# Patient Record
Sex: Male | Born: 1935 | Race: White | Hispanic: No | Marital: Married | Smoking: Former smoker
Health system: Southern US, Community
[De-identification: ages and names within clinical notes are randomized; demographics above are authoritative.]

## PROBLEM LIST (undated history)

## (undated) DIAGNOSIS — R634 Abnormal weight loss: Secondary | ICD-10-CM

## (undated) DIAGNOSIS — K219 Gastro-esophageal reflux disease without esophagitis: Secondary | ICD-10-CM

## (undated) DIAGNOSIS — N400 Enlarged prostate without lower urinary tract symptoms: Secondary | ICD-10-CM

## (undated) DIAGNOSIS — K259 Gastric ulcer, unspecified as acute or chronic, without hemorrhage or perforation: Secondary | ICD-10-CM

## (undated) DIAGNOSIS — R251 Tremor, unspecified: Secondary | ICD-10-CM

## (undated) DIAGNOSIS — E785 Hyperlipidemia, unspecified: Secondary | ICD-10-CM

## (undated) DIAGNOSIS — I10 Essential (primary) hypertension: Secondary | ICD-10-CM

## (undated) DIAGNOSIS — G43909 Migraine, unspecified, not intractable, without status migrainosus: Secondary | ICD-10-CM

## (undated) DIAGNOSIS — M1990 Unspecified osteoarthritis, unspecified site: Secondary | ICD-10-CM

## (undated) DIAGNOSIS — J189 Pneumonia, unspecified organism: Secondary | ICD-10-CM

## (undated) DIAGNOSIS — N210 Calculus in bladder: Secondary | ICD-10-CM

## (undated) DIAGNOSIS — R319 Hematuria, unspecified: Secondary | ICD-10-CM

## (undated) DIAGNOSIS — T463X1A Poisoning by coronary vasodilators, accidental (unintentional), initial encounter: Secondary | ICD-10-CM

## (undated) DIAGNOSIS — I350 Nonrheumatic aortic (valve) stenosis: Secondary | ICD-10-CM

## (undated) DIAGNOSIS — N3090 Cystitis, unspecified without hematuria: Secondary | ICD-10-CM

## (undated) HISTORY — DX: Nonrheumatic aortic (valve) stenosis: I35.0

## (undated) HISTORY — DX: Benign prostatic hyperplasia without lower urinary tract symptoms: N40.0

## (undated) HISTORY — DX: Calculus in bladder: N21.0

## (undated) HISTORY — DX: Tremor, unspecified: R25.1

## (undated) HISTORY — DX: Abnormal weight loss: R63.4

## (undated) HISTORY — PX: CIRCUMCISION: SUR203

## (undated) HISTORY — DX: Hematuria, unspecified: R31.9

---
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
## 2011-10-07 ENCOUNTER — Ambulatory Visit (INDEPENDENT_AMBULATORY_CARE_PROVIDER_SITE_OTHER): Payer: MEDICARE | Admitting: Family Medicine

## 2011-10-07 ENCOUNTER — Encounter: Payer: Self-pay | Admitting: Family Medicine

## 2011-10-07 VITALS — BP 121/72 | HR 76 | Temp 98.0°F | Ht 66.0 in | Wt 156.0 lb

## 2011-10-07 DIAGNOSIS — N210 Calculus in bladder: Secondary | ICD-10-CM

## 2011-10-07 DIAGNOSIS — N400 Enlarged prostate without lower urinary tract symptoms: Secondary | ICD-10-CM

## 2011-10-07 DIAGNOSIS — Z0000 Encounter for general adult medical examination without abnormal findings: Secondary | ICD-10-CM

## 2011-10-07 DIAGNOSIS — R634 Abnormal weight loss: Secondary | ICD-10-CM | POA: Insufficient documentation

## 2011-10-07 DIAGNOSIS — Z298 Encounter for other specified prophylactic measures: Secondary | ICD-10-CM

## 2011-10-07 DIAGNOSIS — Z23 Encounter for immunization: Secondary | ICD-10-CM

## 2011-10-07 DIAGNOSIS — R011 Cardiac murmur, unspecified: Secondary | ICD-10-CM

## 2011-10-07 DIAGNOSIS — R319 Hematuria, unspecified: Secondary | ICD-10-CM

## 2011-10-07 DIAGNOSIS — K4020 Bilateral inguinal hernia, without obstruction or gangrene, not specified as recurrent: Secondary | ICD-10-CM | POA: Insufficient documentation

## 2011-10-07 HISTORY — DX: Benign prostatic hyperplasia without lower urinary tract symptoms: N40.0

## 2011-10-07 HISTORY — DX: Abnormal weight loss: R63.4

## 2011-10-07 HISTORY — DX: Hematuria, unspecified: R31.9

## 2011-10-07 HISTORY — DX: Calculus in bladder: N21.0

## 2011-10-07 NOTE — Addendum Note (Signed)
Addended by: Laren Boom on: 10/07/2011 11:55 AM   Modules accepted: Orders

## 2011-10-07 NOTE — Patient Instructions (Addendum)
Bring the stool cards in after picking them up from the lab, this looks for microscopic blood in the stool.  I'll let you know about your urine studies, this is looking for microscopic blood as well.  Get fasting blood work in the near future.  Lab is open from 8am-5pm.  Return in 4 weeks, I'll likely have your outside records by then.   Inguinal Hernia, Adult Muscles help keep everything in the body in its proper place. But if a weak spot in the muscles develops, something can poke through. That is called a hernia. When this happens in the lower part of the belly (abdomen), it is called an inguinal hernia. (It takes its name from a part of the body in this region called the inguinal canal.) A weak spot in the wall of muscles lets some fat or part of the small intestine bulge through. An inguinal hernia can develop at any age. Men get them more often than women. CAUSES  In adults, an inguinal hernia develops over time.  It can be triggered by:  Suddenly straining the muscles of the lower abdomen.  Lifting heavy objects.  Straining to have a bowel movement. Difficult bowel movements (constipation) can lead to this.  Constant coughing. This may be caused by smoking or lung disease.  Being overweight.  Being pregnant.  Working at a job that requires long periods of standing or heavy lifting.  Having had an inguinal hernia before. One type can be an emergency situation. It is called a strangulated inguinal hernia. It develops if part of the small intestine slips through the weak spot and cannot get back into the abdomen. The blood supply can be cut off. If that happens, part of the intestine may die. This situation requires emergency surgery. SYMPTOMS  Often, a small inguinal hernia has no symptoms. It is found when a healthcare provider does a physical exam. Larger hernias usually have symptoms.   In adults, symptoms may include:  A lump in the groin. This is easier to see when the  person is standing. It might disappear when lying down.  In men, a lump in the scrotum.  Pain or burning in the groin. This occurs especially when lifting, straining or coughing.  A dull ache or feeling of pressure in the groin.  Signs of a strangulated hernia can include:  A bulge in the groin that becomes very painful and tender to the touch.  A bulge that turns red or purple.  Fever, nausea and vomiting.  Inability to have a bowel movement or to pass gas. DIAGNOSIS  To decide if you have an inguinal hernia, a healthcare provider will probably do a physical examination.  This will include asking questions about any symptoms you have noticed.  The healthcare provider might feel the groin area and ask you to cough. If an inguinal hernia is felt, the healthcare provider may try to slide it back into the abdomen.  Usually no other tests are needed. TREATMENT  Treatments can vary. The size of the hernia makes a difference. Options include:  Watchful waiting. This is often suggested if the hernia is small and you have had no symptoms.  No medical procedure will be done unless symptoms develop.  You will need to watch closely for symptoms. If any occur, contact your healthcare provider right away.  Surgery. This is used if the hernia is larger or you have symptoms.  Open surgery. This is usually an outpatient procedure (you will not stay  overnight in a hospital). An cut (incision) is made through the skin in the groin. The hernia is put back inside the abdomen. The weak area in the muscles is then repaired by herniorrhaphy or hernioplasty. Herniorrhaphy: in this type of surgery, the weak muscles are sewn back together. Hernioplasty: a patch or mesh is used to close the weak area in the abdominal wall.  Laparoscopy. In this procedure, a surgeon makes small incisions. A thin tube with a tiny video camera (called a laparoscope) is put into the abdomen. The surgeon repairs the hernia  with mesh by looking with the video camera and using two long instruments. HOME CARE INSTRUCTIONS   After surgery to repair an inguinal hernia:  You will need to take pain medicine prescribed by your healthcare provider. Follow all directions carefully.  You will need to take care of the wound from the incision.  Your activity will be restricted for awhile. This will probably include no heavy lifting for several weeks. You also should not do anything too active for a few weeks. When you can return to work will depend on the type of job that you have.  During "watchful waiting" periods, you should:  Maintain a healthy weight.  Eat a diet high in fiber (fruits, vegetables and whole grains).  Drink plenty of fluids to avoid constipation. This means drinking enough water and other liquids to keep your urine clear or pale yellow.  Do not lift heavy objects.  Do not stand for long periods of time.  Quit smoking. This should keep you from developing a frequent cough. SEEK MEDICAL CARE IF:   A bulge develops in your groin area.  You feel pain, a burning sensation or pressure in the groin. This might be worse if you are lifting or straining.  You develop a fever of more than 100.5 F (38.1 C). SEEK IMMEDIATE MEDICAL CARE IF:   Pain in the groin increases suddenly.  A bulge in the groin gets bigger suddenly and does not go down.  For men, there is sudden pain in the scrotum. Or, the size of the scrotum increases.  A bulge in the groin area becomes red or purple and is painful to touch.  You have nausea or vomiting that does not go away.  You feel your heart beating much faster than normal.  You cannot have a bowel movement or pass gas.  You develop a fever of more than 102.0 F (38.9 C). Document Released: 05/08/2008 Document Revised: 03/14/2011 Document Reviewed: 05/08/2008 [facility] Patient Information 2013 [place], [place].

## 2011-10-07 NOTE — Progress Notes (Signed)
CC: Roberto VOLLBRECHT Sr. is a 78 y.o. male is here for Establish Care and Weight Loss   Subjective: HPI:  Pleasant 78 year old here to establish care accompanied by his son, recently relocated to [place], living with his son, wife, and daughter-in-law.  They're unsure of his entire past medical history that includes BPH, osteoarthritis, and inguinal hernias.  He mentions that he's lost 30-40 pounds since August which is the most part been unintentional. He states his diet has been greatly changed as he's cut out sweets and treats it has been irregularly eating during the transition from [place] and since she's been living with his son. He denies any change in exercise habits. Overall he states his energy level is great and denies any exacerbation of his chronic osteoarthritis. He denies any new pain over the past months. He denies any discomfort when eating. There's been no diarrhea or constipation or abdominal bloating.  Schedule he mentions that about 6 weeks ago he had 16 hours of gross hematuria that resolved after stopping Celebrex. He tells me he carries a diagnosis of BPH but has never had a biopsy, he believes she's had many studies to rule out cancer of the prostate is unsure of what these studies were. He tells me at night he gets up multiple times to the often having trouble initiating a stream and occasionally has a burning sensation when urinating. During the day he denies any straining, sensation of incomplete voiding, nor dysuria. Tells me has a history of stones in his bladder but has never had nephrolithiasis to his knowledge.  To 3 months ago he noticed a bulge in his left groin that would increase when lifting weights or straining such as when having a bowel movement. Soon after noticing that a bulge involved in his left groin which she states comes and goes based on how active he is during the day. He is able to reduce this without difficulty but since he has no pain from either  side he rarely manipulates these bulges. He denies testicular pain nor enlargement or scrotal swelling. He was told that his former physician's office that he might have a hernia but was unable to find a surgeon that would operate on him before the move.  Patient denies fevers, chills, cough, shortness of breath, orthopnea, irregular heartbeat, motor sensory disturbances, headaches, depression, anxiety, stomach pain, blood in stool, garlic stools, rashes, nor confusion.   Review Of Systems Outlined In HPI  Past Medical History  Diagnosis Date  . Unintentional weight loss 10/07/2011  . BPH (benign prostatic hyperplasia) 10/07/2011  . Hematuria 10/07/2011  . Bladder stone 10/07/2011     Family History  Problem Relation Age of Onset  . Heart attack Mother   . Stroke Mother      History  Substance Use Topics  . Smoking status: Not on file  . Smokeless tobacco: Not on file  . Alcohol Use: No     Objective: Filed Vitals:   10/07/11 1046  BP: 121/72  Pulse: 76  Temp: 98 F (36.7 C)    General: Alert and Oriented, No Acute Distress HEENT: Pupils equal, round, reactive to light. Conjunctivae clear.  External ears unremarkable, canals clear with intact TMs with appropriate landmarks.  Middle ear appears open without effusion. Pink inferior turbinates.  Moist mucous membranes, pharynx without inflammation nor lesions.  Neck supple without palpable lymphadenopathy nor abnormal masses. Lungs: Clear to auscultation bilaterally, no wheezing/ronchi/rales.  Comfortable work of breathing. Good air movement. Cardiac: Regular  rate and rhythm. Normal S1/S2.  Crescendo decrescendo systolic murmur grade 2 heard best over the apex of the heart. No carotid bruits Abdomen: Normal bowel sounds, soft and non tender without palpable masses. Genitourinary: Symmetric nontender testicles without nodularity bilaterally. Soft reducible hernia on the left greater than right but does not appear to be tracking in  the inguinal canal Extremities: No peripheral edema.  Strong peripheral pulses.  Mental Status: No depression, anxiety, nor agitation. Skin: Warm and dry.  Assessment & Plan: Roberto Day was seen today for establish care and weight loss.  Diagnoses and associated orders for this visit:  Need for prophylactic immunotherapy - Flu vaccine greater than or equal to 3yo preservative free IM  Hematuria - Urinalysis, Routine w reflex microscopic - Urine culture  Inguinal hernia, bilateral - Ambulatory referral to General Surgery  Routine health maintenance - Lipid panel - COMPLETE METABOLIC PANEL WITH GFR  Unintentional weight loss - PSA - Guiac Stool Card-TAKE HOME  Heart murmur  Expressed my concern with his unintentional weight loss in the setting of gross hematuria. He tells me a colonoscopy 3 years ago which was reportedly normal as was multiple screening colonoscopies in the past. Urinalysis and culture obtained to determine if hematuria is present and possibility of urinary tract infection. Stool cards given to screen for colorectal cancer. PSA to screen for prostate cancer. Complete blood panel ordered. Lipid panel or patient request he believes it's been years since his cholesterol checked. Urgent surgical referral for bilateral inguinal hernias discussed signs and symptoms that would require emergent evaluation. Pointed out his heart murmur which he states he was never told that he has, given that he is a symptomatic focus our efforts on the above issues before deciding whether or not to get an echocardiogram.  Return in 4 weeks once outside records are available  45 minutes spent in face-to-face visit today of which at least 90 % was counseling or coordinating care.     Return in about 4 weeks (around 11/04/2011).

## 2011-10-08 LAB — LIPID PANEL
Cholesterol: 124 mg/dL (ref 0–200)
LDL Cholesterol: 61 mg/dL (ref 0–99)
Total CHOL/HDL Ratio: 3 Ratio
VLDL: 22 mg/dL (ref 0–40)

## 2011-10-08 LAB — URINALYSIS, ROUTINE W REFLEX MICROSCOPIC
Ketones, ur: NEGATIVE mg/dL
Nitrite: NEGATIVE
Specific Gravity, Urine: 1.015 (ref 1.005–1.030)
Urobilinogen, UA: 0.2 mg/dL (ref 0.0–1.0)

## 2011-10-08 LAB — COMPLETE METABOLIC PANEL WITH GFR
ALT: 10 U/L (ref 0–53)
AST: 16 U/L (ref 0–37)
Albumin: 3.5 g/dL (ref 3.5–5.2)
Alkaline Phosphatase: 111 U/L (ref 39–117)
BUN: 21 mg/dL (ref 6–23)
Calcium: 8.8 mg/dL (ref 8.4–10.5)
Chloride: 103 mEq/L (ref 96–112)
Creat: 1.21 mg/dL (ref 0.50–1.35)
Potassium: 4.2 mEq/L (ref 3.5–5.3)

## 2011-10-08 LAB — URINALYSIS, MICROSCOPIC ONLY
Casts: NONE SEEN
Squamous Epithelial / LPF: NONE SEEN
WBC, UA: >50 WBC/hpf — AB (ref <3)

## 2011-10-08 LAB — PSA: PSA: 7.65 ng/mL — ABNORMAL HIGH (ref <=4.00)

## 2011-10-10 ENCOUNTER — Telehealth: Payer: Self-pay | Admitting: Family Medicine

## 2011-10-10 DIAGNOSIS — N400 Enlarged prostate without lower urinary tract symptoms: Secondary | ICD-10-CM

## 2011-10-10 DIAGNOSIS — M25562 Pain in left knee: Secondary | ICD-10-CM

## 2011-10-10 DIAGNOSIS — R972 Elevated prostate specific antigen [PSA]: Secondary | ICD-10-CM

## 2011-10-10 DIAGNOSIS — N390 Urinary tract infection, site not specified: Secondary | ICD-10-CM

## 2011-10-10 LAB — URINE CULTURE: Colony Count: >=100000

## 2011-10-10 NOTE — Progress Notes (Signed)
  Subjective:    Patient ID: Roberto Dupes., male    DOB: 05-Apr-1921, 78 y.o.   MRN: [id number]  HPI    Review of Systems     Objective:   Physical Exam        Assessment & Plan:  Pt's take home stool cards were negative for blood.

## 2011-10-10 NOTE — Telephone Encounter (Signed)
Contacted patient's family, he wasn't available as he's turning in his stool cards now. I'll try him again tomorrow when all his results should be back.

## 2011-10-11 ENCOUNTER — Encounter: Payer: Self-pay | Admitting: Family Medicine

## 2011-10-11 DIAGNOSIS — R972 Elevated prostate specific antigen [PSA]: Secondary | ICD-10-CM | POA: Insufficient documentation

## 2011-10-11 MED ORDER — SULFAMETHOXAZOLE-TRIMETHOPRIM 800-160 MG PO TABS: 1.0000 | ORAL_TABLET | Freq: Two times a day (BID) | ORAL | Status: AC

## 2011-10-11 NOTE — Telephone Encounter (Signed)
Contacted patient: Informed of urine studies suspicious for urinary tract infection Bactrim Rx will be placed a front desk. Patient states he was seen by a Dr. Kateri Day who is a urologist in August and given diagnosis of BPH without concern for prostate cancer. Roberto Day is open to urology appointment here in [facility] given his BPH history and a recent elevated PSA. We'll place a referral I like to get a repeat PSA after clearing his presumed urinary tract infection. He has an appointment in early November. Per his request he would like an orthopedic referral for evaluation of left knee pain.

## 2011-10-12 ENCOUNTER — Encounter: Payer: Self-pay | Admitting: *Deleted

## 2011-10-12 DIAGNOSIS — E785 Hyperlipidemia, unspecified: Secondary | ICD-10-CM | POA: Insufficient documentation

## 2011-10-14 ENCOUNTER — Telehealth: Payer: Self-pay | Admitting: *Deleted

## 2011-10-14 NOTE — Telephone Encounter (Signed)
Sounds like a foley needs to be placed, I'd bet he has a bladder outlet obstruction.  We do not have the equipment to handle that here unfortunately, therefore I'd recommend him go to a local ED to have his bladder decompressed.

## 2011-10-14 NOTE — Telephone Encounter (Signed)
Given Septra on Monday for bladder infection. Pt can not urinate at all. Last time went was 6:30pm last night. Urology appt is next Wednesday and pt states he can not wait. Denies fever and chills just

## 2011-10-14 NOTE — Telephone Encounter (Signed)
Pt notified to go to ED

## 2011-10-17 ENCOUNTER — Ambulatory Visit (INDEPENDENT_AMBULATORY_CARE_PROVIDER_SITE_OTHER): Payer: MEDICARE | Admitting: Family Medicine

## 2011-10-17 ENCOUNTER — Encounter: Payer: Self-pay | Admitting: Family Medicine

## 2011-10-17 ENCOUNTER — Other Ambulatory Visit (HOSPITAL_COMMUNITY)
Admission: RE | Admit: 2011-10-17 | Discharge: 2011-10-17 | Disposition: A | Discharge status: Home / Self Care | Payer: MEDICARE | Source: Ambulatory Visit | Attending: Family Medicine | Admitting: Family Medicine

## 2011-10-17 VITALS — BP 114/68 | HR 87 | Temp 97.6°F | Resp 21 | Wt 154.0 lb

## 2011-10-17 DIAGNOSIS — E785 Hyperlipidemia, unspecified: Secondary | ICD-10-CM

## 2011-10-17 DIAGNOSIS — R310 Gross hematuria: Secondary | ICD-10-CM | POA: Insufficient documentation

## 2011-10-17 DIAGNOSIS — N390 Urinary tract infection, site not specified: Secondary | ICD-10-CM

## 2011-10-17 DIAGNOSIS — M549 Dorsalgia, unspecified: Secondary | ICD-10-CM | POA: Insufficient documentation

## 2011-10-17 DIAGNOSIS — R911 Solitary pulmonary nodule: Secondary | ICD-10-CM

## 2011-10-17 DIAGNOSIS — H409 Unspecified glaucoma: Secondary | ICD-10-CM | POA: Insufficient documentation

## 2011-10-17 DIAGNOSIS — G8929 Other chronic pain: Secondary | ICD-10-CM

## 2011-10-17 DIAGNOSIS — M069 Rheumatoid arthritis, unspecified: Secondary | ICD-10-CM | POA: Insufficient documentation

## 2011-10-17 DIAGNOSIS — M8580 Other specified disorders of bone density and structure, unspecified site: Secondary | ICD-10-CM | POA: Insufficient documentation

## 2011-10-17 NOTE — Patient Instructions (Signed)
Take the cipro, you do not need to take the bactrim (TMP/SMX).  Take Flomax on a daily basis to help with relaxing the prostate.  Stop taking Cardura, this is probably causing lightheadedness.

## 2011-10-17 NOTE — Progress Notes (Addendum)
CC: Roberto Day. is a 78 y.o. male is here for Follow up hospital   Subjective: HPI:  Patient presents for ED followup. He was seen at [facility] this past Friday for an inability urinate and required a Foley catheter placement which she still has been placed today. I spoke with the patient earlier in the week about a staph urinary tract infection and he was approximately day 3 of a Bactrim regimen. He brings labs from the hospital showed a hemoglobin of 13.2 platelets of 214 and a white count of 10.3. Urine microscopy shows 3-6 red blood cells on high-power field compared to the 0-2 report that I had obtained earlier in the week. CT renal stone study was performed showing an enlarged prostate and a noncalcified pleural-based right pulmonary nodule a 3 mm size. He was started on Cipro twice a day and Flomax. He tells me that soon after taking the Flomax he often feels lightheaded, he also points out that he's been taking Cardura that's not initially on his med list. He's noticed that his urine became red and brown sometime around Sunday. He states this is not uncommon for him to have bloody urine and that it'll likely go away as it has in the past without any intervention. He tells me he seen urologist in the past for this and everything "checked out ". He has an appointment with urology later this month and it set up do to an elevated PSA with a history of unintentional weight loss in the setting of episodic gross hematuria.  He denies shortness of breath, chest pain, irregular heartbeat, bleeding from the penile meatus, testicular pain, groin pain, fevers, chills, nausea, bowel irregularities. He does note some penile pain but states this is only present when the Foley is manipulated.   Review Of Systems Outlined In HPI  Past Medical History  Diagnosis Date  . Unintentional weight loss 10/07/2011  . BPH (benign prostatic hyperplasia) 10/07/2011  . Hematuria 10/07/2011  . Bladder  stone 10/07/2011     Family History  Problem Relation Age of Onset  . Heart attack Mother   . Stroke Mother      History  Substance Use Topics  . Smoking status: Not on file  . Smokeless tobacco: Not on file  . Alcohol Use: No     Objective: Filed Vitals:   10/17/11 0955  BP: 114/68  Pulse: 87  Temp: 97.6 F (36.4 C)  Resp: 21    General: Alert and Oriented, No Acute Distress HEENT: Pupils equal, round, reactive to light. Conjunctivae clear.   Moist mucous membranes, Lungs: Clear to auscultation bilaterally, Comfortable work of breathing. Good air movement. Cardiac: Regular rate and rhythm. Normal S1/S2.  No murmurs, rubs, nor gallops.   Extremities: No peripheral edema.  Strong peripheral pulses.  Foley bag attached to the right leg containing brown/red urine without gross evidence of clots. Mental Status: No depression, anxiety, nor agitation. Skin: Warm and dry.  Assessment & Plan: Gevork was seen today for follow up hospital.  Diagnoses and associated orders for this visit:  Pulmonary nodule, left  Uti (lower urinary tract infection)  Gross hematuria - Cytology - non gyn  Pulmonary nodule seen on imaging study (repeat ct oct 2014)  Other Orders - ciprofloxacin (CIPRO) 500 MG tablet; Take 500 mg by mouth 2 (two) times daily. - Tamsulosin HCl (FLOMAX) 0.4 MG CAPS; Take 0.4 mg by mouth daily.    Patient initially requested Foley removed however since  we don't have a replacement Foley if he fails a trial without Foley and he would have to go to the emergency room he prefer to wait until urology appointment which we arranged for this coming Friday. Stressed importance of him continuing Cipro, he can stop Bactrim. He should continue Flomax but should stop Cardura do to what I suspect is some mild hypertension. I have sent his urine for cytology given his elevated PSA, hematuria, unintentional weight loss.Signs and symptoms requring emergent/urgent reevaluation were  discussed with the patient.  25 minutes spent in face-to-face visit today of which at least 75% was counseling or coordinating care.   Return in about 4 weeks (around 11/14/2011).

## 2011-10-19 ENCOUNTER — Ambulatory Visit (INDEPENDENT_AMBULATORY_CARE_PROVIDER_SITE_OTHER): Payer: MEDICARE

## 2011-10-19 ENCOUNTER — Other Ambulatory Visit: Payer: Self-pay | Admitting: Sports Medicine

## 2011-10-19 DIAGNOSIS — M25562 Pain in left knee: Secondary | ICD-10-CM

## 2011-10-19 DIAGNOSIS — M25569 Pain in unspecified knee: Secondary | ICD-10-CM

## 2011-10-21 ENCOUNTER — Encounter: Payer: Self-pay | Admitting: Family Medicine

## 2011-10-24 ENCOUNTER — Ambulatory Visit (INDEPENDENT_AMBULATORY_CARE_PROVIDER_SITE_OTHER): Payer: MEDICARE | Admitting: Surgery

## 2011-11-02 ENCOUNTER — Ambulatory Visit (INDEPENDENT_AMBULATORY_CARE_PROVIDER_SITE_OTHER): Payer: MEDICARE | Admitting: Surgery

## 2011-11-02 ENCOUNTER — Encounter (INDEPENDENT_AMBULATORY_CARE_PROVIDER_SITE_OTHER): Payer: Self-pay | Admitting: Surgery

## 2011-11-02 VITALS — BP 123/70 | HR 88 | Temp 97.6°F | Resp 18 | Ht 67.0 in | Wt 167.4 lb

## 2011-11-02 DIAGNOSIS — K4020 Bilateral inguinal hernia, without obstruction or gangrene, not specified as recurrent: Secondary | ICD-10-CM

## 2011-11-02 NOTE — Progress Notes (Signed)
Patient ID: Roberto Day., male   DOB: 12/05/1921, 78 y.o.   MRN: [id number]  Chief Complaint  Patient presents with  . Hernia    HPI Roberto CLOVER Sr. is a 78 y.o. male.  Referred by Dr. Ivan Anchors for evaluation of bilateral inguinal hernias HPI This is an 78 year old male who recently moved to [place] from [place]. Prior to his move, he was diagnosed with bilateral inguinal hernias. In retrospect he has had a left inguinal hernia for at least 3 years. The left side has become larger and mildly uncomfortable. He had an emergency department visit in August with a right inguinal hernia that sounds like it was incarcerated. It was successfully reduced. He has established new care here in [place] with Dr. Ivan Anchors who refers him for evaluation for hernia repair. Previously the patient was quite active and enjoys playing golf. He has had a lot of problems with his left knee as well as his hernias which is keeping him from playing golf. He is fairly independent. He denies any obstructive symptoms. He has had some problems with urinary retention but this seems to be improved.  Past Medical History  Diagnosis Date  . Unintentional weight loss 10/07/2011  . BPH (benign prostatic hyperplasia) 10/07/2011  . Hematuria 10/07/2011  . Bladder stone 10/07/2011    PSH:  Circumcision Ear surgery   Family History  Problem Relation Age of Onset  . Heart attack Mother   . Stroke Mother     Social History History  Substance Use Topics  . Smoking status: Former Games developer  . Smokeless tobacco: Not on file  . Alcohol Use: Yes     rarly beer    Allergies  Allergen Reactions  . Codeine Nausea Only    Current Outpatient Prescriptions  Medication Sig Dispense Refill  . betaxolol (BETOPTIC-S) 0.25 % ophthalmic suspension Place 1 drop into both eyes 2 (two) times daily.      . celecoxib (CELEBREX) 200 MG capsule Take 200 mg by mouth daily as needed.      . ciprofloxacin (CIPRO) 500 MG  tablet Take 500 mg by mouth 2 (two) times daily.      Marland Kitchen gabapentin (NEURONTIN) 300 MG capsule Take 300 mg by mouth 3 (three) times daily.      Marland Kitchen HYDROcodone-acetaminophen (VICODIN) 5-500 MG per tablet Take 1 tablet by mouth every 6 (six) hours as needed.      . Multiple Vitamins-Minerals (CENTRUM SILVER PO) Take by mouth.      . Naproxen Sodium (ALEVE PO) Take by mouth.      . Potassium Gluconate 550 MG TABS Take 1 tablet by mouth daily.      Marland Kitchen RANITIDINE HCL PO Take by mouth.      . Saw Palmetto, Serenoa repens, (SAW PALMETTO PO) Take by mouth.      . Tamsulosin HCl (FLOMAX) 0.4 MG CAPS Take 0.4 mg by mouth daily.      . traMADol (ULTRAM) 50 MG tablet Take 50 mg by mouth every 6 (six) hours as needed.        Review of Systems Review of Systems  Constitutional: Positive for unexpected weight change. Negative for fever and chills.  HENT: Positive for hearing loss. Negative for congestion, sore throat, trouble swallowing and voice change.   Eyes: Negative for visual disturbance.  Respiratory: Negative for cough and wheezing.   Cardiovascular: Positive for leg swelling. Negative for chest pain and palpitations.  Gastrointestinal: Negative for nausea, vomiting,  abdominal pain, diarrhea, constipation, blood in stool, abdominal distention, anal bleeding and rectal pain.  Genitourinary: Negative for hematuria and difficulty urinating.  Musculoskeletal: Positive for joint swelling and arthralgias.  Skin: Negative for rash and wound.  Neurological: Negative for seizures, syncope, weakness and headaches.  Hematological: Negative for adenopathy. Does not bruise/bleed easily.  Psychiatric/Behavioral: Negative for confusion.    Blood pressure 123/70, pulse 88, temperature 97.6 F (36.4 C), temperature source Temporal, resp. rate 18, height 5\' 7"  (1.702 m), weight 167 lb 6.4 oz (75.932 kg).  Physical Exam Physical Exam Elderly male in NAD HEENT:  EOMI, sclera anicteric Neck:  No masses, no  thyromegaly Lungs:  CTA bilaterally; normal respiratory effort CV:  Regular rate and rhythm; heart murmur Abd:  +bowel sounds, soft, non-tender, no masses GU: bilateral descended testes; no testicular masses; large left inguinal hernia - reducible; moderate-sized right inguinal hernia - reducible Ext:  Well-perfused; no edema Skin:  Warm, dry; no sign of jaundice  Data Reviewed none  Assessment    Bilateral inguinal hernias    Plan    Bilateral inguinal hernia repairs with mesh.  The surgical procedure has been discussed with the patient.  Potential risks, benefits, alternative treatments, and expected outcomes have been explained.  All of the patient's questions at this time have been answered.  The likelihood of reaching the patient's treatment goal is good.  The patient understand the proposed surgical procedure and wishes to proceed. We will plan on an overnight stay due to his age.       [name] 11/02/2011, 5:55 PM

## 2011-11-04 ENCOUNTER — Encounter: Payer: Self-pay | Admitting: Family Medicine

## 2011-11-04 ENCOUNTER — Ambulatory Visit (INDEPENDENT_AMBULATORY_CARE_PROVIDER_SITE_OTHER): Payer: MEDICARE | Admitting: Family Medicine

## 2011-11-04 VITALS — BP 129/72 | HR 72 | Ht 66.0 in | Wt 168.0 lb

## 2011-11-04 DIAGNOSIS — M899 Disorder of bone, unspecified: Secondary | ICD-10-CM

## 2011-11-04 DIAGNOSIS — Z0000 Encounter for general adult medical examination without abnormal findings: Secondary | ICD-10-CM

## 2011-11-04 DIAGNOSIS — H9190 Unspecified hearing loss, unspecified ear: Secondary | ICD-10-CM

## 2011-11-04 DIAGNOSIS — M8580 Other specified disorders of bone density and structure, unspecified site: Secondary | ICD-10-CM

## 2011-11-04 MED ORDER — ATORVASTATIN CALCIUM 10 MG PO TABS: 10.0000 mg | ORAL_TABLET | Freq: Every day | ORAL | Status: DC

## 2011-11-04 MED ORDER — BETAXOLOL HCL 0.25 % OP SUSP: 1.0000 [drp] | Freq: Two times a day (BID) | OPHTHALMIC | Status: AC

## 2011-11-04 MED ORDER — POTASSIUM GLUCONATE 550 MG PO TABS: 1.0000 | ORAL_TABLET | Freq: Every day | ORAL | Status: AC

## 2011-11-04 MED ORDER — TAMSULOSIN HCL 0.4 MG PO CAPS: 0.4000 mg | ORAL_CAPSULE | Freq: Every day | ORAL | Status: DC

## 2011-11-04 MED ORDER — HYDROCODONE-ACETAMINOPHEN 5-500 MG PO TABS: 1.0000 | ORAL_TABLET | Freq: Four times a day (QID) | ORAL | Status: DC | PRN

## 2011-11-04 MED ORDER — GABAPENTIN 300 MG PO CAPS: 300.0000 mg | ORAL_CAPSULE | Freq: Three times a day (TID) | ORAL | Status: DC

## 2011-11-04 NOTE — Progress Notes (Signed)
Subjective:    Roberto BINFORD Sr. is a 78 y.o. male who presents for Medicare Annual/Subsequent preventive examination.   Preventive Screening-Counseling & Management  Tobacco History  Smoking status  . Former Smoker  Smokeless tobacco  . Not on file    Problems Prior to Visit 1.  See Below  Current Problems (verified) Patient Active Problem List  Diagnosis  . Hematuria  . Inguinal hernia, bilateral  . Unintentional weight loss  . Osteoarthritis  . BPH (benign prostatic hyperplasia)  . Bladder stone  . Heart murmur  . Elevated PSA (6.1 05/2011), (6.5 07/2011)  . Left knee pain  . Other and unspecified hyperlipidemia  . Pulmonary nodule, left  . UTI (lower urinary tract infection)  . Pulmonary nodule seen on imaging study (Repeat CT Oct 2014)  . Hyperlipidemia  . Osteopenia (Femur T= -1.7 09/2009)  . Chronic back pain  . Rheumatoid arthritis (per outside records)  . Glaucoma  . CKD (chronic kidney disease) stage 3, GFR 30-59 ml/min    Medications Prior to Visit Current Outpatient Prescriptions on File Prior to Visit  Medication Sig Dispense Refill  . atorvastatin (LIPITOR) 10 MG tablet Take 1 tablet (10 mg total) by mouth daily.  90 tablet  1  . ciprofloxacin (CIPRO) 500 MG tablet Take 500 mg by mouth 2 (two) times daily.      Marland Kitchen doxazosin (CARDURA) 8 MG tablet Take 8 mg by mouth at bedtime.      . Multiple Vitamins-Minerals (CENTRUM SILVER PO) Take by mouth.      . Naproxen Sodium (ALEVE PO) Take by mouth.      Marland Kitchen RANITIDINE HCL PO Take by mouth.      . Saw Palmetto, Serenoa repens, (SAW PALMETTO PO) Take by mouth.      . traMADol (ULTRAM) 50 MG tablet Take 50 mg by mouth every 6 (six) hours as needed.      Marland Kitchen DISCONTD: gabapentin (NEURONTIN) 300 MG capsule Take 300 mg by mouth 3 (three) times daily.      Marland Kitchen DISCONTD: Potassium Gluconate 550 MG TABS Take 1 tablet by mouth daily.      . celecoxib (CELEBREX) 200 MG capsule Take 200 mg by mouth daily as needed.         Current Medications (verified) Current Outpatient Prescriptions  Medication Sig Dispense Refill  . atorvastatin (LIPITOR) 10 MG tablet Take 1 tablet (10 mg total) by mouth daily.  90 tablet  1  . betaxolol (BETOPTIC-S) 0.25 % ophthalmic suspension Place 1 drop into both eyes 2 (two) times daily.  5 mL  3  . ciprofloxacin (CIPRO) 500 MG tablet Take 500 mg by mouth 2 (two) times daily.      Marland Kitchen doxazosin (CARDURA) 8 MG tablet Take 8 mg by mouth at bedtime.      . gabapentin (NEURONTIN) 300 MG capsule Take 1 capsule (300 mg total) by mouth 3 (three) times daily.  90 capsule  6  . HYDROcodone-acetaminophen (VICODIN) 5-500 MG per tablet Take 1 tablet by mouth every 6 (six) hours as needed.  60 tablet  0  . Multiple Vitamins-Minerals (CENTRUM SILVER PO) Take by mouth.      . Naproxen Sodium (ALEVE PO) Take by mouth.      . Potassium Gluconate 550 MG TABS Take 1 tablet (550 mg total) by mouth daily.  90 each  1  . RANITIDINE HCL PO Take by mouth.      . Saw Palmetto, Serenoa repens, (SAW  PALMETTO PO) Take by mouth.      . Tamsulosin HCl (FLOMAX) 0.4 MG CAPS Take 1 capsule (0.4 mg total) by mouth daily.  90 capsule  1  . traMADol (ULTRAM) 50 MG tablet Take 50 mg by mouth every 6 (six) hours as needed.      Marland Kitchen DISCONTD: atorvastatin (LIPITOR) 10 MG tablet Take 10 mg by mouth daily.      Marland Kitchen DISCONTD: gabapentin (NEURONTIN) 300 MG capsule Take 300 mg by mouth 3 (three) times daily.      Marland Kitchen DISCONTD: Potassium Gluconate 550 MG TABS Take 1 tablet by mouth daily.      . celecoxib (CELEBREX) 200 MG capsule Take 200 mg by mouth daily as needed.         Allergies (verified) Codeine   PAST HISTORY  Family History Family History  Problem Relation Age of Onset  . Heart attack Mother   . Stroke Mother     Social History History  Substance Use Topics  . Smoking status: Former Games developer  . Smokeless tobacco: Not on file  . Alcohol Use: Yes     rarly beer    Are there smokers in your home (other  than you)?  No  Risk Factors Current exercise habits: The patient does not participate in regular exercise at present.  Dietary issues discussed: Low Salt and Fat Diet  Cardiac risk factors: advanced age (older than 67 for men, 55 for women), male gender and sedentary lifestyle.  Depression Screen (Note: if answer to either of the following is "Yes", a more complete depression screening is indicated)   Q1: Over the past two weeks, have you felt down, depressed or hopeless? No  Q2: Over the past two weeks, have you felt little interest or pleasure in doing things? No  Have you lost interest or pleasure in daily life? No  Do you often feel hopeless? No  Do you cry easily over simple problems? No  Activities of Daily Living In your present state of health, do you have any difficulty performing the following activities?:  Driving? No Managing money?  No Feeding yourself? No Getting from bed to chair? No Climbing a flight of stairs? No Preparing food and eating?: No Bathing or showering? No Getting dressed: No Getting to the toilet? No Using the toilet:No Moving around from place to place: No In the past year have you fallen or had a near fall?:No   Are you sexually active?  No  Do you have more than one partner?  No  Hearing Difficulties: Yes Do you often ask people to speak up or repeat themselves? Yes Do you experience ringing or noises in your ears? No Do you have difficulty understanding soft or whispered voices? Yes   Do you feel that you have a problem with memory? Yes  Do you often misplace items? No  Do you feel safe at home?  Yes  Cognitive Testing  Clock Draw: Perfect  Three Word Recall: Perfect  Advanced Directives have been discussed with the patient? Yes   List the Names of Other Physician/Practitioners you currently use: 1.  Dr. Penni Bombard - Ortho 2. Dr. Lucia Gaskins - Urology Please see snapshot for additional physicians  Indicate any recent Medical Services  you may have received from other than [facility] providers in the past year (date may be approximate).  Immunization History  Administered Date(s) Administered  . Influenza Split 10/07/2011  . Pneumococcal Conjugate 01/04/2003  . Td 01/04/2007    Screening Tests  Health Maintenance  Topic Date Due  . Tetanus/tdap  09/04/1942  . Colonoscopy  09/03/1973  . Zostavax  09/04/1983  . Pneumococcal Polysaccharide Vaccine Age 51 And Over  09/03/1988  . Influenza Vaccine  09/03/2012    All answers were reviewed with the patient and necessary referrals were made:  Laren Boom, DO   11/04/2011   History reviewed: allergies, current medications, past family history, past medical history, past social history, past surgical history and problem list  Review of Systems Review of Systems - General ROS: negative for - chills, fever, night sweats, weight gain or weight loss Ophthalmic ROS: negative for - decreased vision Psychological ROS: negative for - anxiety or depression ENT ROS: negative for -nasal congestion, tinnitus or allergies Hematological and Lymphatic ROS: negative for - bleeding problems, bruising or swollen lymph nodes Breast ROS: negative Respiratory ROS: no cough, shortness of breath, or wheezing Cardiovascular ROS: no chest pain or dyspnea on exertion Gastrointestinal ROS: no abdominal pain, change in bowel habits, or black or bloody stools Genito-Urinary ROS: negative for - genital discharge, genital ulcers, incontinence or abnormal bleeding from genitals Musculoskeletal ROS: negative for - joint pain or muscle pain Neurological ROS: negative for - headaches or memory loss Dermatological ROS: negative for lumps, mole changes, rash and skin lesion changes   Objective:     Vision by Snellen chart: right eye:20/20, left eye:20/20 Blood pressure 129/72, pulse 72, height 5\' 6"  (1.676 m), weight 168 lb (76.204 kg), SpO2 99.00%. Body mass index is 27.12 kg/(m^2).  General: No Acute  Distress HEENT: Atraumatic, normocephalic, conjunctivae normal without scleral icterus.  No nasal discharge, hearing grossly intact, TMs with good landmarks bilaterally with no middle ear abnormalities, posterior pharynx clear without oral lesions. Neck: Supple, trachea midline, no cervical nor supraclavicular adenopathy. Pulmonary: Clear to auscultation bilaterally without wheezing, rhonchi, nor rales. Cardiac: Regular rate and rhythm.  No murmurs, rubs, nor gallops. No peripheral edema.  2+ peripheral pulses bilaterally. Abdomen: Bowel sounds normal.  No masses.  Non-tender without rebound.  Negative Murphy's sign. MSK: Grossly intact, no signs of weakness.  Full strength throughout upper and lower extremities.  Full ROM in upper and lower extremities.  No midline spinal tenderness. Neuro: Gait unremarkable, CN II-XII grossly intact.  C5-C6 Reflex 2/4 Bilaterally, L4 Reflex 2/4 Bilaterally.  Cerebellar function intact. Skin: No rashes. Psych: Alert and oriented to person/place/time.  Thought process normal. No anxiety/depression.     Assessment:    Hearing Loss, Hx of osteopenia     Plan:     During the course of the visit the patient was educated and counseled about appropriate screening and preventive services including:    Is currently seeing urology for prostate management, no indication for colonoscopy noting his age, we did order a DEXA scan given his history of osteopenia in 2011. He started his flu shot, he already had pneumonia vaccine after 65, he is positive his tetanus booster was within the last 10 years. Audiology referral for maintenance of his hearing aids  Diet review for nutrition referral?  Not Indicated   Depression screen negative, no need for further testing.  Patient Instructions (the written plan) was given to the patient.  Medicare Attestation I have personally reviewed: The patient's medical and social history Their use of alcohol, tobacco or illicit  drugs Their current medications and supplements The patient's functional ability including ADLs,fall risks, home safety risks, cognitive, and hearing and visual impairment Diet and physical activities Evidence for depression or mood disorders  The patient's weight, height, BMI, and visual acuity have been recorded in the chart.  I have made referrals, counseling, and provided education to the patient based on review of the above and I have provided the patient with a written personalized care plan for preventive services.     Laren Boom, DO   11/04/2011

## 2011-11-04 NOTE — Patient Instructions (Signed)
You will be receiving a phone call regarding the scheduling of the following  Audiology For Hearing Aid Adjustment  Bone Density Testing       Dr. Genelle Bal General Advice Following Your Complete Physical Exam  The Benefits of Regular Exercise: Unless you suffer from an uncontrolled cardiovascular condition, studies strongly suggest that regular exercise and physical activity will add to both the quality and length of your life.  The World Health Organization recommends 150 minutes of moderate intensity aerobic activity every week.  This is best split over 3-4 days a week, and can be as simple as a brisk walk for just over 35 minutes "most days of the week".  This type of exercise has been shown to lower LDL-Cholesterol, lower average blood sugars, lower blood pressure, lower cardiovascular disease risk, improve memory, and increase one's overall sense of wellbeing.  The addition of anaerobic (or "strength training") exercises offers additional benefits including but not limited to increased metabolism, prevention of osteoporosis, and improved overall cholesterol levels.  How Can I Strive For A Low-Fat Diet?: Current guidelines recommend that 25-35 percent of your daily energy (food) intake should come from fats.  One might ask how can this be achieved without having to dissect each meal on a daily basis?  Switch to skim or 1% milk instead of whole milk.  Focus on lean meats such as ground [place], fresh fish, baked chicken, and lean cuts of beef as your source of dietary protein.  Consume less than 300mg /day of dietary cholesterol.  Limit trans fatty acid consumption primarily by limiting synthetic trans fats such as partially hydrogenated oils (Ex: fried fast foods).  Focus efforts on reducing your intake of "solid" fats (Ex: Butter).  Substitute olive or vegetable oil for solid fats where possible.  Moderation of Salt Intake: Provided you don't carry a diagnosis of congestive heart  failure nor renal failure, I recommend a daily allowance of no more than 2300 mg of salt (sodium).  Keeping under this daily goal is associated with a decreased risk of cardiovascular events, creeping above it can lead to elevated blood pressures and increases your risk of cardiovascular events.  Milligrams (mg) of salt is listed on all nutrition labels, and your daily intake can add up faster than you think.  Most canned and frozen dinners can pack in over half your daily salt allowance in one meal.    Lifestyle Health Risks: Certain lifestyle choices carry specific health risks.  As you may already know, tobacco use has been associated with increasing one's risk of cardiovascular disease, pulmonary disease, numerous cancers, among many other issues.  What you may not know is that there are medications and nicotine replacement strategies that can more than double your chances of successfully quitting.  I would be thrilled to help manage your quitting strategy if you currently use tobacco products.  When it comes to alcohol use, I've yet to find an "ideal" daily allowance.  Provided an individual does not have a medical condition that is exacerbated by alcohol consumption, general guidelines determine "safe drinking" as no more than two standard drinks for a man or no more than one standard drink for a male per day.  However, much debate still exists on whether any amount of alcohol consumption is technically "safe".  My general advice, keep alcohol consumption to a minimum for general health promotion.  If you or others believe that alcohol, tobacco, or recreational drug use is interfering with your life, I would be happy  to provide confidential counseling regarding treatment options.  General "Over The Counter" Nutrition Advice: Postmenopausal women should aim for a daily calcium intake of 1200 mg, however a significant portion of this might already be provided by diets including milk, yogurt, cheese, and  other dairy products.  Vitamin D has been shown to help preserve bone density, prevent fatigue, and has even been shown to help reduce falls in the elderly.  Ensuring a daily intake of 800 Units of Vitamin D is a good place to start to enjoy the above benefits, we can easily check your Vitamin D level to see if you'd potentially benefit from supplementation beyond 800 Units a day.  Folic Acid intake should be of particular concern to women of childbearing age.  Daily consumption of 400-800 mcg of Folic Acid is recommended to minimize the chance of spinal cord defects in a fetus should pregnancy occur.    For many adults, accidents still remain one of the most common culprits when it comes to cause of death.  Some of the simplest but most effective preventitive habits you can adopt include regular seatbelt use, proper helmet use, securing firearms, and regularly testing your smoke and carbon monoxide detectors.  Roberto Day B. [facility] DO Med [facility] [address], [address] [place], [place] [address] Phone: [phone]

## 2011-11-07 ENCOUNTER — Telehealth: Payer: Self-pay | Admitting: Family Medicine

## 2011-11-07 MED ORDER — HYDROCODONE-ACETAMINOPHEN 5-500 MG PO TABS: 1.0000 | ORAL_TABLET | Freq: Four times a day (QID) | ORAL | Status: DC | PRN

## 2011-11-07 NOTE — Telephone Encounter (Signed)
Patient's daughter-n-law called left a voicemail stating that patient was seen on Friday 11/04/11 and there has been a mix up with a couple of his meds and he wants to speak with you specifically. Thanks  (p) [phone]

## 2011-11-07 NOTE — Telephone Encounter (Signed)
Sue Lush, will you please call in the hydrocodone rx associated with this encounter, mr. Delancey states that he takes this four times a day every day.

## 2011-11-07 NOTE — Telephone Encounter (Signed)
rx has been called in

## 2011-11-09 ENCOUNTER — Encounter: Payer: Self-pay | Admitting: Family Medicine

## 2011-11-09 ENCOUNTER — Encounter: Payer: Self-pay | Admitting: *Deleted

## 2011-11-15 ENCOUNTER — Ambulatory Visit (INDEPENDENT_AMBULATORY_CARE_PROVIDER_SITE_OTHER): Payer: MEDICARE

## 2011-11-15 DIAGNOSIS — M949 Disorder of cartilage, unspecified: Secondary | ICD-10-CM

## 2011-11-15 DIAGNOSIS — M8580 Other specified disorders of bone density and structure, unspecified site: Secondary | ICD-10-CM

## 2011-11-16 ENCOUNTER — Encounter: Payer: Self-pay | Admitting: Family Medicine

## 2011-11-21 ENCOUNTER — Encounter (HOSPITAL_COMMUNITY): Payer: Self-pay | Admitting: Respiratory Therapy

## 2011-11-25 ENCOUNTER — Encounter (HOSPITAL_COMMUNITY): Payer: Self-pay

## 2011-11-25 ENCOUNTER — Encounter (HOSPITAL_COMMUNITY)
Admission: RE | Admit: 2011-11-25 | Discharge: 2011-11-25 | Disposition: A | Discharge status: Home / Self Care | Payer: Medicare Other | Source: Ambulatory Visit | Attending: Surgery | Admitting: Surgery

## 2011-11-25 ENCOUNTER — Ambulatory Visit (HOSPITAL_COMMUNITY)
Admission: RE | Admit: 2011-11-25 | Discharge: 2011-11-25 | Disposition: A | Discharge status: Home / Self Care | Payer: Medicare Other | Source: Ambulatory Visit | Attending: Anesthesiology | Admitting: Anesthesiology

## 2011-11-25 DIAGNOSIS — Z01818 Encounter for other preprocedural examination: Secondary | ICD-10-CM | POA: Insufficient documentation

## 2011-11-25 HISTORY — DX: Cystitis, unspecified without hematuria: N30.90

## 2011-11-25 HISTORY — DX: Unspecified osteoarthritis, unspecified site: M19.90

## 2011-11-25 HISTORY — DX: Hyperlipidemia, unspecified: E78.5

## 2011-11-25 HISTORY — DX: Essential (primary) hypertension: I10

## 2011-11-25 HISTORY — DX: Gastro-esophageal reflux disease without esophagitis: K21.9

## 2011-11-25 LAB — BASIC METABOLIC PANEL
Chloride: 105 mEq/L (ref 96–112)
Creatinine, Ser: 1.08 mg/dL (ref 0.50–1.35)
GFR calc Af Amer: 69 mL/min — ABNORMAL LOW (ref >90)
Potassium: 4.4 mEq/L (ref 3.5–5.1)
Sodium: 139 mEq/L (ref 135–145)

## 2011-11-25 LAB — CBC
HCT: 38.9 % — ABNORMAL LOW (ref 39.0–52.0)
Hemoglobin: 12.6 g/dL — ABNORMAL LOW (ref 13.0–17.0)
RBC: 4.37 MIL/uL (ref 4.22–5.81)
RDW: 14.5 % (ref 11.5–15.5)
WBC: 7.5 10*3/uL (ref 4.0–10.5)

## 2011-11-25 LAB — SURGICAL PCR SCREEN: Staphylococcus aureus: NEGATIVE

## 2011-11-25 MED ORDER — CHLORHEXIDINE GLUCONATE 4 % EX LIQD: 1.0000 "application " | Freq: Once | CUTANEOUS | Status: DC

## 2011-11-25 NOTE — Pre-Procedure Instructions (Signed)
20 PARRY LEGATE Sr.  11/25/2011   Your procedure is scheduled on:  Monday December 05, 2011  Report to [facility] at 10:00 AM.  Call this number if you have problems the morning of surgery: [phone]   Remember:   Do not eat food or drink :After Midnight.      Take these medicines the morning of surgery with A SIP OF WATER: betaxolol, gabapentin, vicodin, ranitidine, tramadol, flomax, DISCONTINUE NSAIDS, ASPIRIN, COUMADIN, PLAVIX, EFFIENT, AND HERBAL MEDICATIONS.   Do not wear jewelry, make-up or nail polish.  Do not wear lotions, powders, or perfumes.  Do not shave 48 hours prior to surgery. Men may shave face and neck.  Do not bring valuables to the hospital.  Contacts, dentures or bridgework may not be worn into surgery.  Leave suitcase in the car. After surgery it may be brought to your room.  For patients admitted to the hospital, checkout time is 11:00 AM the day of discharge.   Patients discharged the day of surgery will not be allowed to drive home.  Name and phone number of your driver: family / friend  Special Instructions: Shower using CHG 2 nights before surgery and the night before surgery.  If you shower the day of surgery use CHG.  Use special wash - you have one bottle of CHG for all showers.  You should use approximately 1/3 of the bottle for each shower.   Please read over the following fact sheets that you were given: Pain Booklet, Coughing and Deep Breathing, MRSA Information and Surgical Site Infection Prevention

## 2011-11-28 NOTE — Consult Note (Signed)
Anesthesia Chart Review: Patient is a 78 year old male scheduled for bilateral inguinal hernia repair with mesh by Dr. [name] on 12/05/11.  He recently moved to [place] from [place].  History includes former smoker, BPH, bladder stone, urinary retention in October 2013 requiring foley catheter, hyperlipidemia, GERD, hypertension, heart murmur (not specified), pleural based right pulmonary nodule by CT abd/pelvis 10/14/11 with one year follow-up recommended.  PCP is Dr. Laren Boom who referred patient to [facility] for management of his hernias.   Preoperative labs noted.  WBC 7.5, H/H 12.6/38.9. Cr 1.08.  EKG on 11/04/11 showed normal sinus rhythm, borderline LAD.  CXR report on 11/25/11 showed: Elevated right hemidiaphragm with bibasilar atelectasis or  scarring. Chronic appearing endplate changes at approximately T9-10 which may be due to chronic infection ("chronic disc degeneration versus chronic diskitis"). The Radiologist recommended correlation with history and symptoms and consider MRI if patient is symptomatic in this area.  (These results were already noted in Epic by Dr. [name].  I also routed results to Dr. Ivan Anchors via his Epic in-basket.  Findings were felt to be chronic, so I will defer additional recommendations to Dr. Ivan Anchors.)  Shonna Chock, PA-C   11/28/11 1541

## 2011-12-04 MED ORDER — CEFAZOLIN SODIUM-DEXTROSE 2-3 GM-% IV SOLR
2.0000 g | INTRAVENOUS | Status: AC
  Administered 2011-12-05: 2 g via INTRAVENOUS

## 2011-12-05 ENCOUNTER — Ambulatory Visit (HOSPITAL_COMMUNITY): Payer: Medicare Other | Admitting: Vascular Surgery

## 2011-12-05 ENCOUNTER — Encounter (HOSPITAL_COMMUNITY): Payer: Self-pay | Admitting: Vascular Surgery

## 2011-12-05 ENCOUNTER — Encounter (HOSPITAL_COMMUNITY)
Admission: RE | Disposition: A | Discharge status: Home / Self Care | Payer: Self-pay | Source: Ambulatory Visit | Attending: Surgery

## 2011-12-05 ENCOUNTER — Ambulatory Visit (HOSPITAL_COMMUNITY)
Admission: RE | Admit: 2011-12-05 | Discharge: 2011-12-06 | Disposition: A | Discharge status: Home / Self Care | Payer: Medicare Other | Source: Ambulatory Visit | Attending: Surgery | Admitting: Surgery

## 2011-12-05 ENCOUNTER — Encounter (HOSPITAL_COMMUNITY): Payer: Self-pay | Admitting: General Practice

## 2011-12-05 DIAGNOSIS — K4020 Bilateral inguinal hernia, without obstruction or gangrene, not specified as recurrent: Secondary | ICD-10-CM | POA: Insufficient documentation

## 2011-12-05 DIAGNOSIS — K219 Gastro-esophageal reflux disease without esophagitis: Secondary | ICD-10-CM | POA: Insufficient documentation

## 2011-12-05 DIAGNOSIS — I10 Essential (primary) hypertension: Secondary | ICD-10-CM | POA: Insufficient documentation

## 2011-12-05 HISTORY — DX: Migraine, unspecified, not intractable, without status migrainosus: G43.909

## 2011-12-05 HISTORY — DX: Pneumonia, unspecified organism: J18.9

## 2011-12-05 HISTORY — DX: Gastric ulcer, unspecified as acute or chronic, without hemorrhage or perforation: K25.9

## 2011-12-05 HISTORY — PX: INGUINAL HERNIA REPAIR: SUR1180

## 2011-12-05 HISTORY — PX: INSERTION OF MESH: SHX5868

## 2011-12-05 HISTORY — PX: INGUINAL HERNIA REPAIR: SHX194

## 2011-12-05 HISTORY — DX: Poisoning by coronary vasodilators, accidental (unintentional), initial encounter: T46.3X1A

## 2011-12-05 SURGERY — REPAIR, HERNIA, INGUINAL, BILATERAL, ADULT
Anesthesia: General | Site: Groin | Laterality: Bilateral | Wound class: Clean

## 2011-12-05 MED ORDER — ATORVASTATIN CALCIUM 10 MG PO TABS
10.0000 mg | ORAL_TABLET | Freq: Every day | ORAL | Status: DC
  Administered 2011-12-05: 10 mg via ORAL
  Filled 2011-12-05 (×3): qty 1

## 2011-12-05 MED ORDER — OXYCODONE-ACETAMINOPHEN 5-325 MG PO TABS
1.0000 | ORAL_TABLET | ORAL | Status: DC | PRN
Start: 2011-12-05 — End: 2011-12-06
  Administered 2011-12-05: 1 via ORAL
  Administered 2011-12-06 (×2): 2 via ORAL
  Filled 2011-12-05: qty 1
  Filled 2011-12-05 (×2): qty 2

## 2011-12-05 MED ORDER — POTASSIUM GLUCONATE 550 MG PO TABS: 1.0000 | ORAL_TABLET | Freq: Every day | ORAL | Status: DC

## 2011-12-05 MED ORDER — BUPIVACAINE-EPINEPHRINE PF 0.25-1:200000 % IJ SOLN
INTRAMUSCULAR | Status: AC
  Filled 2011-12-05: qty 60

## 2011-12-05 MED ORDER — KETOROLAC TROMETHAMINE 15 MG/ML IJ SOLN
INTRAMUSCULAR | Status: DC | PRN
  Administered 2011-12-05: 15 mg via INTRAVENOUS

## 2011-12-05 MED ORDER — BETAXOLOL HCL 0.25 % OP SUSP
1.0000 [drp] | Freq: Two times a day (BID) | OPHTHALMIC | Status: DC
  Administered 2011-12-05 – 2011-12-06 (×2): 1 [drp] via OPHTHALMIC
  Filled 2011-12-05: qty 10

## 2011-12-05 MED ORDER — ACETAMINOPHEN 10 MG/ML IV SOLN: 1000.0000 mg | Freq: Once | INTRAVENOUS | Status: DC | PRN

## 2011-12-05 MED ORDER — GABAPENTIN 300 MG PO CAPS
300.0000 mg | ORAL_CAPSULE | Freq: Three times a day (TID) | ORAL | Status: DC
  Administered 2011-12-05 – 2011-12-06 (×3): 300 mg via ORAL
  Filled 2011-12-05 (×6): qty 1

## 2011-12-05 MED ORDER — MORPHINE SULFATE 2 MG/ML IJ SOLN: 2.0000 mg | INTRAMUSCULAR | Status: DC | PRN

## 2011-12-05 MED ORDER — LIDOCAINE HCL (CARDIAC) 20 MG/ML IV SOLN
INTRAVENOUS | Status: DC | PRN
  Administered 2011-12-05: 40 mg via INTRAVENOUS

## 2011-12-05 MED ORDER — BUPIVACAINE-EPINEPHRINE 0.25% -1:200000 IJ SOLN
INTRAMUSCULAR | Status: DC | PRN
  Administered 2011-12-05: 20 mL

## 2011-12-05 MED ORDER — LACTATED RINGERS IV SOLN
INTRAVENOUS | Status: DC
  Administered 2011-12-06: 50 mL/h via INTRAVENOUS

## 2011-12-05 MED ORDER — CEFAZOLIN SODIUM-DEXTROSE 2-3 GM-% IV SOLR
INTRAVENOUS | Status: AC
  Filled 2011-12-05: qty 50

## 2011-12-05 MED ORDER — KETOROLAC TROMETHAMINE 15 MG/ML IJ SOLN
15.0000 mg | Freq: Four times a day (QID) | INTRAMUSCULAR | Status: AC
  Administered 2011-12-05: 15 mg via INTRAVENOUS
  Filled 2011-12-05: qty 1

## 2011-12-05 MED ORDER — LACTATED RINGERS IV SOLN
INTRAVENOUS | Status: DC | PRN
  Administered 2011-12-05 (×2): via INTRAVENOUS

## 2011-12-05 MED ORDER — LACTATED RINGERS IV SOLN
INTRAVENOUS | Status: DC
  Administered 2011-12-05: 12:00:00 via INTRAVENOUS

## 2011-12-05 MED ORDER — TRAMADOL HCL 50 MG PO TABS: 50.0000 mg | ORAL_TABLET | Freq: Four times a day (QID) | ORAL | Status: DC | PRN

## 2011-12-05 MED ORDER — ONDANSETRON HCL 4 MG/2ML IJ SOLN
INTRAMUSCULAR | Status: DC | PRN
  Administered 2011-12-05: 4 mg via INTRAVENOUS

## 2011-12-05 MED ORDER — DOXAZOSIN MESYLATE 8 MG PO TABS
8.0000 mg | ORAL_TABLET | Freq: Every day | ORAL | Status: DC
  Administered 2011-12-05: 8 mg via ORAL
  Filled 2011-12-05 (×2): qty 1

## 2011-12-05 MED ORDER — KETOROLAC TROMETHAMINE 15 MG/ML IJ SOLN: 15.0000 mg | Freq: Four times a day (QID) | INTRAMUSCULAR | Status: DC | PRN

## 2011-12-05 MED ORDER — 0.9 % SODIUM CHLORIDE (POUR BTL) OPTIME
TOPICAL | Status: DC | PRN
  Administered 2011-12-05: 1000 mL

## 2011-12-05 MED ORDER — ONDANSETRON HCL 4 MG/2ML IJ SOLN: 4.0000 mg | Freq: Once | INTRAMUSCULAR | Status: DC | PRN

## 2011-12-05 MED ORDER — EPHEDRINE SULFATE 50 MG/ML IJ SOLN
INTRAMUSCULAR | Status: DC | PRN
  Administered 2011-12-05 (×2): 5 mg via INTRAVENOUS

## 2011-12-05 MED ORDER — PROPOFOL 10 MG/ML IV BOLUS
INTRAVENOUS | Status: DC | PRN
  Administered 2011-12-05: 180 mg via INTRAVENOUS
  Administered 2011-12-05 (×2): 20 mg via INTRAVENOUS

## 2011-12-05 MED ORDER — FAMOTIDINE 20 MG PO TABS
20.0000 mg | ORAL_TABLET | Freq: Every day | ORAL | Status: DC
  Administered 2011-12-05: 20 mg via ORAL
  Filled 2011-12-05 (×4): qty 1

## 2011-12-05 MED ORDER — HYDROMORPHONE HCL PF 1 MG/ML IJ SOLN
INTRAMUSCULAR | Status: AC
  Filled 2011-12-05: qty 1

## 2011-12-05 MED ORDER — FENTANYL CITRATE 0.05 MG/ML IJ SOLN
INTRAMUSCULAR | Status: DC | PRN
  Administered 2011-12-05: 75 ug via INTRAVENOUS
  Administered 2011-12-05: 50 ug via INTRAVENOUS
  Administered 2011-12-05 (×2): 25 ug via INTRAVENOUS

## 2011-12-05 MED ORDER — HYDROMORPHONE HCL PF 1 MG/ML IJ SOLN
0.2500 mg | INTRAMUSCULAR | Status: DC | PRN
  Administered 2011-12-05: 0.5 mg via INTRAVENOUS

## 2011-12-05 MED ORDER — TAMSULOSIN HCL 0.4 MG PO CAPS
0.4000 mg | ORAL_CAPSULE | Freq: Every day | ORAL | Status: DC
  Administered 2011-12-06: 0.4 mg via ORAL
  Filled 2011-12-05: qty 1

## 2011-12-05 SURGICAL SUPPLY — 47 items
BENZOIN TINCTURE PRP APPL 2/3 (GAUZE/BANDAGES/DRESSINGS) ×2 IMPLANT
BLADE SURG 15 STRL LF DISP TIS (BLADE) ×1 IMPLANT
BLADE SURG 15 STRL SS (BLADE) ×1
BLADE SURG ROTATE 9660 (MISCELLANEOUS) IMPLANT
CHLORAPREP W/TINT 26ML (MISCELLANEOUS) ×2 IMPLANT
CLOTH BEACON ORANGE TIMEOUT ST (SAFETY) ×2 IMPLANT
COVER SURGICAL LIGHT HANDLE (MISCELLANEOUS) ×2 IMPLANT
DECANTER SPIKE VIAL GLASS SM (MISCELLANEOUS) ×2 IMPLANT
DRAIN PENROSE 1/2X12 LTX STRL (WOUND CARE) ×2 IMPLANT
DRAPE LAPAROSCOPIC ABDOMINAL (DRAPES) IMPLANT
DRAPE LAPAROTOMY TRNSV 102X78 (DRAPE) ×2 IMPLANT
DRAPE UTILITY 15X26 W/TAPE STR (DRAPE) ×4 IMPLANT
DRSG TEGADERM 4X4.75 (GAUZE/BANDAGES/DRESSINGS) ×4 IMPLANT
ELECT CAUTERY BLADE 6.4 (BLADE) ×2 IMPLANT
ELECT REM PT RETURN 9FT ADLT (ELECTROSURGICAL) ×2
ELECTRODE REM PT RTRN 9FT ADLT (ELECTROSURGICAL) ×1 IMPLANT
GAUZE SPONGE 4X4 16PLY XRAY LF (GAUZE/BANDAGES/DRESSINGS) ×2 IMPLANT
GLOVE BIO SURGEON STRL SZ7 (GLOVE) ×4 IMPLANT
GLOVE BIOGEL PI IND STRL 7.5 (GLOVE) ×2 IMPLANT
GLOVE BIOGEL PI INDICATOR 7.5 (GLOVE) ×2
GOWN STRL NON-REIN LRG LVL3 (GOWN DISPOSABLE) ×6 IMPLANT
KIT BASIN OR (CUSTOM PROCEDURE TRAY) ×2 IMPLANT
KIT ROOM TURNOVER OR (KITS) ×2 IMPLANT
MESH ULTRAPRO 6X6 15CM15CM (Mesh General) ×2 IMPLANT
NEEDLE HYPO 25GX1X1/2 BEV (NEEDLE) ×2 IMPLANT
NS IRRIG 1000ML POUR BTL (IV SOLUTION) ×2 IMPLANT
PACK SURGICAL SETUP 50X90 (CUSTOM PROCEDURE TRAY) ×2 IMPLANT
PAD ARMBOARD 7.5X6 YLW CONV (MISCELLANEOUS) ×2 IMPLANT
PENCIL BUTTON HOLSTER BLD 10FT (ELECTRODE) ×2 IMPLANT
SPECIMEN JAR SMALL (MISCELLANEOUS) IMPLANT
SPONGE GAUZE 4X4 12PLY (GAUZE/BANDAGES/DRESSINGS) ×2 IMPLANT
SPONGE INTESTINAL PEANUT (DISPOSABLE) IMPLANT
STRIP CLOSURE SKIN 1/2X4 (GAUZE/BANDAGES/DRESSINGS) ×2 IMPLANT
SUT MNCRL AB 4-0 PS2 18 (SUTURE) ×4 IMPLANT
SUT PDS AB 0 CT 36 (SUTURE) ×8 IMPLANT
SUT PROLENE 2 0 SH DA (SUTURE) ×8 IMPLANT
SUT SILK 2 0 SH (SUTURE) IMPLANT
SUT SILK 3 0 (SUTURE) ×1
SUT SILK 3-0 18XBRD TIE 12 (SUTURE) ×1 IMPLANT
SUT VIC AB 0 CT2 27 (SUTURE) IMPLANT
SUT VIC AB 2-0 SH 27 (SUTURE) ×2
SUT VIC AB 2-0 SH 27XBRD (SUTURE) ×2 IMPLANT
SUT VIC AB 3-0 SH 27 (SUTURE) ×2
SUT VIC AB 3-0 SH 27X BRD (SUTURE) ×2 IMPLANT
SYR CONTROL 10ML LL (SYRINGE) ×2 IMPLANT
TOWEL OR 17X24 6PK STRL BLUE (TOWEL DISPOSABLE) ×2 IMPLANT
TOWEL OR 17X26 10 PK STRL BLUE (TOWEL DISPOSABLE) ×2 IMPLANT

## 2011-12-05 NOTE — H&P (Signed)
Progress Notes   Roberto Day (MR# [id number])       Progress Notes Info       Author Note Status Last Update User Last Update Date/Time    Roberto Day. [name], MD Signed Roberto Day. [name], MD 11/02/2011  6:00 PM      Progress Notes     Patient ID: Roberto Day., male   DOB: 10/06/1921, 78 y.o.   MRN: [id number]    Chief Complaint   Patient presents with   .  Hernia        HPI Roberto [name] is a 78 y.o. male.  Referred by Dr. Ivan Anchors for evaluation of bilateral inguinal hernias HPI This is an 78 year old male who recently moved to [place] from [place]. Prior to his move, he was diagnosed with bilateral inguinal hernias. In retrospect he has had a left inguinal hernia for at least 3 years. The left side has become larger and mildly uncomfortable. He had an emergency department visit in August with a right inguinal hernia that sounds like it was incarcerated. It was successfully reduced. He has established new care here in [place] with Dr. Ivan Anchors who refers him for evaluation for hernia repair. Previously the patient was quite active and enjoys playing golf. He has had a lot of problems with his left knee as well as his hernias which is keeping him from playing golf. He is fairly independent. He denies any obstructive symptoms. He has had some problems with urinary retention but this seems to be improved.    Past Medical History   Diagnosis  Date   .  Unintentional weight loss  10/07/2011   .  BPH (benign prostatic hyperplasia)  10/07/2011   .  Hematuria  10/07/2011   .  Bladder stone  10/07/2011        PSH:  Circumcision Ear surgery      Family History   Problem  Relation  Age of Onset   .  Heart attack  Mother     .  Stroke  Mother          Social History History   Substance Use Topics   .  Smoking status:  Former Games developer   .  Smokeless tobacco:  Not on file   .  Alcohol Use:  Yes         rarly beer         Allergies   Allergen   Reactions   .  Codeine  Nausea Only         Current Outpatient Prescriptions   Medication  Sig  Dispense  Refill   .  betaxolol (BETOPTIC-S) 0.25 % ophthalmic suspension  Place 1 drop into both eyes 2 (two) times daily.         .  celecoxib (CELEBREX) 200 MG capsule  Take 200 mg by mouth daily as needed.         .  ciprofloxacin (CIPRO) 500 MG tablet  Take 500 mg by mouth 2 (two) times daily.         Marland Kitchen  gabapentin (NEURONTIN) 300 MG capsule  Take 300 mg by mouth 3 (three) times daily.         Marland Kitchen  HYDROcodone-acetaminophen (VICODIN) 5-500 MG per tablet  Take 1 tablet by mouth every 6 (six) hours as needed.         .  Multiple Vitamins-Minerals (CENTRUM SILVER PO)  Take by mouth.         Marland Kitchen  Naproxen Sodium (ALEVE PO)  Take by mouth.         .  Potassium Gluconate 550 MG TABS  Take 1 tablet by mouth daily.         Marland Kitchen  RANITIDINE HCL PO  Take by mouth.         .  Saw Palmetto, Serenoa repens, (SAW PALMETTO PO)  Take by mouth.         .  Tamsulosin HCl (FLOMAX) 0.4 MG CAPS  Take 0.4 mg by mouth daily.         .  traMADol (ULTRAM) 50 MG tablet  Take 50 mg by mouth every 6 (six) hours as needed.              Review of Systems Review of Systems  Constitutional: Positive for unexpected weight change. Negative for fever and chills.  HENT: Positive for hearing loss. Negative for congestion, sore throat, trouble swallowing and voice change.   Eyes: Negative for visual disturbance.  Respiratory: Negative for cough and wheezing.   Cardiovascular: Positive for leg swelling. Negative for chest pain and palpitations.  Gastrointestinal: Negative for nausea, vomiting, abdominal pain, diarrhea, constipation, blood in stool, abdominal distention, anal bleeding and rectal pain.  Genitourinary: Negative for hematuria and difficulty urinating.  Musculoskeletal: Positive for joint swelling and arthralgias.  Skin: Negative for rash and wound.  Neurological: Negative for seizures, syncope, weakness and  headaches.  Hematological: Negative for adenopathy. Does not bruise/bleed easily.  Psychiatric/Behavioral: Negative for confusion.      Blood pressure 123/70, pulse 88, temperature 97.6 F (36.4 C), temperature source Temporal, resp. rate 18, height 5\' 7"  (1.702 m), weight 167 lb 6.4 oz (75.932 kg).   Physical Exam Physical Exam Elderly male in NAD HEENT:  EOMI, sclera anicteric Neck:  No masses, no thyromegaly Lungs:  CTA bilaterally; normal respiratory effort CV:  Regular rate and rhythm; heart murmur Abd:  +bowel sounds, soft, non-tender, no masses GU: bilateral descended testes; no testicular masses; large left inguinal hernia - reducible; moderate-sized right inguinal hernia - reducible Ext:  Well-perfused; no edema Skin:  Warm, dry; no sign of jaundice   Data Reviewed none   Assessment    Bilateral inguinal hernias     Plan    Bilateral inguinal hernia repairs with mesh.  The surgical procedure has been discussed with the patient.  Potential risks, benefits, alternative treatments, and expected outcomes have been explained.  All of the patient's questions at this time have been answered.  The likelihood of reaching the patient's treatment goal is good.  The patient understand the proposed surgical procedure and wishes to proceed. We will plan on an overnight stay due to his age.       Roberto Day. [name], MD, [facility]  12/05/2011 12:29 PM

## 2011-12-05 NOTE — Anesthesia Postprocedure Evaluation (Signed)
  Anesthesia Post-op Note  Patient: Roberto Schlatter Sr.  Procedure(s) Performed: Procedure(s) (LRB) with comments: HERNIA REPAIR INGUINAL ADULT BILATERAL (Bilateral) INSERTION OF MESH (Bilateral)  Patient Location: PACU  Anesthesia Type:General  Level of Consciousness: awake, alert , oriented and patient cooperative  Airway and Oxygen Therapy: Patient Spontanous Breathing  Post-op Pain: mild  Post-op Assessment: Post-op Vital signs reviewed, Patient's Cardiovascular Status Stable, Respiratory Function Stable, Patent Airway, No signs of Nausea or vomiting and Pain level controlled  Post-op Vital Signs: Reviewed and stable  Complications: No apparent anesthesia complications

## 2011-12-05 NOTE — Anesthesia Preprocedure Evaluation (Addendum)
Anesthesia Evaluation  Patient identified by MRN, date of birth, ID band Patient awake    Reviewed: Allergy & Precautions, H&P , NPO status , Patient's Chart, lab work & pertinent test results  History of Anesthesia Complications (+) DIFFICULT AIRWAYNegative for: history of anesthetic complications  Airway Mallampati: II TM Distance: >3 FB Neck ROM: Full    Dental  (+) Teeth Intact and Dental Advisory Given   Pulmonary  breath sounds clear to auscultation        Cardiovascular hypertension, Pt. on medications Rhythm:Regular Rate:Normal     Neuro/Psych    GI/Hepatic GERD-  Controlled and Medicated,  Endo/Other    Renal/GU      Musculoskeletal  (+) Arthritis -, Rheumatoid disorders,    Abdominal   Peds  Hematology   Anesthesia Other Findings   Reproductive/Obstetrics                         Anesthesia Physical Anesthesia Plan  ASA: III  Anesthesia Plan: General   Post-op Pain Management:    Induction: Intravenous  Airway Management Planned: LMA  Additional Equipment:   Intra-op Plan:   Post-operative Plan:   Informed Consent: I have reviewed the patients History and Physical, chart, labs and discussed the procedure including the risks, benefits and alternatives for the proposed anesthesia with the patient or authorized representative who has indicated his/her understanding and acceptance.   Dental advisory given  Plan Discussed with: CRNA and Surgeon  Anesthesia Plan Comments: (Bilat inguinal hernia Htn GERD Peripheral neuropathy on neurontin   Plan GA with LMA)        Anesthesia Quick Evaluation

## 2011-12-05 NOTE — Op Note (Signed)
Hernia, Open, Procedure Note  Indications: The patient presented with a history of a bilateral, reducible inguinal hernia.    Pre-operative Diagnosis: bilateral reducible inguinal hernia Post-operative Diagnosis: same  Surgeon: Wynona Luna.   Assistants: noen  Anesthesia: General LMA anesthesia  ASA Class: 2  Procedure Details  The patient was seen again in the Holding Room. The risks, benefits, complications, treatment options, and expected outcomes were discussed with the patient. The possibilities of reaction to medication, pulmonary aspiration, perforation of viscus, bleeding, recurrent infection, the need for additional procedures, and development of a complication requiring transfusion or further operation were discussed with the patient and/or family. The likelihood of success in repairing the hernias and returning the patient to their previous functional status is good.  There was concurrence with the proposed plan, and informed consent was obtained. The site of surgery was properly noted/marked. The patient was taken to the Operating Room, identified as Roberto DUBIE Sr., and the procedure verified as bilateral inguinal hernia repair. A Time Out was held and the above information confirmed.  The patient was placed in the supine position and underwent induction of anesthesia. The lower abdomen and groin was prepped with Chloraprep and draped in the standard fashion.  We began on the right side.  0.25% Marcaine with epinephrine was used to anesthetize the skin over the mid-portion of the inguinal canal. An oblique incision was made. Dissection was carried down through the subcutaneous tissue with cautery to the external oblique fascia.  We opened the external oblique fascia along the direction of its fibers to the external ring.  The spermatic cord was circumferentially dissected bluntly and retracted with a Penrose drain.  The floor of the inguinal canal was inspected and was lax.  We  skeletonized the spermatic cord and reduced a large indirect hernia sac.  The floor of the inguinal canal was reinforced with 0 Vicryl.  We used a 6 x 6 inch piece of Ultrapro mesh, which was cut into a keyhole shape.  This was secured with 2-0 Prolene, beginning at the pubic tubercle, running this along the internal oblique fascia superiorly and the shelving edge inferiorly.  The tails of the mesh were sutured together behind the spermatic cord.  The mesh was tucked underneath the external oblique fascia laterally.  The external oblique fascia was reapproximated with 2-0 Vicryl.  3-0 Vicryl was used to close the subcutaneous tissues and 4-0 Monocryl was used to close the skin in subcuticular fashion.   We turned our attention to the left side.  0.25% Marcaine with epinephrine was used to anesthetize the skin over the mid-portion of the inguinal canal. An oblique incision was made. Dissection was carried down through the subcutaneous tissue with cautery to the external oblique fascia.  We opened the external oblique fascia along the direction of its fibers to the external ring.  The spermatic cord was circumferentially dissected bluntly and retracted with a Penrose drain.  The floor of the inguinal canal was inspected and showed a very large direct hernia.  The hernia sac was reduced and we closed the floor of the canal with 0 Vicryl.  We skeletonized the spermatic cord and reduced a large indirect hernia sac.  We used the remainder of the 6 x 6 inch piece of Ultrapro mesh, which was cut into a keyhole shape.  This was secured with 2-0 Prolene, beginning at the pubic tubercle, running this along the internal oblique fascia superiorly and the shelving edge inferiorly.  The tails  of the mesh were sutured together behind the spermatic cord.  The mesh was tucked underneath the external oblique fascia laterally.  The external oblique fascia was reapproximated with 2-0 Vicryl.  3-0 Vicryl was used to close the  subcutaneous tissues and 4-0 Monocryl was used to close the skin in subcuticular fashion.     Benzoin and steri-strips were used to seal the incision.  A clean dressing was applied.  The patient was then extubated and brought to the recovery room in stable condition.  All sponge, instrument, and needle counts were correct prior to closure and at the conclusion of the case.   Estimated Blood Loss: Minimal                 Complications: None; patient tolerated the procedure well.         Disposition: PACU - hemodynamically stable.         Condition: stable  [name]. [name], MD, [facility]  12/05/2011 3:48 PM

## 2011-12-05 NOTE — Transfer of Care (Signed)
Immediate Anesthesia Transfer of Care Note  Patient: Roberto Schlatter Sr.  Procedure(s) Performed: Procedure(s) (LRB) with comments: HERNIA REPAIR INGUINAL ADULT BILATERAL (Bilateral) INSERTION OF MESH (Bilateral)  Patient Location: PACU  Anesthesia Type:General  Level of Consciousness: awake, alert  and oriented  Airway & Oxygen Therapy: Patient Spontanous Breathing and Patient connected to face mask oxygen  Post-op Assessment: Report given to PACU RN and Post -op Vital signs reviewed and stable  Post vital signs: Reviewed and stable  Complications: No apparent anesthesia complications

## 2011-12-05 NOTE — Preoperative (Signed)
Beta Blockers   Reason not to administer Beta Blockers:Not Applicable 

## 2011-12-05 NOTE — Anesthesia Procedure Notes (Signed)
Procedure Name: LMA Insertion Date/Time: 12/05/2011 1:50 PM Performed by: Romie Minus K Pre-anesthesia Checklist: Patient identified, Emergency Drugs available, Suction available, Patient being monitored and Timeout performed Patient Re-evaluated:Patient Re-evaluated prior to inductionOxygen Delivery Method: Circle system utilized Preoxygenation: Pre-oxygenation with 100% oxygen Intubation Type: IV induction Ventilation: Mask ventilation without difficulty LMA: LMA inserted LMA Size: 5.0 Number of attempts: 1 Placement Confirmation: positive ETCO2 and breath sounds checked- equal and bilateral Tube secured with: Tape Dental Injury: Teeth and Oropharynx as per pre-operative assessment  Comments: Inserted by R.edathil SRNA

## 2011-12-06 ENCOUNTER — Encounter (HOSPITAL_COMMUNITY): Payer: Self-pay | Admitting: Surgery

## 2011-12-06 MED ORDER — OXYCODONE-ACETAMINOPHEN 5-325 MG PO TABS: 1.0000 | ORAL_TABLET | ORAL | Status: DC | PRN

## 2011-12-06 NOTE — Progress Notes (Signed)
UR completed 

## 2011-12-06 NOTE — Discharge Summary (Signed)
Physician Discharge Summary  Patient ID: Roberto MCKILLOP Sr. MRN: [id number] DOB/AGE: 06-18-1921 78 y.o.  Admit date: 12/05/2011 Discharge date: 12/06/2011  Admission Diagnoses: Bilateral inguinal hernias  Discharge Diagnoses: Bilateral inguinal hernias Active Problems:  * No active hospital problems. *    Discharged Condition: good  Hospital Course: Open repair of bilateral inguinal hernias with mesh 12/05/11.  Kept overnight for observation  Consults: None  Significant Diagnostic Studies: none  Treatments: surgery: Bilateral inguinal hernia repairs with mesh  Discharge Exam: Blood pressure 100/44, pulse 75, temperature 98 F (36.7 C), temperature source Oral, resp. rate 18, height 5\' 7"  (1.702 m), weight 159 lb 4.8 oz (72.258 kg), SpO2 96.00%. Minimal swelling; no hematoma; more tenderness on right than left  Disposition: Discharge home  Discharge Orders    Future Appointments: Provider: Department: Dept Phone: Center:   12/22/2011 1:30 PM [name]. [name], MD [facility], [place] [phone] None   02/06/2012 10:00 AM Laren Boom, DO [place] [facility] [place] [phone] None     Future Orders Please Complete By Expires   Diet general      Increase activity slowly      May walk up steps      May shower / Bathe      Driving Restrictions      Comments:   Do not drive while taking pain medications   Call MD for:  temperature >100.4      Call MD for:  persistant nausea and vomiting      Call MD for:  severe uncontrolled pain      Call MD for:  redness, tenderness, or signs of infection (pain, swelling, redness, odor or green/yellow discharge around incision site)      Discharge instructions      Comments:   [facility]   INGUINAL HERNIA REPAIR: POST OP INSTRUCTIONS  Always review your discharge instruction sheet given to you by the facility where your surgery was performed. IF YOU HAVE DISABILITY OR FAMILY LEAVE FORMS,  YOU MUST BRING THEM TO THE OFFICE FOR PROCESSING.   DO NOT GIVE THEM TO YOUR DOCTOR.  A  prescription for pain medication may be given to you upon discharge.  Take your pain medication as prescribed, if needed.  If narcotic pain medicine is not needed, then you may take acetaminophen (Tylenol) or ibuprofen (Advil) as needed. Take your usually prescribed medications unless otherwise directed. If you need a refill on your pain medication, please contact your pharmacy.  They will contact our office to request authorization. Prescriptions will not be filled after 5 pm or on week-ends. You should follow a light diet the first 24 hours after arrival home, such as soup and crackers, etc.  Be sure to include lots of fluids daily.  Resume your normal diet the day after surgery. Most patients will experience some swelling and bruising around the umbilicus or in the groin and scrotum.  Ice packs and reclining will help.  Swelling and bruising can take several days to resolve.  It is common to experience some constipation if taking pain medication after surgery.  Increasing fluid intake and taking a stool softener (such as Colace) will usually help or prevent this problem from occurring.  A mild laxative (Milk of Magnesia or Miralax) should be taken according to package directions if there are no bowel movements after 48 hours. Unless discharge instructions indicate otherwise, you may remove your bandages 24-48 hours after surgery, and you may shower  at that time.  You will have steri-strips (small skin tapes) in place directly over the incision.  These strips should be left on the skin for 7-10 days. ACTIVITIES:  You may resume regular (light) daily activities beginning the next day-such as daily self-care, walking, climbing stairs-gradually increasing activities as tolerated.  You may have sexual intercourse when it is comfortable.  Refrain from any heavy lifting or straining until approved by your doctor. You may  drive when you are no longer taking prescription pain medication, you can comfortably wear a seatbelt, and you can safely maneuver your car and apply brakes. RETURN TO WORK:  2-3 weeks with light duty - no lifting over 15 lbs. You should see your doctor in the office for a follow-up appointment approximately 2-3 weeks after your surgery.  Make sure that you call for this appointment within a day or two after you arrive home to insure a convenient appointment time. OTHER INSTRUCTIONS:  __________________________________________________________________________________________________________________________________________________________________________________________  WHEN TO CALL YOUR DOCTOR: Fever over 101.0 Inability to urinate Nausea and/or vomiting Extreme swelling or bruising Continued bleeding from incision. Increased pain, redness, or drainage from the incision  The clinic staff is available to answer your questions during regular business hours.  Please don't hesitate to call and ask to speak to one of the nurses for clinical concerns.  If you have a medical emergency, go to the nearest emergency room or call 911.  A surgeon from [facility] is always on call at the hospital   [address], [place], [place]  [address] ?  [address] [address], [place], [place]   [address] [phone]    FAX [phone] Web site: [email or web address]       Medication List     As of 12/06/2011  8:30 AM    STOP taking these medications         HYDROcodone-acetaminophen 5-500 MG per tablet   Commonly known as: VICODIN      TAKE these medications         ALEVE PO   Take 220 mg by mouth 2 (two) times daily as needed. For pain      atorvastatin 10 MG tablet   Commonly known as: LIPITOR   Take 1 tablet (10 mg total) by mouth daily.      betaxolol 0.25 % ophthalmic suspension   Commonly known as: BETOPTIC-S   Place 1 drop into both eyes 2  (two) times daily.      celecoxib 200 MG capsule   Commonly known as: CELEBREX   Take 200 mg by mouth daily as needed. For pain      CENTRUM SILVER PO   Take 1 tablet by mouth daily.      doxazosin 8 MG tablet   Commonly known as: CARDURA   Take 8 mg by mouth at bedtime.      gabapentin 300 MG capsule   Commonly known as: NEURONTIN   Take 1 capsule (300 mg total) by mouth 3 (three) times daily.      oxyCODONE-acetaminophen 5-325 MG per tablet   Commonly known as: PERCOCET/ROXICET   Take 1-2 tablets by mouth every 4 (four) hours as needed.      Potassium Gluconate 550 MG Tabs   Take 1 tablet (550 mg total) by mouth daily.      RANITIDINE HCL PO   Take 1 tablet by mouth daily.      SAW PALMETTO PO   Take  1 tablet by mouth daily.      Tamsulosin HCl 0.4 MG Caps   Commonly known as: FLOMAX   Take 1 capsule (0.4 mg total) by mouth daily.      traMADol 50 MG tablet   Commonly known as: ULTRAM   Take 50 mg by mouth every 6 (six) hours as needed. For pain           Follow-up Information    Follow up with Wynona Luna., MD. Schedule an appointment as soon as possible for a visit in 2 weeks.   Contact information:   [address] [place] [address] [phone]          Signed: Wynona Luna. 12/06/2011, 8:30 AM

## 2011-12-06 NOTE — Progress Notes (Signed)
Discharge patient to home ° °

## 2011-12-07 ENCOUNTER — Telehealth (INDEPENDENT_AMBULATORY_CARE_PROVIDER_SITE_OTHER): Payer: Self-pay | Admitting: General Surgery

## 2011-12-07 NOTE — Telephone Encounter (Signed)
Daughter-in-law called to review post-op orders for dressing change. They did have instructions to use ice packs.The patient also asked if he could apply his Lidoderm patch (used for knee) to his incision. I said he could not use the Lidoderm patch on his incision. Also he could remove outer dressing over hernia repair site after 48 hours from surgery.Lanier Prude

## 2011-12-09 ENCOUNTER — Encounter: Payer: Self-pay | Admitting: Family Medicine

## 2011-12-09 DIAGNOSIS — M5134 Other intervertebral disc degeneration, thoracic region: Secondary | ICD-10-CM | POA: Insufficient documentation

## 2011-12-22 ENCOUNTER — Ambulatory Visit (INDEPENDENT_AMBULATORY_CARE_PROVIDER_SITE_OTHER): Payer: Medicare Other | Admitting: Surgery

## 2011-12-22 ENCOUNTER — Encounter (INDEPENDENT_AMBULATORY_CARE_PROVIDER_SITE_OTHER): Payer: Self-pay | Admitting: Surgery

## 2011-12-22 VITALS — BP 124/77 | HR 74 | Temp 97.2°F | Resp 16

## 2011-12-22 DIAGNOSIS — K4020 Bilateral inguinal hernia, without obstruction or gangrene, not specified as recurrent: Secondary | ICD-10-CM

## 2011-12-22 NOTE — Progress Notes (Signed)
Status post open repairs of bilateral inguinal hernias on 12/05/11. The patient is doing quite well. He is in a wheelchair due to some pain in his knees. He has minimal swelling in each groin. No sign of infection and either incision. No sign of recurrent hernia. He may resume full activity. I encouraged him to avoid any constipation or use and when necessary laxatives and stool softeners. He may followup as needed.  Roberto Day. [name], MD, [facility]  12/22/2011 1:40 PM

## 2011-12-30 ENCOUNTER — Telehealth: Payer: Self-pay | Admitting: Family Medicine

## 2011-12-30 MED ORDER — DOXAZOSIN MESYLATE 8 MG PO TABS: 8.0000 mg | ORAL_TABLET | Freq: Every day | ORAL | Status: DC

## 2011-12-30 NOTE — Telephone Encounter (Signed)
Per patient request

## 2012-02-06 ENCOUNTER — Encounter: Payer: Self-pay | Admitting: Family Medicine

## 2012-02-06 ENCOUNTER — Ambulatory Visit (INDEPENDENT_AMBULATORY_CARE_PROVIDER_SITE_OTHER): Payer: Medicare Other | Admitting: Family Medicine

## 2012-02-06 VITALS — BP 127/77 | HR 78 | Wt 158.0 lb

## 2012-02-06 DIAGNOSIS — R197 Diarrhea, unspecified: Secondary | ICD-10-CM

## 2012-02-06 DIAGNOSIS — M25569 Pain in unspecified knee: Secondary | ICD-10-CM

## 2012-02-06 DIAGNOSIS — M25562 Pain in left knee: Secondary | ICD-10-CM

## 2012-02-06 DIAGNOSIS — N400 Enlarged prostate without lower urinary tract symptoms: Secondary | ICD-10-CM

## 2012-02-06 NOTE — Progress Notes (Signed)
CC: Roberto TSCHANTZ Sr. is a 79 y.o. male is here for medicaton f/u   Subjective: HPI:  Patient presents with complaints of left knee pain: Pain has been present for decades. It is described as a stiffness and soreness that is mild to moderate in severity. It is worse with periods of inactivity and improves the more he uses it. Worse first thing in the morning but improves greatly during the morning. He has had cortisone shots in the past which completely eradicate pain for months to years. He denies any swelling, laxity, giving way, weakness, redness of the left knee. He denies any recent trauma. He denies any trouble with inability nor any falls. Interventions so far have included hydrocodone 5/325 which helps slightly.  Patient and plans of loose stools that occur once a month on almost a monthly basis. They are associated with increased urgency to defecate without pain prior during or following defecation. Stools are described as loose only. He denies melena nor blood in his stool. No interventions are given in his bowel regularity improves within 24-48 hours. Nothing in particular makes this better or worse. Daughter mentions that he seems somewhat fatigued for the days following a one-day loose stool episode. Patient cannot quantify how often he is defecating during these episodes. Had an episode last week but is back to well formed daily bowel movements.  BPH: Patient continues on Cardura and Flomax. Urinating once at night. Denies sensation of incomplete voiding, strain to urinate, urinary incontinence, nor weak stream.  Review Of Systems Outlined In HPI  Past Medical History  Diagnosis Date  . Unintentional weight loss 10/07/2011  . BPH (benign prostatic hyperplasia) 10/07/2011  . Hematuria 10/07/2011  . Bladder stone 10/07/2011  . Heart murmur   . Hyperlipidemia   . Bladder infection     hx of  . GERD (gastroesophageal reflux disease)     only take medication as needed  . Arthritis     "bilateral knees and right shoulder"  . Hypertension     Dr. Laren Boom, [facility]  . Pneumonia     "had it very frequently when I was a child" (12/05/2011)  . Nitrate poisoning 1945  . Stomach ulcer 1945    "caused by nitrate poisoning" (12/05/2011)  . Migraines     "went away inthe early 1940's" (12/05/2011)     Family History  Problem Relation Age of Onset  . Heart attack Mother   . Stroke Mother      History  Substance Use Topics  . Smoking status: Former Smoker -- 5 years    Types: Cigarettes  . Smokeless tobacco: Never Used     Comment: 12/05/2011 "haven't smoked since age [age]"  . Alcohol Use: Yes     Comment: 12/05/2011 "very rarely have a beer"     Objective: Filed Vitals:   02/06/12 1010  BP: 127/77  Pulse: 78    General: Alert and Oriented, No Acute Distress HEENT: Pupils equal, round, reactive to light. Conjunctivae clear.  Pharynx unremarkable, moist mucous membranes Lungs: Clear to auscultation bilaterally, no wheezing/ronchi/rales.  Comfortable work of breathing. Good air movement. Cardiac: Regular rate and rhythm. Normal S1/S2.  No murmurs, rubs, nor gallops.   Abdomen: Normal bowel sounds, soft and non tender without palpable masses. Extremities: No peripheral edema.  Strong peripheral pulses. Left knee without valgus or varus laxity or discomfort. Anterior drawer negative, Mc Murray's negative, full-strength and motion of the left knee. No swelling redness or  warmth of the knee. No pain reproduction with palpation of the patella or joint line. Mental Status: No depression, anxiety, nor agitation. Skin: Warm and dry.  Assessment & Plan: Roberto Day was seen today for medicaton f/u.  Diagnoses and associated orders for this visit:  Bph (benign prostatic hyperplasia)  Diarrhea - Stool, WBC/Lactoferrin - POC Hemoccult Bld/Stl (3-Cd Home Screen); Future  Left knee pain  Other Orders - HYDROcodone-acetaminophen (NORCO/VICODIN) 5-325 MG per  tablet; Take 1 tablet by mouth every 6 (six) hours as needed.    BPH: Stable, continue Flomax and Cardura Diarrhea: Rule out inflammatory causes, low suspicion for active infectious etiology. Left knee pain: Clinically suspect osteoarthritis, patient specifically requesting consideration of steroid injection.  Return in about 3 months (around 05/05/2012), or if symptoms worsen or fail to improve.  Knee Injection Procedure Note  Pre-operative Diagnosis: Left knee osteoarthritis  Post-operative Diagnosis: Same  Indications: Pain Anesthesia: Aerosol cold spray  Procedure Details   Verbal consent was obtained for the procedure. The joint was prepped with alcohol and a aerosol cold spray was applied to the site of injection. A 22 gauge needle was inserted medial inferior aspect of the joint 3 ml 1% lidocaine and 1 ml of triamcinolone (KENALOG) 40mg /ml was then injected into the joint through the same needle. The needle was removed and the area cleansed and dressed.  Complications:  None; patient tolerated the procedure well.

## 2012-02-10 ENCOUNTER — Telehealth: Payer: Self-pay | Admitting: Family Medicine

## 2012-02-10 DIAGNOSIS — R197 Diarrhea, unspecified: Secondary | ICD-10-CM

## 2012-02-10 NOTE — Telephone Encounter (Signed)
Sue Lush, Will you please let the Satter family know that his stool sample showed inflammation of the large colon that would explain his episodes of diarrhea.  It would be wise for him to discuss this with a gastroenterologist therefore I've put in a referral for a visit with a specialist.  No need to change medications at this time.

## 2012-02-10 NOTE — Telephone Encounter (Signed)
Does the family know whether it's the tamsulosin/flomax or the doxazosin/cardura?  If it's the doxazosin/cardura and they can cut the tablet in half it would be reasonable to only take half a dose daily.

## 2012-02-10 NOTE — Telephone Encounter (Signed)
The daughter was thinks both of the medications make him sleepy, when he takes the one medication in the am he has to go lay down and then feels better  and when he takes the other towards the evening he feels tired

## 2012-02-10 NOTE — Telephone Encounter (Signed)
Pt's daughter notified and voiced understanding; she wanted you to be aware that his prostate medication is making him very drowsy. He is taking them as rx'ed. Do you have any  recommendations for this problem?

## 2012-02-13 NOTE — Telephone Encounter (Signed)
LMOM for pt to return call. KG LPN 

## 2012-02-13 NOTE — Telephone Encounter (Signed)
Would you mind seeing if he can take both flomax/tamsulosin and the doxazosin/carduria right when he goes to bed.  He could even cut the doxazosin/carduria in half if the evening drowsiness is still an issue.

## 2012-02-14 NOTE — Telephone Encounter (Signed)
I notified daughter of Dr. Genelle Bal reccomedations last evening. Daughter voiced understanding and will call if this doesn't work

## 2012-02-21 ENCOUNTER — Ambulatory Visit (INDEPENDENT_AMBULATORY_CARE_PROVIDER_SITE_OTHER): Payer: Medicare Other | Admitting: Family Medicine

## 2012-02-21 ENCOUNTER — Encounter: Payer: Self-pay | Admitting: Family Medicine

## 2012-02-21 VITALS — BP 102/62 | HR 79 | Wt 156.0 lb

## 2012-02-21 DIAGNOSIS — I10 Essential (primary) hypertension: Secondary | ICD-10-CM

## 2012-02-21 DIAGNOSIS — R197 Diarrhea, unspecified: Secondary | ICD-10-CM

## 2012-02-21 DIAGNOSIS — Z79899 Other long term (current) drug therapy: Secondary | ICD-10-CM

## 2012-02-21 NOTE — Progress Notes (Signed)
CC: Roberto Day. is a 79 y.o. male is here for discuss meds   Subjective: HPI:  Patient presents with daughter-in-law. He would like to discuss if we can cut back on any his medications.  He believes his medications are causing diarrhea. He has had diarrhea one to 2 times a month that is followed by 2-3 days of fatigue the point where he stays in bed. Over the last 2 months it is happening almost weekly.  He has not been able to correlate any specific medication as causing his diarrhea. There been no changes to his medication regimen recently. Fecal lactoferrin was positive recently, they have an appointment with GI in March but looking for something sooner and closer to [facility]. He is unsure as to what makes his diarrhea better or worse, it often resolves without any specific intervention  He complains that his blood pressure at values of systolics 180 multiple times his last 2 weeks. On direct questioning it sounds like this occurs during and the day after diarrhea spells. Cardura is the only antihypertensive medication he takes he denies increased salt intake. He does admit to taking Aleve up to 3 times a day even when having diarrhea.  He has a history of BPH that he was told would require surgery. He takes saw palmetto, Flomax, and Cardura. He denies trouble urinating, incomplete voiding, straining to urinate, nor accidents.  As with many of his visits he is into great detail of what physicians have done for him in the past and his understanding of what each medication he is taking is specifically for.   Review Of Systems Outlined In HPI  Past Medical History  Diagnosis Date  . Unintentional weight loss 10/07/2011  . BPH (benign prostatic hyperplasia) 10/07/2011  . Hematuria 10/07/2011  . Bladder stone 10/07/2011  . Heart murmur   . Hyperlipidemia   . Bladder infection     hx of  . GERD (gastroesophageal reflux disease)     only take medication as needed  . Arthritis    "bilateral knees and right shoulder"  . Hypertension     Dr. Laren Boom, [facility]  . Pneumonia     "had it very frequently when I was a child" (12/05/2011)  . Nitrate poisoning 1945  . Stomach ulcer 1945    "caused by nitrate poisoning" (12/05/2011)  . Migraines     "went away inthe early 1940's" (12/05/2011)     Family History  Problem Relation Age of Onset  . Heart attack Mother   . Stroke Mother      History  Substance Use Topics  . Smoking status: Former Smoker -- 5 years    Types: Cigarettes  . Smokeless tobacco: Never Used     Comment: 12/05/2011 "haven't smoked since age [age]"  . Alcohol Use: Yes     Comment: 12/05/2011 "very rarely have a beer"     Objective: Filed Vitals:   02/21/12 1323  BP: 102/62  Pulse: 79    General: Alert and Oriented, No Acute Distress HEENT: Pupils equal, round, reactive to light. Conjunctivae clear.  Moist mucous membranes Lungs: Clear and possible work of breathing Cardiac: Regular rate and rhythm. \ Extremities: No peripheral edema.  Strong peripheral pulses.  Mental Status: No depression, anxiety, nor agitation. Skin: Warm and dry.  Assessment & Plan: Roberto Day was seen today for discuss meds.  Diagnoses and associated orders for this visit:  Polypharmacy  Diarrhea  Essential hypertension, benign  Patient, family and myself had a long lengthy discussion clarify and multiple misconceptions as to the purpose of multiple medications. He believes that many of his medications are for blood pressure or his prostate which required lengthy explanation and clarifications.  We will attempt to get him a GI referral in [place] and sooner than March appointment. We discussed that his elevated blood pressure may be due to incomplete absorption of doxazosin while having diarrhea and dehydration along with Aleve use causing renal failure and subsequent hypertension. I've encouraged him to stop Aleve as much as possible,  focus on tramadol for pain.   He is motivated to cut back on medications as much is possible. We discussed him stopping Flomax to see if any urinary symptoms occurred and if so restart immediately.  45 minutes spent face-to-face during visit today of which at 100% was counseling or coordinating care regarding polypharmacy, diarrhea, essential hypertension, and clarification of what each medication was medically treating.   Return in about 1 week (around 02/28/2012).

## 2012-02-29 ENCOUNTER — Encounter: Payer: Self-pay | Admitting: Family Medicine

## 2012-03-05 ENCOUNTER — Encounter: Payer: Self-pay | Admitting: Family Medicine

## 2012-03-05 DIAGNOSIS — K5730 Diverticulosis of large intestine without perforation or abscess without bleeding: Secondary | ICD-10-CM | POA: Insufficient documentation

## 2012-03-08 ENCOUNTER — Encounter: Payer: Self-pay | Admitting: Family Medicine

## 2012-03-16 ENCOUNTER — Encounter: Payer: Self-pay | Admitting: Family Medicine

## 2012-03-16 ENCOUNTER — Other Ambulatory Visit: Payer: Self-pay | Admitting: *Deleted

## 2012-03-16 MED ORDER — HYDROCODONE-ACETAMINOPHEN 5-325 MG PO TABS: 1.0000 | ORAL_TABLET | Freq: Four times a day (QID) | ORAL | Status: DC | PRN

## 2012-03-16 NOTE — Addendum Note (Signed)
Addended by: Laren Boom on: 03/16/2012 11:52 AM   Modules accepted: Orders

## 2012-03-20 ENCOUNTER — Encounter: Payer: Self-pay | Admitting: Family Medicine

## 2012-03-27 ENCOUNTER — Encounter: Payer: Self-pay | Admitting: Family Medicine

## 2012-03-30 ENCOUNTER — Encounter: Payer: Self-pay | Admitting: Family Medicine

## 2012-04-19 ENCOUNTER — Encounter: Payer: Self-pay | Admitting: Family Medicine

## 2012-04-19 ENCOUNTER — Telehealth: Payer: Self-pay | Admitting: *Deleted

## 2012-04-19 NOTE — Telephone Encounter (Signed)
Pt's daughter called and wanted to know if you would be willing to  write a letter allowing them to have darker tint on their car windows because of Roberto Day' macular degeneration. Roberto Day went to get his car inspected yesterday and it didn't pass inspection because of the dark tint.

## 2012-04-19 NOTE — Telephone Encounter (Signed)
Mailed letter and left message on daughters vm

## 2012-04-19 NOTE — Telephone Encounter (Signed)
Roberto Day, Letter written and placed in you inbox.

## 2012-04-23 ENCOUNTER — Encounter: Payer: Self-pay | Admitting: *Deleted

## 2012-05-04 ENCOUNTER — Encounter: Payer: Self-pay | Admitting: Family Medicine

## 2012-05-04 ENCOUNTER — Ambulatory Visit (INDEPENDENT_AMBULATORY_CARE_PROVIDER_SITE_OTHER): Payer: Medicare Other | Admitting: Family Medicine

## 2012-05-04 VITALS — BP 111/72 | HR 88 | Wt 155.0 lb

## 2012-05-04 DIAGNOSIS — N400 Enlarged prostate without lower urinary tract symptoms: Secondary | ICD-10-CM

## 2012-05-04 DIAGNOSIS — M170 Bilateral primary osteoarthritis of knee: Secondary | ICD-10-CM

## 2012-05-04 DIAGNOSIS — M1710 Unilateral primary osteoarthritis, unspecified knee: Secondary | ICD-10-CM

## 2012-05-04 DIAGNOSIS — R972 Elevated prostate specific antigen [PSA]: Secondary | ICD-10-CM

## 2012-05-04 DIAGNOSIS — R197 Diarrhea, unspecified: Secondary | ICD-10-CM

## 2012-05-04 NOTE — Progress Notes (Signed)
CC: Roberto Day. is a 79 y.o. male is here for f/u medication management   Subjective: HPI:  Patient returns for followup of bilateral knee pain. This has been present for over a decade. He carries a diagnosis of osteoarthritis bilaterally. He describes a soreness in both knees that is moderate in severity. Seems to be worse first thing in the morning and after periods of inactivity. Pain is nonradiating, improves with Aleve however this was causing diarrhea. Has been using tramadol with mild improvement of pain. He is considering a laser procedure trying to avoid surgery. He has had fantastic improvement of knee pain relief with corticosteroid injections with me and other physicians. Most recent one was over 3 months ago. Denies catching popping locking or giving way of either knee. Denies falls or instability. Denies joint swelling redness nor warmth. Denies fevers, chills, nausea.  Followup of BPH with elevated PSA. Most recent PSA 6.5 in July when I first met him. Currently taking doxazosin and Flomax. Reports urinating no more than 8 times a day and never more than once at night. Denies urinary hesitancy, urgency, incomplete voiding, nor weak stream.  Mentions that he feels lightheaded when he stands up quickly. He is unsure how long this is been going on. It lasts only seconds  He has a history of a murmur and denies chest pain, shortness of breath, orthopnea, nor peripheral edema  Review Of Systems Outlined In HPI  Past Medical History  Diagnosis Date  . Unintentional weight loss 10/07/2011  . BPH (benign prostatic hyperplasia) 10/07/2011  . Hematuria 10/07/2011  . Bladder stone 10/07/2011  . Heart murmur   . Hyperlipidemia   . Bladder infection     hx of  . GERD (gastroesophageal reflux disease)     only take medication as needed  . Arthritis     "bilateral knees and right shoulder"  . Hypertension     Dr. Laren Boom, [facility]  . Pneumonia     "had it very  frequently when I was a child" (12/05/2011)  . Nitrate poisoning 1945  . Stomach ulcer 1945    "caused by nitrate poisoning" (12/05/2011)  . Migraines     "went away inthe early 1940's" (12/05/2011)     Family History  Problem Relation Age of Onset  . Heart attack Mother   . Stroke Mother      History  Substance Use Topics  . Smoking status: Former Smoker -- 5 years    Types: Cigarettes  . Smokeless tobacco: Never Used     Comment: 12/05/2011 "haven't smoked since age 24-26"  . Alcohol Use: Yes     Comment: 12/05/2011 "very rarely have a beer"     Objective: Filed Vitals:   05/04/12 0956  BP: 111/72  Pulse: 88    General: Alert and Oriented, No Acute Distress HEENT: Pupils equal, round, reactive to light. Conjunctivae clear.  Moist mucous membranes Lungs: Clear to auscultation bilaterally, no wheezing/ronchi/rales.  Comfortable work of breathing. Good air movement. Cardiac: Regular rate and rhythm. Normal S1/S2.  No  rubs, nor gallops.  Grade 2 systolic murmur crescendo decrescendo with no carotid bruit Abdomen: Soft nontender to palpation Extremities: No peripheral edema.  Strong peripheral pulses.  Full range of motion and strength in both knees Mental Status: No depression, anxiety, nor agitation. Skin: Warm and dry.  Assessment & Plan: Roberto Day was seen today for f/u medication management.  Diagnoses and associated orders for this visit:  Diarrhea  BPH (benign prostatic hyperplasia)  Elevated PSA (6.1 05/2011), (6.5 07/2011) - PSA, Medicare  Osteoarthritis of both knees    Diarrhea: Resolved, continue to avoid Aleve if possible BPH and elevated PSA: Due for serial PSAs for prostate cancer surveillance Osteoarthritis both knees: Worsened, Patient would like corticosteroid injections in both  Return in about 3 months (around 08/04/2012).  Knee  Injection Procedure Note  Pre-operative Diagnosis: left and right knee OA  Post-operative Diagnosis:  same  Indications: bilateral joint pain  Anesthesia: none  Procedure Details   Verbal consent was obtained for the procedure. The joints were prepped with alcohol. A 22 gauge needle was inserted into the lateral inferior aspect of the joint from a lateral approach. 3ml 1% lidocaine and2 ml of triamcinolone (KENALOG) 40mg /ml was then injected into the joint through the same needle. The needle was removed and the area cleansed and dressed. This is performed bilaterally  Complications:  None; patient tolerated the procedure well.

## 2012-05-05 LAB — PSA, MEDICARE: PSA: 7.53 ng/mL — ABNORMAL HIGH (ref <=4.00)

## 2012-05-07 ENCOUNTER — Encounter: Payer: Self-pay | Admitting: Family Medicine

## 2012-06-08 ENCOUNTER — Other Ambulatory Visit: Payer: Self-pay

## 2012-06-08 DIAGNOSIS — M549 Dorsalgia, unspecified: Secondary | ICD-10-CM

## 2012-06-08 DIAGNOSIS — K219 Gastro-esophageal reflux disease without esophagitis: Secondary | ICD-10-CM

## 2012-06-08 MED ORDER — HYDROCODONE-ACETAMINOPHEN 5-325 MG PO TABS: 1.0000 | ORAL_TABLET | Freq: Four times a day (QID) | ORAL | Status: DC | PRN

## 2012-06-08 MED ORDER — RANITIDINE HCL 150 MG PO TABS: 150.0000 mg | ORAL_TABLET | Freq: Two times a day (BID) | ORAL | Status: DC

## 2012-06-08 NOTE — Telephone Encounter (Signed)
Zantac has never been prescribed by Dr Ivan Anchors.

## 2012-06-08 NOTE — Telephone Encounter (Signed)
Andrea/Anglea, Rxs printed placed in inbox ready for fax/pickup.

## 2012-06-20 ENCOUNTER — Encounter: Payer: Self-pay | Admitting: Family Medicine

## 2012-06-20 DIAGNOSIS — M170 Bilateral primary osteoarthritis of knee: Secondary | ICD-10-CM | POA: Insufficient documentation

## 2012-06-27 ENCOUNTER — Encounter: Payer: Self-pay | Admitting: Sports Medicine

## 2012-06-27 ENCOUNTER — Ambulatory Visit (INDEPENDENT_AMBULATORY_CARE_PROVIDER_SITE_OTHER): Payer: Medicare Other | Admitting: Sports Medicine

## 2012-06-27 VITALS — BP 97/58 | HR 81 | Wt 153.0 lb

## 2012-06-27 DIAGNOSIS — M19019 Primary osteoarthritis, unspecified shoulder: Secondary | ICD-10-CM

## 2012-06-27 DIAGNOSIS — M1710 Unilateral primary osteoarthritis, unspecified knee: Secondary | ICD-10-CM

## 2012-06-27 DIAGNOSIS — M170 Bilateral primary osteoarthritis of knee: Secondary | ICD-10-CM

## 2012-06-27 DIAGNOSIS — M19011 Primary osteoarthritis, right shoulder: Secondary | ICD-10-CM | POA: Insufficient documentation

## 2012-06-27 DIAGNOSIS — IMO0002 Reserved for concepts with insufficient information to code with codable children: Secondary | ICD-10-CM

## 2012-06-27 NOTE — Assessment & Plan Note (Signed)
Glenohumeral joint injection as above. The shoulder is quite end-stage, however I do not think there is enough bone for placement of the scapular component of a reverse shoulder arthroplasty device. Return as needed for this.

## 2012-06-27 NOTE — Assessment & Plan Note (Addendum)
Supartz injection #1 of 5 performed today with steroid. Return in one week for #2 and both knees. Knee brace is given for both sides.

## 2012-06-27 NOTE — Progress Notes (Signed)
Subjective:    I'm seeing this patient as a consultation for:  Dr. Ivan Anchors  CC: Knee pain  HPI: Bilateral knee osteoarthritis:  Present for a decade, pain is localized over the medial joint lines, no mechanical symptoms. He has had multiple injections in the past, his most recent was approximately 2 months ago. Unfortunately she had only a brief response, he has had viscous supplementation the past that provided a good response, and is referred to me for consideration of this toe. Pain is localized, doesn't radiate, moderate.  Right shoulder pain: History of glenohumeral DJD him a he has been told it is too late for reverse total shoulder arthroplasty. Pain is localized to the joint line, worse with essentially any movement, associated with significant grinding and pain.  Past medical history, Surgical history, Family history not pertinant except as noted below, Social history, Allergies, and medications have been entered into the medical record, reviewed, and no changes needed.   Review of Systems: No headache, visual changes, nausea, vomiting, diarrhea, constipation, dizziness, abdominal pain, skin rash, fevers, chills, night sweats, weight loss, swollen lymph nodes, body aches, joint swelling, muscle aches, chest pain, shortness of breath, mood changes, visual or auditory hallucinations.   Objective:   General: Well Developed, well nourished, and in no acute distress.  Neuro/Psych: Alert and oriented x3, extra-ocular muscles intact, able to move all 4 extremities, sensation grossly intact. Skin: Warm and dry, no rashes noted.  Respiratory: Not using accessory muscles, speaking in full sentences, trachea midline.  Cardiovascular: Pulses palpable, no extremity edema. Abdomen: Does not appear distended. Right shoulder: Very limited range of motion with approximately 50 of abduction, 70 of external rotation, 30 of forward flexion. Both knees: Minimally swollen, varus deformity, tender to  palpation at the medial and lateral joint lines. Significant grind with range of motion.  Procedure: Real-time Ultrasound Guided Injection of left knee Device: GE Logiq E  Verbal informed consent obtained.  Time-out conducted.  Noted no overlying erythema, induration, or other signs of local infection.  Skin prepped in a sterile fashion.  Local anesthesia: Topical Ethyl chloride.  With sterile technique and under real time ultrasound guidance:  2 cc Kenalog 40, 4 cc lidocaine injected easily into the suprapatellar recess, syringe switched and 25 mg/2.5 mL of Supartz (sodium hyaluronate) in a prefilled syringe was injected easily into the knee through a 22-gauge needle. Completed without difficulty  Pain immediately resolved suggesting accurate placement of the medication.  Advised to call if fevers/chills, erythema, induration, drainage, or persistent bleeding.  Images permanently stored and available for review in the ultrasound unit.  Impression: Technically successful ultrasound guided injection.  Procedure: Real-time Ultrasound Guided Injection of right knee Device: GE Logiq E  Verbal informed consent obtained.  Time-out conducted.  Noted no overlying erythema, induration, or other signs of local infection.  Skin prepped in a sterile fashion.  Local anesthesia: Topical Ethyl chloride.  With sterile technique and under real time ultrasound guidance:  2 cc Kenalog 40, 4 cc lidocaine injected easily into the suprapatellar recess, syringe switched and 25 mg/2.5 mL of Supartz (sodium hyaluronate) in a prefilled syringe was injected easily into the knee through a 22-gauge needle. Completed without difficulty  Pain immediately resolved suggesting accurate placement of the medication.  Advised to call if fevers/chills, erythema, induration, drainage, or persistent bleeding.  Images permanently stored and available for review in the ultrasound unit.  Impression: Technically successful  ultrasound guided injection.  Procedure: Real-time Ultrasound Guided Injection of right  glenohumeral joint Device: GE Logiq E  Verbal informed consent obtained.  Time-out conducted.  Noted no overlying erythema, induration, or other signs of local infection.  Skin prepped in a sterile fashion.  Local anesthesia: Topical Ethyl chloride.  With sterile technique and under real time ultrasound guidance:  Joint was deformed, with significant arthritic change, spinal needle was able to be advanced into the joint under guidance. 1 cc Kenalog 40, 4 cc lidocaine injected easily. Completed without difficulty  Pain immediately resolved suggesting accurate placement of the medication.  Advised to call if fevers/chills, erythema, induration, drainage, or persistent bleeding.  Images permanently stored and available for review in the ultrasound unit.  Impression: Technically successful ultrasound guided injection.  Impression and Recommendations:   This case required medical decision making of moderate complexity.

## 2012-07-04 ENCOUNTER — Ambulatory Visit (INDEPENDENT_AMBULATORY_CARE_PROVIDER_SITE_OTHER): Payer: Medicare Other | Admitting: Sports Medicine

## 2012-07-04 ENCOUNTER — Encounter: Payer: Self-pay | Admitting: Sports Medicine

## 2012-07-04 VITALS — BP 120/70 | HR 84 | Wt 153.0 lb

## 2012-07-04 DIAGNOSIS — M170 Bilateral primary osteoarthritis of knee: Secondary | ICD-10-CM

## 2012-07-04 DIAGNOSIS — M19011 Primary osteoarthritis, right shoulder: Secondary | ICD-10-CM

## 2012-07-04 DIAGNOSIS — M1710 Unilateral primary osteoarthritis, unspecified knee: Secondary | ICD-10-CM

## 2012-07-04 DIAGNOSIS — IMO0002 Reserved for concepts with insufficient information to code with codable children: Secondary | ICD-10-CM

## 2012-07-04 NOTE — Progress Notes (Signed)
  Procedure: Real-time Ultrasound Guided Injection of left knee Device: GE Logiq E  Verbal informed consent obtained.  Time-out conducted.  Noted no overlying erythema, induration, or other signs of local infection.  Skin prepped in a sterile fashion.  Local anesthesia: Topical Ethyl chloride.  With sterile technique and under real time ultrasound guidance:  25 mg/2.5 mL of Supartz (sodium hyaluronate) in a prefilled syringe was injected easily into the knee through a 22-gauge needle. Completed without difficulty  Pain immediately resolved suggesting accurate placement of the medication.  Advised to call if fevers/chills, erythema, induration, drainage, or persistent bleeding.  Images permanently stored and available for review in the ultrasound unit.  Impression: Technically successful ultrasound guided injection.  Procedure: Real-time Ultrasound Guided Injection of right knee Device: GE Logiq E  Verbal informed consent obtained.  Time-out conducted.  Noted no overlying erythema, induration, or other signs of local infection.  Skin prepped in a sterile fashion.  Local anesthesia: Topical Ethyl chloride.  With sterile technique and under real time ultrasound guidance:  25 mg/2.5 mL of Supartz (sodium hyaluronate) in a prefilled syringe was injected easily into the knee through a 22-gauge needle. Completed without difficulty  Pain immediately resolved suggesting accurate placement of the medication.  Advised to call if fevers/chills, erythema, induration, drainage, or persistent bleeding.  Images permanently stored and available for review in the ultrasound unit.  Impression: Technically successful ultrasound guided injection. 

## 2012-07-04 NOTE — Assessment & Plan Note (Signed)
Supartz injection #2 of 5 into both knees. Return in one week for #3. 

## 2012-07-04 NOTE — Assessment & Plan Note (Signed)
Pain is completely resolved after glenohumeral joint injection at the last visit.

## 2012-07-11 ENCOUNTER — Encounter: Payer: Self-pay | Admitting: Sports Medicine

## 2012-07-11 ENCOUNTER — Ambulatory Visit (INDEPENDENT_AMBULATORY_CARE_PROVIDER_SITE_OTHER): Payer: Medicare Other | Admitting: Sports Medicine

## 2012-07-11 VITALS — BP 106/70 | HR 87 | Wt 154.0 lb

## 2012-07-11 DIAGNOSIS — IMO0002 Reserved for concepts with insufficient information to code with codable children: Secondary | ICD-10-CM

## 2012-07-11 DIAGNOSIS — M1710 Unilateral primary osteoarthritis, unspecified knee: Secondary | ICD-10-CM

## 2012-07-11 DIAGNOSIS — M19011 Primary osteoarthritis, right shoulder: Secondary | ICD-10-CM

## 2012-07-11 DIAGNOSIS — M170 Bilateral primary osteoarthritis of knee: Secondary | ICD-10-CM

## 2012-07-11 MED ORDER — HYDROCODONE-ACETAMINOPHEN 7.5-325 MG PO TABS: 1.0000 | ORAL_TABLET | Freq: Three times a day (TID) | ORAL | Status: DC | PRN

## 2012-07-11 NOTE — Progress Notes (Signed)
  Procedure: Real-time Ultrasound Guided Injection of left knee Device: GE Logiq E  Verbal informed consent obtained.  Time-out conducted.  Noted no overlying erythema, induration, or other signs of local infection.  Skin prepped in a sterile fashion.  Local anesthesia: Topical Ethyl chloride.  With sterile technique and under real time ultrasound guidance:  25 mg/2.5 mL of Supartz (sodium hyaluronate) in a prefilled syringe was injected easily into the knee through a 22-gauge needle. Completed without difficulty  Pain immediately resolved suggesting accurate placement of the medication.  Advised to call if fevers/chills, erythema, induration, drainage, or persistent bleeding.  Images permanently stored and available for review in the ultrasound unit.  Impression: Technically successful ultrasound guided injection.  Procedure: Real-time Ultrasound Guided Injection of right knee Device: GE Logiq E  Verbal informed consent obtained.  Time-out conducted.  Noted no overlying erythema, induration, or other signs of local infection.  Skin prepped in a sterile fashion.  Local anesthesia: Topical Ethyl chloride.  With sterile technique and under real time ultrasound guidance:  25 mg/2.5 mL of Supartz (sodium hyaluronate) in a prefilled syringe was injected easily into the knee through a 22-gauge needle. Completed without difficulty  Pain immediately resolved suggesting accurate placement of the medication.  Advised to call if fevers/chills, erythema, induration, drainage, or persistent bleeding.  Images permanently stored and available for review in the ultrasound unit.  Impression: Technically successful ultrasound guided injection. 

## 2012-07-11 NOTE — Assessment & Plan Note (Signed)
Desires repeat glenohumeral joint injection, will return for this.

## 2012-07-11 NOTE — Assessment & Plan Note (Signed)
Supartz injection into both knees, #3 of 5. Return in one week for #4 of 5. New knee braces given. Refilling hydrocodone.

## 2012-07-13 ENCOUNTER — Telehealth: Payer: Self-pay

## 2012-07-18 ENCOUNTER — Encounter: Payer: Self-pay | Admitting: Sports Medicine

## 2012-07-18 ENCOUNTER — Ambulatory Visit (INDEPENDENT_AMBULATORY_CARE_PROVIDER_SITE_OTHER): Payer: Medicare Other | Admitting: Sports Medicine

## 2012-07-18 VITALS — BP 95/61 | HR 94 | Wt 154.0 lb

## 2012-07-18 DIAGNOSIS — M19019 Primary osteoarthritis, unspecified shoulder: Secondary | ICD-10-CM

## 2012-07-18 DIAGNOSIS — M19011 Primary osteoarthritis, right shoulder: Secondary | ICD-10-CM

## 2012-07-18 DIAGNOSIS — M170 Bilateral primary osteoarthritis of knee: Secondary | ICD-10-CM

## 2012-07-18 DIAGNOSIS — M1710 Unilateral primary osteoarthritis, unspecified knee: Secondary | ICD-10-CM

## 2012-07-18 DIAGNOSIS — IMO0002 Reserved for concepts with insufficient information to code with codable children: Secondary | ICD-10-CM

## 2012-07-18 NOTE — Assessment & Plan Note (Signed)
Supartz injection into both knees, #4 of 5. Return in one week for #5 of 5.

## 2012-07-18 NOTE — Assessment & Plan Note (Addendum)
Glenohumeral joint injection as above.

## 2012-07-18 NOTE — Progress Notes (Signed)
  Subjective:    CC: Knee pain, shoulder pain  HPI: Bilateral knee osteoarthritis:  Here for Supartz injections #4 of 5 into both knees, is noting a good benefit.  Right glenohumeral arthritis: Desires a repeat glenohumeral joint injection. Pain is localized, doesn't radiate, moderate.  Past medical history, Surgical history, Family history not pertinant except as noted below, Social history, Allergies, and medications have been entered into the medical record, reviewed, and no changes needed.   Review of Systems: No fevers, chills, night sweats, weight loss, chest pain, or shortness of breath.   Objective:    General: Well Developed, well nourished, and in no acute distress.  Neuro: Alert and oriented x3, extra-ocular muscles intact, sensation grossly intact.  HEENT: Normocephalic, atraumatic, pupils equal round reactive to light, neck supple, no masses, no lymphadenopathy, thyroid nonpalpable.  Skin: Warm and dry, no rashes. Cardiac: Regular rate and rhythm, no murmurs rubs or gallops, no lower extremity edema.  Respiratory: Clear to auscultation bilaterally. Not using accessory muscles, speaking in full sentences.  Procedure: Real-time Ultrasound Guided Injection of left knee Device: GE Logiq E  Verbal informed consent obtained.  Time-out conducted.  Noted no overlying erythema, induration, or other signs of local infection.  Skin prepped in a sterile fashion.  Local anesthesia: Topical Ethyl chloride.  With sterile technique and under real time ultrasound guidance:  25 mg/2.5 mL of Supartz (sodium hyaluronate) in a prefilled syringe was injected easily into the knee through a 22-gauge needle. Completed without difficulty  Pain immediately resolved suggesting accurate placement of the medication.  Advised to call if fevers/chills, erythema, induration, drainage, or persistent bleeding.  Images permanently stored and available for review in the ultrasound unit.  Impression:  Technically successful ultrasound guided injection.  Procedure: Real-time Ultrasound Guided Injection of right knee Device: GE Logiq E  Verbal informed consent obtained.  Time-out conducted.  Noted no overlying erythema, induration, or other signs of local infection.  Skin prepped in a sterile fashion.  Local anesthesia: Topical Ethyl chloride.  With sterile technique and under real time ultrasound guidance:  25 mg/2.5 mL of Supartz (sodium hyaluronate) in a prefilled syringe was injected easily into the knee through a 22-gauge needle. Completed without difficulty  Pain immediately resolved suggesting accurate placement of the medication.  Advised to call if fevers/chills, erythema, induration, drainage, or persistent bleeding.  Images permanently stored and available for review in the ultrasound unit.  Impression: Technically successful ultrasound guided injection.  Procedure: Real-time Ultrasound Guided Injection of  right glenohumeral joint Device: GE Logiq E  Verbal informed consent obtained.  Time-out conducted.  Noted no overlying erythema, induration, or other signs of local infection.  Skin prepped in a sterile fashion.  Local anesthesia: Topical Ethyl chloride.  With sterile technique and under real time ultrasound guidance:  Spinal needle advanced into the joint, 1 cc Kenalog 40, 5 cc lidocaine injected easily. Completed without difficulty  Pain immediately resolved suggesting accurate placement of the medication.  Advised to call if fevers/chills, erythema, induration, drainage, or persistent bleeding.  Images permanently stored and available for review in the ultrasound unit.  Impression: Technically successful ultrasound guided injection.  Impression and Recommendations:

## 2012-07-25 ENCOUNTER — Encounter: Payer: Self-pay | Admitting: Sports Medicine

## 2012-07-25 ENCOUNTER — Ambulatory Visit (INDEPENDENT_AMBULATORY_CARE_PROVIDER_SITE_OTHER): Payer: Medicare Other | Admitting: Sports Medicine

## 2012-07-25 VITALS — BP 107/77 | HR 86 | Wt 154.0 lb

## 2012-07-25 DIAGNOSIS — M19011 Primary osteoarthritis, right shoulder: Secondary | ICD-10-CM

## 2012-07-25 DIAGNOSIS — IMO0002 Reserved for concepts with insufficient information to code with codable children: Secondary | ICD-10-CM

## 2012-07-25 DIAGNOSIS — M19019 Primary osteoarthritis, unspecified shoulder: Secondary | ICD-10-CM

## 2012-07-25 DIAGNOSIS — M170 Bilateral primary osteoarthritis of knee: Secondary | ICD-10-CM

## 2012-07-25 DIAGNOSIS — M1710 Unilateral primary osteoarthritis, unspecified knee: Secondary | ICD-10-CM

## 2012-07-25 NOTE — Progress Notes (Signed)
  Subjective:    CC: Knee pain, shoulder pain  HPI: Bilateral knee osteoarthritis:  Here for Supartz injections #5 of 5 into both knees, feels about the same as at the last visit.  Glenohumeral joint arthritis: Overall doing better after injection at the last visit.  Past medical history, Surgical history, Family history not pertinant except as noted below, Social history, Allergies, and medications have been entered into the medical record, reviewed, and no changes needed.   Review of Systems: No fevers, chills, night sweats, weight loss, chest pain, or shortness of breath.   Objective:    General: Well Developed, well nourished, and in no acute distress.  Neuro: Alert and oriented x3, extra-ocular muscles intact, sensation grossly intact.  HEENT: Normocephalic, atraumatic, pupils equal round reactive to light, neck supple, no masses, no lymphadenopathy, thyroid nonpalpable.  Skin: Warm and dry, no rashes. Cardiac: Regular rate and rhythm, no murmurs rubs or gallops, no lower extremity edema.  Respiratory: Clear to auscultation bilaterally. Not using accessory muscles, speaking in full sentences.  Procedure: Real-time Ultrasound Guided Injection of left knee Device: GE Logiq E  Verbal informed consent obtained.  Time-out conducted.  Noted no overlying erythema, induration, or other signs of local infection.  Skin prepped in a sterile fashion.  Local anesthesia: Topical Ethyl chloride.  With sterile technique and under real time ultrasound guidance:  25 mg/2.5 mL of Supartz (sodium hyaluronate) in a prefilled syringe was injected easily into the knee through a 22-gauge needle. Completed without difficulty  Pain immediately resolved suggesting accurate placement of the medication.  Advised to call if fevers/chills, erythema, induration, drainage, or persistent bleeding.  Images permanently stored and available for review in the ultrasound unit.  Impression: Technically successful  ultrasound guided injection.  Procedure: Real-time Ultrasound Guided Injection of right knee Device: GE Logiq E  Verbal informed consent obtained.  Time-out conducted.  Noted no overlying erythema, induration, or other signs of local infection.  Skin prepped in a sterile fashion.  Local anesthesia: Topical Ethyl chloride.  With sterile technique and under real time ultrasound guidance:  25 mg/2.5 mL of Supartz (sodium hyaluronate) in a prefilled syringe was injected easily into the knee through a 22-gauge needle. Completed without difficulty  Pain immediately resolved suggesting accurate placement of the medication.  Advised to call if fevers/chills, erythema, induration, drainage, or persistent bleeding.  Images permanently stored and available for review in the ultrasound unit.  Impression: Technically successful ultrasound guided injection.   Impression and Recommendations:

## 2012-07-25 NOTE — Assessment & Plan Note (Signed)
Supartz injection #5 of 5 given to both knees today. Return in one month to see how he's doing, I do suspect he will need to proceed to total knee arthroplasty.

## 2012-07-25 NOTE — Assessment & Plan Note (Signed)
Doing well after injection. 

## 2012-07-30 ENCOUNTER — Ambulatory Visit: Payer: Medicare Other | Admitting: Family Medicine

## 2012-08-06 ENCOUNTER — Encounter: Payer: Self-pay | Admitting: Family Medicine

## 2012-08-06 ENCOUNTER — Ambulatory Visit (INDEPENDENT_AMBULATORY_CARE_PROVIDER_SITE_OTHER): Payer: Medicare Other | Admitting: Family Medicine

## 2012-08-06 VITALS — BP 92/56 | HR 92 | Wt 159.0 lb

## 2012-08-06 DIAGNOSIS — M170 Bilateral primary osteoarthritis of knee: Secondary | ICD-10-CM

## 2012-08-06 DIAGNOSIS — H9190 Unspecified hearing loss, unspecified ear: Secondary | ICD-10-CM

## 2012-08-06 DIAGNOSIS — M19011 Primary osteoarthritis, right shoulder: Secondary | ICD-10-CM

## 2012-08-06 DIAGNOSIS — H409 Unspecified glaucoma: Secondary | ICD-10-CM

## 2012-08-06 DIAGNOSIS — M5134 Other intervertebral disc degeneration, thoracic region: Secondary | ICD-10-CM

## 2012-08-06 DIAGNOSIS — H9193 Unspecified hearing loss, bilateral: Secondary | ICD-10-CM

## 2012-08-06 DIAGNOSIS — IMO0002 Reserved for concepts with insufficient information to code with codable children: Secondary | ICD-10-CM

## 2012-08-06 DIAGNOSIS — M19019 Primary osteoarthritis, unspecified shoulder: Secondary | ICD-10-CM

## 2012-08-06 DIAGNOSIS — M1710 Unilateral primary osteoarthritis, unspecified knee: Secondary | ICD-10-CM

## 2012-08-06 DIAGNOSIS — R011 Cardiac murmur, unspecified: Secondary | ICD-10-CM

## 2012-08-06 MED ORDER — HYDROCODONE-ACETAMINOPHEN 7.5-325 MG PO TABS: 1.0000 | ORAL_TABLET | Freq: Three times a day (TID) | ORAL | Status: DC | PRN

## 2012-08-06 NOTE — Progress Notes (Signed)
CC: Roberto Day. is a 79 y.o. male is here for 3 month f/u   Subjective: HPI:  Followup left knee pain: He has had all injections of Supartz and reports mild improvement of knee pain but still bothering quality of life. Sports medicine provided him with increased Norco however he has not filled this yet due to financial complications with his new prescription. He denies new swelling locking catching or giving way of the left knee. Symptoms are still moderate on a daily basis worse the longer he stands.  Followup glaucoma: It has come to my attention he is not established with ophthalmology since moving here. He denies any change in vision nor ocular pain over the past year.  Followup hearing loss: Has been over year since he seen an audiologist he feels that his hearing aids are not working as well as they used to last year this has been slowly worsening and seems to be bilateral.  Followup heart murmur: He denies any shortness of breath, edema, PND, orthopnea, nor abdominal bloating.   Review Of Systems Outlined In HPI  Past Medical History  Diagnosis Date  . Unintentional weight loss 10/07/2011  . BPH (benign prostatic hyperplasia) 10/07/2011  . Hematuria 10/07/2011  . Bladder stone 10/07/2011  . Heart murmur   . Hyperlipidemia   . Bladder infection     hx of  . GERD (gastroesophageal reflux disease)     only take medication as needed  . Arthritis     "bilateral knees and right shoulder"  . Hypertension     Dr. Laren Boom, [facility]  . Pneumonia     "had it very frequently when I was a child" (12/05/2011)  . Nitrate poisoning 1945  . Stomach ulcer 1945    "caused by nitrate poisoning" (12/05/2011)  . Migraines     "went away inthe early 1940's" (12/05/2011)     Family History  Problem Relation Age of Onset  . Heart attack Mother   . Stroke Mother      History  Substance Use Topics  . Smoking status: Former Smoker -- 5 years    Types: Cigarettes  .  Smokeless tobacco: Never Used     Comment: 12/05/2011 "haven't smoked since age 33-26"  . Alcohol Use: Yes     Comment: 12/05/2011 "very rarely have a beer"     Objective: Filed Vitals:   08/06/12 1006  BP: 92/56  Pulse: 92    General: Alert and Oriented, No Acute Distress HEENT: Pupils equal, round, reactive to light. Conjunctivae clear.  External ears unremarkable, canals clear with intact TMs with appropriate landmarks.  Middle ear appears open without effusion. Pink inferior turbinates.  Moist mucous membranes, pharynx without inflammation nor lesions.  Neck supple without palpable lymphadenopathy nor abnormal masses. Lungs: Clear to auscultation bilaterally, no wheezing/ronchi/rales.  Comfortable work of breathing. Good air movement. Cardiac: Regular rate and rhythm. Normal S1/S2.  Grade 2/6 holosystolic murmur Extremities: No peripheral edema.  Strong peripheral pulses.  Mental Status: No depression, anxiety, nor agitation. Skin: Warm and dry.  Assessment & Plan: [name] was seen today for 3 month f/u.  Diagnoses and associated orders for this visit:  Degenerative disc disease, thoracic T9/T10 (Consider MRI r/o Diskitis)  Osteoarthritis of right glenohumeral joint - HYDROcodone-acetaminophen (NORCO) 7.5-325 MG per tablet; Take 1 tablet by mouth every 8 (eight) hours as needed for pain.  Glaucoma - Ambulatory referral to Ophthalmology  Hearing loss, bilateral - Ambulatory referral to Audiology  Heart murmur  Osteoarthritis of both knees    Osteoarthritis both knees: Uncontrolled, patient has plan to return to see sports medicine at the end of the month, discussed with patient the risks of the knee surgery likely outweigh the benefits therefore we will focus on medications pending further recommendations from sports medicine. Norco PRN. Glaucoma: Clinically controlled chronic condition however he is overdue for ophthalmology visit referral placed today Hearing loss:  Worsening chronic condition referral to audiology Heart murmur: Stable continue to follow clinically   Return in about 3 months (around 11/06/2012), or BPH.

## 2012-08-08 ENCOUNTER — Other Ambulatory Visit: Payer: Self-pay

## 2012-08-21 ENCOUNTER — Other Ambulatory Visit: Payer: Self-pay | Admitting: Family Medicine

## 2012-08-28 ENCOUNTER — Ambulatory Visit (INDEPENDENT_AMBULATORY_CARE_PROVIDER_SITE_OTHER): Payer: Medicare Other | Admitting: Sports Medicine

## 2012-08-28 ENCOUNTER — Encounter: Payer: Self-pay | Admitting: Sports Medicine

## 2012-08-28 VITALS — BP 103/63 | HR 76 | Wt 155.0 lb

## 2012-08-28 DIAGNOSIS — IMO0002 Reserved for concepts with insufficient information to code with codable children: Secondary | ICD-10-CM

## 2012-08-28 DIAGNOSIS — M1710 Unilateral primary osteoarthritis, unspecified knee: Secondary | ICD-10-CM

## 2012-08-28 DIAGNOSIS — Z23 Encounter for immunization: Secondary | ICD-10-CM

## 2012-08-28 DIAGNOSIS — M170 Bilateral primary osteoarthritis of knee: Secondary | ICD-10-CM

## 2012-08-28 NOTE — Progress Notes (Signed)
  Subjective:    CC: Followup  HPI: Bilateral knee osteoarthritis:  Roberto Day has now been through several steroid injections as well as entire series of Supartz into both knees. He noted some benefit but unfortunately he continues to have pain, and occasionally is having a locking sensation. He has been resistant to the idea of total knee arthroplasty, but at this point is considering it. Pain is localized to the joint lines, without radiation. Moderate, persistent.  Right glenohumeral arthritis: Continues to be resolved after ultrasound guided injection.  Past medical history, Surgical history, Family history not pertinant except as noted below, Social history, Allergies, and medications have been entered into the medical record, reviewed, and no changes needed.   Review of Systems: No fevers, chills, night sweats, weight loss, chest pain, or shortness of breath.   Objective:    General: Well Developed, well nourished, and in no acute distress.  Neuro: Alert and oriented x3, extra-ocular muscles intact, sensation grossly intact.  HEENT: Normocephalic, atraumatic, pupils equal round reactive to light, neck supple, no masses, no lymphadenopathy, thyroid nonpalpable.  Skin: Warm and dry, no rashes. Cardiac: Regular rate and rhythm, no murmurs rubs or gallops, no lower extremity edema.  Respiratory: Clear to auscultation bilaterally. Not using accessory muscles, speaking in full sentences. Bilateral Knee: Varus deformity bilaterally, tender to palpation at the joint lines. ROM full in flexion and extension and lower leg rotation. Ligaments with solid consistent endpoints including ACL, PCL, LCL, MCL. Negative Mcmurray's, Apley's, and Thessalonian tests. Non painful patellar compression. Patellar glide without crepitus. Patellar and quadriceps tendons unremarkable. Hamstring and quadriceps strength is normal.  Impression and Recommendations:

## 2012-08-28 NOTE — Assessment & Plan Note (Signed)
[name] has been through several steroid injections as well as a series of Supartz. He continues to have pain, and his arthritis is advanced but I do think he needs to proceed with total knee arthroplasty. He would like to stay in [place] some point assess him up with Dr. Corinna Capra. I can see him back on an as needed basis for this.

## 2012-09-22 ENCOUNTER — Other Ambulatory Visit: Payer: Self-pay | Admitting: Family Medicine

## 2012-09-27 ENCOUNTER — Encounter: Payer: Self-pay | Admitting: Sports Medicine

## 2012-10-01 ENCOUNTER — Telehealth: Payer: Self-pay | Admitting: *Deleted

## 2012-10-01 ENCOUNTER — Telehealth: Payer: Self-pay | Admitting: Family Medicine

## 2012-10-01 MED ORDER — TRAMADOL HCL 50 MG PO TABS: 50.0000 mg | ORAL_TABLET | Freq: Three times a day (TID) | ORAL | Status: DC | PRN

## 2012-10-01 NOTE — Telephone Encounter (Signed)
Pt request a rx for tramadol 50 mg TID. Sent to [facility] in [place]. I would send it but it looks like we have never sent this for him before

## 2012-10-01 NOTE — Telephone Encounter (Signed)
I'll approve this, rx in inbox

## 2012-10-01 NOTE — Telephone Encounter (Signed)
error 

## 2012-10-02 NOTE — Telephone Encounter (Signed)
Faxed yesterday

## 2012-10-31 ENCOUNTER — Other Ambulatory Visit: Payer: Self-pay | Admitting: *Deleted

## 2012-10-31 DIAGNOSIS — M19011 Primary osteoarthritis, right shoulder: Secondary | ICD-10-CM

## 2012-10-31 MED ORDER — HYDROCODONE-ACETAMINOPHEN 7.5-325 MG PO TABS: 1.0000 | ORAL_TABLET | Freq: Three times a day (TID) | ORAL | Status: DC | PRN

## 2012-11-06 ENCOUNTER — Encounter: Payer: Self-pay | Admitting: Family Medicine

## 2012-11-06 ENCOUNTER — Ambulatory Visit (INDEPENDENT_AMBULATORY_CARE_PROVIDER_SITE_OTHER): Payer: Medicare Other | Admitting: Family Medicine

## 2012-11-06 VITALS — BP 132/79 | HR 85 | Ht 66.0 in | Wt 156.0 lb

## 2012-11-06 DIAGNOSIS — R972 Elevated prostate specific antigen [PSA]: Secondary | ICD-10-CM

## 2012-11-06 DIAGNOSIS — E785 Hyperlipidemia, unspecified: Secondary | ICD-10-CM

## 2012-11-06 DIAGNOSIS — G8929 Other chronic pain: Secondary | ICD-10-CM

## 2012-11-06 DIAGNOSIS — Z79899 Other long term (current) drug therapy: Secondary | ICD-10-CM

## 2012-11-06 DIAGNOSIS — N400 Enlarged prostate without lower urinary tract symptoms: Secondary | ICD-10-CM

## 2012-11-06 DIAGNOSIS — R011 Cardiac murmur, unspecified: Secondary | ICD-10-CM

## 2012-11-06 DIAGNOSIS — M549 Dorsalgia, unspecified: Secondary | ICD-10-CM

## 2012-11-06 DIAGNOSIS — Z131 Encounter for screening for diabetes mellitus: Secondary | ICD-10-CM

## 2012-11-06 DIAGNOSIS — Z125 Encounter for screening for malignant neoplasm of prostate: Secondary | ICD-10-CM

## 2012-11-06 DIAGNOSIS — Z5181 Encounter for therapeutic drug level monitoring: Secondary | ICD-10-CM

## 2012-11-06 MED ORDER — TRAMADOL HCL 50 MG PO TABS: 50.0000 mg | ORAL_TABLET | Freq: Three times a day (TID) | ORAL | Status: DC | PRN

## 2012-11-06 NOTE — Progress Notes (Signed)
CC: NASIER THUMM Sr. is a 79 y.o. male is here for 3 month f/u   Subjective: HPI:  Following BPH: Continues on doxazosin and Flomax denies waking to urinate more than once at night, weak stream, sensation of incomplete voiding, nor urinary frequency.  He does have a history of an elevated PSA stemming back to 2013 which has been stable he denies any penile or testicular pain or pelvic pain or any new bone or joint pain. Denies unintentional weight loss or blood in urine  Followup hyperlipidemia: Continues to take Lipitor daily basis without right upper quadrant pain nor myalgias tries to watch what he eats with respect to cholesterol and trying to stick to a low-fat diet.  Followup chronic back pain: Describes chronic low back pain in the midline that has not changed care to her severity since onset years ago. He denies radiation of pain, awakening from pain at night, motor sensory disturbances, bowel or bladder incontinence nor saddle paresthesia. Pain is improved with occasional tramadol and 3 times a day hydrocodone.  History of murmur without any shortness of breath or, bloating. He does admit to lower extremity edema has been more noticeable over the past 1-3 months. Present worse at the end of the day slightly improved after keeping legs elevated overnight it is always symmetric. Denies orthopnea nor PND   Review Of Systems Outlined In HPI  Past Medical History  Diagnosis Date  . Unintentional weight loss 10/07/2011  . BPH (benign prostatic hyperplasia) 10/07/2011  . Hematuria 10/07/2011  . Bladder stone 10/07/2011  . Heart murmur   . Hyperlipidemia   . Bladder infection     hx of  . GERD (gastroesophageal reflux disease)     only take medication as needed  . Arthritis     "bilateral knees and right shoulder"  . Hypertension     Dr. Laren Boom, [facility]  . Pneumonia     "had it very frequently when I was a child" (12/05/2011)  . Nitrate poisoning 1945  .  Stomach ulcer 1945    "caused by nitrate poisoning" (12/05/2011)  . Migraines     "went away inthe early 1940's" (12/05/2011)     Family History  Problem Relation Age of Onset  . Heart attack Mother   . Stroke Mother      History  Substance Use Topics  . Smoking status: Former Smoker -- 5 years    Types: Cigarettes  . Smokeless tobacco: Never Used     Comment: 12/05/2011 "haven't smoked since age [age]"  . Alcohol Use: Yes     Comment: 12/05/2011 "very rarely have a beer"     Objective: Filed Vitals:   11/06/12 1016  BP: 132/79  Pulse: 85    General: Alert and Oriented, No Acute Distress HEENT: Pupils equal, round, reactive to light. Conjunctivae clear.  Moist mucous membranes pharynx unremarkable Lungs: Clear to auscultation bilaterally, no wheezing/ronchi/rales.  Comfortable work of breathing. Good air movement. Cardiac: Regular rate and rhythm. Normal S1/S2.  Crescendo decrescendo holosystolic murmur in the left second intercostal space nonradiating Abdomen: Soft nontender Extremities: No peripheral edema.  Strong peripheral pulses.  Mental Status: No depression, anxiety, nor agitation. Skin: Warm and dry.  Assessment & Plan: Makael was seen today for 3 month f/u.  Diagnoses and associated orders for this visit:  Screening for prostate cancer - PSA  Diabetes mellitus screening - COMPLETE METABOLIC PANEL WITH GFR  Encounter for monitoring statin therapy - Lipid panel -  COMPLETE METABOLIC PANEL WITH GFR  Hyperlipidemia - Lipid panel  BPH (benign prostatic hyperplasia)  Chronic back pain  Elevated PSA (6.1 05/2011), (6.5 07/2011) (7.6 10/13) (7.5 5/14)  Murmur - 2D Echocardiogram without contrast; Future  Other Orders - Discontinue: traMADol (ULTRAM) 50 MG tablet; Take 1 tablet (50 mg total) by mouth every 8 (eight) hours as needed. For pain - traMADol (ULTRAM) 50 MG tablet; Take 1 tablet (50 mg total) by mouth every 8 (eight) hours as needed. For  pain    BPH: Controlled however I would like to continue to get a PSA twice a year to screen for prostate cancer Hyperlipidemia: Due for repeat repeat LDL for now continue simvastatin, checking liver enzymes Chronic back pain: Controlled continue tramadol and hydrocodone Murmur: Worsening, concerning given his worsening lower extremity edema therefore echocardiogram has been ordered  40 minutes spent face-to-face during visit today of which at least 50% was counseling or coordinating care regarding murmur, elevated PSA, chronic back pain, BPH, hyperlipidemia.   Return in about 3 months (around 02/06/2013).

## 2012-11-07 ENCOUNTER — Telehealth: Payer: Self-pay | Admitting: *Deleted

## 2012-11-07 NOTE — Telephone Encounter (Signed)
2D echo approved. Auth # [id number].  Meyer Cory, LPN

## 2012-11-14 ENCOUNTER — Other Ambulatory Visit (HOSPITAL_COMMUNITY): Payer: Self-pay | Admitting: Family Medicine

## 2012-11-14 ENCOUNTER — Ambulatory Visit (HOSPITAL_BASED_OUTPATIENT_CLINIC_OR_DEPARTMENT_OTHER)
Admission: RE | Admit: 2012-11-14 | Discharge: 2012-11-14 | Disposition: A | Discharge status: Home / Self Care | Payer: Medicare Other | Source: Ambulatory Visit | Attending: Family Medicine | Admitting: Family Medicine

## 2012-11-14 DIAGNOSIS — R011 Cardiac murmur, unspecified: Secondary | ICD-10-CM

## 2012-11-14 DIAGNOSIS — M549 Dorsalgia, unspecified: Secondary | ICD-10-CM | POA: Insufficient documentation

## 2012-11-14 DIAGNOSIS — M19019 Primary osteoarthritis, unspecified shoulder: Secondary | ICD-10-CM | POA: Insufficient documentation

## 2012-11-14 DIAGNOSIS — G8929 Other chronic pain: Secondary | ICD-10-CM | POA: Insufficient documentation

## 2012-11-14 DIAGNOSIS — N189 Chronic kidney disease, unspecified: Secondary | ICD-10-CM | POA: Insufficient documentation

## 2012-11-14 DIAGNOSIS — M1710 Unilateral primary osteoarthritis, unspecified knee: Secondary | ICD-10-CM | POA: Insufficient documentation

## 2012-11-14 DIAGNOSIS — E785 Hyperlipidemia, unspecified: Secondary | ICD-10-CM | POA: Insufficient documentation

## 2012-11-14 DIAGNOSIS — I059 Rheumatic mitral valve disease, unspecified: Secondary | ICD-10-CM

## 2012-11-14 NOTE — Progress Notes (Signed)
  Echocardiogram 2D Echocardiogram has been performed.  Roberto Day 11/14/2012, 10:00 AM

## 2012-11-15 ENCOUNTER — Encounter: Payer: Self-pay | Admitting: Family Medicine

## 2012-11-15 ENCOUNTER — Telehealth: Payer: Self-pay | Admitting: Family Medicine

## 2012-11-15 DIAGNOSIS — I050 Rheumatic mitral stenosis: Secondary | ICD-10-CM

## 2012-11-15 DIAGNOSIS — I351 Nonrheumatic aortic (valve) insufficiency: Secondary | ICD-10-CM

## 2012-11-15 LAB — COMPLETE METABOLIC PANEL WITH GFR
AST: 19 U/L (ref 0–37)
Albumin: 3.6 g/dL (ref 3.5–5.2)
Alkaline Phosphatase: 101 U/L (ref 39–117)
BUN: 16 mg/dL (ref 6–23)
Creat: 1.21 mg/dL (ref 0.50–1.35)
GFR, Est Non African American: 53 mL/min — ABNORMAL LOW
Glucose, Bld: 91 mg/dL (ref 70–99)
Potassium: 4.6 mEq/L (ref 3.5–5.3)
Total Bilirubin: 0.6 mg/dL (ref 0.3–1.2)

## 2012-11-15 LAB — LIPID PANEL
Cholesterol: 130 mg/dL (ref 0–200)
HDL: 57 mg/dL (ref >39)
Total CHOL/HDL Ratio: 2.3 Ratio
VLDL: 16 mg/dL (ref 0–40)

## 2012-11-15 LAB — PSA: PSA: 7.2 ng/mL — ABNORMAL HIGH (ref <=4.00)

## 2012-11-15 NOTE — Telephone Encounter (Signed)
Roberto Day, Will you please let the [facility] family know that Roberto Day' heart ultrasound showed that his murmur is coming from some back flow across his aortic valve.  I'd recommend that he establish with one of our cardiologists to come up with a plan of how frequently this needs to be monitored.  I'd like him to become established before he becomes symptomatic with shortness of breath which could one day develop if the murmur is not addressed.  Referral has been placed.

## 2012-11-15 NOTE — Telephone Encounter (Signed)
Pt.notified

## 2012-11-21 ENCOUNTER — Ambulatory Visit (INDEPENDENT_AMBULATORY_CARE_PROVIDER_SITE_OTHER): Payer: Medicare Other | Admitting: Cardiology

## 2012-11-21 ENCOUNTER — Encounter: Payer: Self-pay | Admitting: Cardiology

## 2012-11-21 VITALS — BP 108/76 | HR 88 | Ht 68.0 in | Wt 157.0 lb

## 2012-11-21 DIAGNOSIS — I351 Nonrheumatic aortic (valve) insufficiency: Secondary | ICD-10-CM

## 2012-11-21 DIAGNOSIS — I050 Rheumatic mitral stenosis: Secondary | ICD-10-CM

## 2012-11-21 DIAGNOSIS — I359 Nonrheumatic aortic valve disorder, unspecified: Secondary | ICD-10-CM

## 2012-11-21 DIAGNOSIS — R911 Solitary pulmonary nodule: Secondary | ICD-10-CM

## 2012-11-21 MED ORDER — FUROSEMIDE 20 MG PO TABS: 20.0000 mg | ORAL_TABLET | ORAL | Status: DC

## 2012-11-21 NOTE — Assessment & Plan Note (Signed)
Patient is noted to have aortic insufficiency, aortic stenosis and mitral stenosis on echocardiogram. Is not complaining of significant dyspnea and is elderly. I would like to treat conservatively if possible. I will have Lasix 20 mg every other day. Check potassium and renal function in 2 weeks. Plan followup echocardiogram in one year.

## 2012-11-21 NOTE — Patient Instructions (Signed)
Your physician wants you to follow-up in: 6 MONTHS WITH DR Jens Som You will receive a reminder letter in the mail two months in advance. If you don't receive a letter, please call our office to schedule the follow-up appointment.   CT OF THE CHEST WITHOUT CONTRAST TO FOLLOW UP LUNG NODULE  START FUROSEMIDE 20 MG ONE TABLET EVERY OTHER DAY  Your physician recommends that you return for lab work in: 2 WEEKS IN [employer]

## 2012-11-21 NOTE — Assessment & Plan Note (Signed)
Patient noted to have a pulmonary nodule on previous abdominal CT. Repeat study as followup recommended in one year.

## 2012-11-21 NOTE — Progress Notes (Signed)
HPI: 79 year old male for evaluation of aortic insufficiency. Patient recently noted to have a murmur. Echocardiogram in November 2014 showed normal LV function. There was mild aortic stenosis and moderate aortic insufficiency. There was mild to moderate mitral stenosis. Patient has dyspnea with more extreme activities but not routine activities. No orthopnea, PND, chest pain or syncope. He does have chronic pedal edema worse recently.  Current Outpatient Prescriptions  Medication Sig Dispense Refill  . atorvastatin (LIPITOR) 10 MG tablet TAKE ONE TABLET BY MOUTH ONCE DAILY  90 tablet  0  . betaxolol (BETOPTIC-S) 0.25 % ophthalmic suspension Place 1 drop into both eyes 2 (two) times daily.  5 mL  3  . Docusate Sodium (COLACE PO) Take by mouth.      . doxazosin (CARDURA) 8 MG tablet Take 1 tablet (8 mg total) by mouth at bedtime.  90 tablet  3  . gabapentin (NEURONTIN) 300 MG capsule Take 1 capsule (300 mg total) by mouth 3 (three) times daily.  90 capsule  6  . HYDROcodone-acetaminophen (NORCO) 7.5-325 MG per tablet Take 1 tablet by mouth every 8 (eight) hours as needed for pain.  90 tablet  0  . Multiple Vitamins-Minerals (CENTRUM SILVER PO) Take 1 tablet by mouth daily.       . Potassium Gluconate 550 MG TABS Take 1 tablet (550 mg total) by mouth daily.  90 each  1  . ranitidine (ZANTAC) 150 MG tablet Take 1 tablet (150 mg total) by mouth 2 (two) times daily.  60 tablet  5  . Saw Palmetto, Serenoa repens, (SAW PALMETTO PO) Take 1 tablet by mouth daily.       . Tamsulosin HCl (FLOMAX) 0.4 MG CAPS Take 1 capsule (0.4 mg total) by mouth daily.  90 capsule  1  . traMADol (ULTRAM) 50 MG tablet Take 1 tablet (50 mg total) by mouth every 8 (eight) hours as needed. For pain  90 tablet  3   No current facility-administered medications for this visit.    Allergies  Allergen Reactions  . Codeine Other (See Comments)    "felt like I was going to pass out"  . Celebrex [Celecoxib] Other (See  Comments)    "caused me to bleed; dr took me off it right away" (12/05/2011)  . Aleve [Naproxen Sodium]     Diarrhea     Past Medical History  Diagnosis Date  . Unintentional weight loss 10/07/2011  . BPH (benign prostatic hyperplasia) 10/07/2011  . Hematuria 10/07/2011  . Bladder stone 10/07/2011  . Aortic stenosis     and aortic insufficiency  . Hyperlipidemia   . Bladder infection     hx of  . GERD (gastroesophageal reflux disease)     only take medication as needed  . Arthritis     "bilateral knees and right shoulder"  . Hypertension     Dr. Laren Boom, [place] [facility]  . Pneumonia     "had it very frequently when I was a child" (12/05/2011)  . Nitrate poisoning 1945  . Stomach ulcer 1945    "caused by nitrate poisoning" (12/05/2011)  . Migraines     "went away inthe early 1940's" (12/05/2011)    Past Surgical History  Procedure Laterality Date  . Circumcision      1996  . Inner ear surgery  1948    "left; perforated eardrum now" (12/05/2011)  . Inguinal hernia repair  12/05/2011    bilaterally  . Tonsillectomy  1946  .  Inguinal hernia repair  12/05/2011    Procedure: HERNIA REPAIR INGUINAL ADULT BILATERAL;  Surgeon: [name]. [name], MD;  Location: [facility] OR;  Service: General;  Laterality: Bilateral;  . Insertion of mesh  12/05/2011    Procedure: INSERTION OF MESH;  Surgeon: [name]. [name], MD;  Location: [facility] OR;  Service: General;  Laterality: Bilateral;    History   Social History  . Marital Status: Married    Spouse Name: N/A    Number of Children: 3  . Years of Education: N/A   Occupational History  . Not on file.   Social History Main Topics  . Smoking status: Former Smoker -- 5 years    Types: Cigarettes  . Smokeless tobacco: Never Used     Comment: 12/05/2011 "haven't smoked since age [age]"  . Alcohol Use: Yes     Comment: 12/05/2011 "very rarely have a beer"  . Drug Use: No  . Sexual Activity: No   Other Topics Concern  . Not on file    Social History Narrative  . No narrative on file    Family History  Problem Relation Age of Onset  . Heart attack Mother   . Stroke Mother     ROS: knee arthralgias but no fevers or chills, productive cough, hemoptysis, dysphasia, odynophagia, melena, hematochezia, dysuria, hematuria, rash, seizure activity, orthopnea, PND, claudication. Remaining systems are negative.  Physical Exam:   Blood pressure 108/76, pulse 88, height 5\' 8"  (1.727 m), weight 157 lb (71.215 kg), SpO2 95.00%.  General:  Well developed/well nourished in NAD Skin warm/dry Patient not depressed No peripheral clubbing Back-normal HEENT-normal/normal eyelids Neck supple/normal carotid upstroke bilaterally; no bruits; no JVD; no thyromegaly chest - CTA/ normal expansion CV - RRR/normal S1 and S2; no rubs or gallops;  PMI nondisplaced, 2/6 systolic murmur left sternal border. No diastolic murmur. Abdomen -NT/ND, no HSM, no mass, + bowel sounds, no bruit 2+ femoral pulses, no bruits Ext-1+ edema, no chords, 2+ DP Neuro-grossly nonfocal  ECG Sinus rhythm with short PR interval. Left ventricular hypertrophy. Inferior T-wave inversion.

## 2012-11-22 ENCOUNTER — Ambulatory Visit (INDEPENDENT_AMBULATORY_CARE_PROVIDER_SITE_OTHER): Payer: Medicare Other

## 2012-11-22 DIAGNOSIS — I080 Rheumatic disorders of both mitral and aortic valves: Secondary | ICD-10-CM

## 2012-11-22 DIAGNOSIS — R911 Solitary pulmonary nodule: Secondary | ICD-10-CM

## 2012-11-22 DIAGNOSIS — R918 Other nonspecific abnormal finding of lung field: Secondary | ICD-10-CM

## 2012-11-22 DIAGNOSIS — I2510 Atherosclerotic heart disease of native coronary artery without angina pectoris: Secondary | ICD-10-CM

## 2012-11-27 ENCOUNTER — Other Ambulatory Visit: Payer: Self-pay | Admitting: *Deleted

## 2012-11-27 ENCOUNTER — Telehealth: Payer: Self-pay | Admitting: Family Medicine

## 2012-11-27 DIAGNOSIS — R9389 Abnormal findings on diagnostic imaging of other specified body structures: Secondary | ICD-10-CM

## 2012-11-27 DIAGNOSIS — J189 Pneumonia, unspecified organism: Secondary | ICD-10-CM

## 2012-11-27 MED ORDER — AZITHROMYCIN 250 MG PO TABS: ORAL_TABLET | ORAL | Status: AC

## 2012-11-27 NOTE — Telephone Encounter (Signed)
Pulm being arranged through cardiology, daughter in law given azithro rx for Mr. Zentz given atypical infection findings on CT scan.

## 2012-12-02 ENCOUNTER — Other Ambulatory Visit: Payer: Self-pay | Admitting: Family Medicine

## 2012-12-03 ENCOUNTER — Other Ambulatory Visit: Payer: Self-pay | Admitting: *Deleted

## 2012-12-03 ENCOUNTER — Telehealth: Payer: Self-pay | Admitting: *Deleted

## 2012-12-03 DIAGNOSIS — M19011 Primary osteoarthritis, right shoulder: Secondary | ICD-10-CM

## 2012-12-03 MED ORDER — HYDROCODONE-ACETAMINOPHEN 7.5-325 MG PO TABS: 1.0000 | ORAL_TABLET | Freq: Three times a day (TID) | ORAL | Status: DC | PRN

## 2012-12-03 MED ORDER — HYDROCODONE-ACETAMINOPHEN 7.5-325 MG PO TABS: 1.0000 | ORAL_TABLET | Freq: Four times a day (QID) | ORAL | Status: DC | PRN

## 2012-12-03 NOTE — Telephone Encounter (Signed)
Yeah that's perfectly fine, I've printed a new Rx that he can fill at his leisure since the earlier rx today only reflected TID dosing.

## 2012-12-03 NOTE — Telephone Encounter (Signed)
Pt's daughter in law noticed pt's tremors seemed to increase after adding additional dose of Tramadol a day for a total of (3 ) %0mg  tablets a day. Daughter suggested he drop back down to two tablets a day. The patient wanted to ask if the hydrocodone could be increase to QID.Please advise

## 2012-12-03 NOTE — Telephone Encounter (Signed)
Pt's daughter in law notified. She didn't take the previous rx to be filled

## 2012-12-06 LAB — BASIC METABOLIC PANEL WITH GFR
BUN: 20 mg/dL (ref 6–23)
CO2: 30 mEq/L (ref 19–32)
Calcium: 9.2 mg/dL (ref 8.4–10.5)
Creat: 1.31 mg/dL (ref 0.50–1.35)
GFR, Est African American: 55 mL/min — ABNORMAL LOW
Sodium: 137 mEq/L (ref 135–145)

## 2012-12-10 ENCOUNTER — Encounter: Payer: Self-pay | Admitting: Pulmonary Disease

## 2012-12-10 ENCOUNTER — Ambulatory Visit (INDEPENDENT_AMBULATORY_CARE_PROVIDER_SITE_OTHER): Payer: Medicare HMO | Admitting: Pulmonary Disease

## 2012-12-10 VITALS — BP 112/70 | HR 76 | Temp 97.9°F | Ht 67.0 in | Wt 155.0 lb

## 2012-12-10 DIAGNOSIS — J8410 Pulmonary fibrosis, unspecified: Secondary | ICD-10-CM

## 2012-12-10 DIAGNOSIS — R911 Solitary pulmonary nodule: Secondary | ICD-10-CM

## 2012-12-10 NOTE — Assessment & Plan Note (Signed)
Follow up CT scan in 6 months - doubt he is a candidate for diagnostic procedures ? Scar vs malignancy

## 2012-12-10 NOTE — Assessment & Plan Note (Signed)
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 Doubt he will be able to perform PFT maneuver.

## 2012-12-10 NOTE — Progress Notes (Signed)
Subjective:    Patient ID: Roberto Day., male    DOB: December 23, 1921, 79 y.o.   MRN: [id number]  HPI  79 y.o remote smoker (quit in 20's) referred for evaluation of RML pulmonary nodule & abnormal CT CT abdomen for renal stone in 10/2011 had shown ILD & RLL pleural based 3 mm nodule Repeat Ct chest 11/2012 showed underlying interstitial lung Disease. There was a a 1.1 x 0.7 x 2.0 cm elongated nodular opacity in the posterior aspect of the right middle lobe abutting the major fissure. There were ill-defined nodular areas of ground-glass attenuation bilaterally in a predominantly upper lung and peribronchovascular distribution, suggestive of an active atypical infectious process.  He is wheelchair bound due to bad knees. 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 There was mild aortic stenosis and moderate aortic insufficiency. There was mild to moderate mitral stenosis, nml RVSF Dyspnea is difficult to estimate due to his limited mobility, he reports class 2    Past Medical History  Diagnosis Date  . Unintentional weight loss 10/07/2011  . BPH (benign prostatic hyperplasia) 10/07/2011  . Hematuria 10/07/2011  . Bladder stone 10/07/2011  . Aortic stenosis     and aortic insufficiency  . Hyperlipidemia   . Bladder infection     hx of  . GERD (gastroesophageal reflux disease)     only take medication as needed  . Arthritis     "bilateral knees and right shoulder"  . Hypertension     Dr. Laren Boom, [place] [facility]  . Pneumonia     "had it very frequently when I was a child" (12/05/2011)  . Nitrate poisoning 1945  . Stomach ulcer 1945    "caused by nitrate poisoning" (12/05/2011)  . Migraines     "went away inthe early 1940's" (12/05/2011)   Past Surgical History  Procedure Laterality Date  . Circumcision      1996  . Inner ear  surgery  1948    "left; perforated eardrum now" (12/05/2011)  . Inguinal hernia repair  12/05/2011    bilaterally  . Tonsillectomy  1946  . Inguinal hernia repair  12/05/2011    Procedure: HERNIA REPAIR INGUINAL ADULT BILATERAL;  Surgeon: [name]. [name], MD;  Location: [facility];  Service: General;  Laterality: Bilateral;  . Insertion of mesh  12/05/2011    Procedure: INSERTION OF MESH;  Surgeon: [name]. [name], MD;  Location: [facility] OR;  Service: General;  Laterality: Bilateral;   Allergies  Allergen Reactions  . Codeine Other (See Comments)    "felt like I was going to pass out"  . Celebrex [Celecoxib] Other (See Comments)    "caused me to bleed; dr took me off it right away" (12/05/2011)  . Aleve [Naproxen Sodium]     Diarrhea     History   Social History  . Marital Status: Married    Spouse Name: N/A    Number of Children: 3  . Years of Education: N/A   Occupational History  . Not on file.   Social History Main Topics  . Smoking status: Former Smoker -- 0.50 packs/day for 5 years    Types: Cigarettes  . Smokeless tobacco: Never Used     Comment: 12/05/2011 "haven't smoked since age [age]"  . Alcohol Use: Yes     Comment: 12/05/2011 "very rarely have a beer"  .  Drug Use: No  . Sexual Activity: No   Other Topics Concern  . Not on file   Social History Narrative  . No narrative on file    Family History  Problem Relation Age of Onset  . Heart attack Mother   . Stroke Mother      Review of Systems  Constitutional: Negative for fever and unexpected weight change.  HENT: Negative for congestion, dental problem, ear pain, nosebleeds, postnasal drip, rhinorrhea, sinus pressure, sneezing, sore throat and trouble swallowing.   Eyes: Negative for redness and itching.  Respiratory: Positive for shortness of breath. Negative for cough, chest tightness and wheezing.   Cardiovascular: Negative for palpitations and leg swelling.  Gastrointestinal: Negative for nausea and  vomiting.  Genitourinary: Negative for dysuria.  Musculoskeletal: Negative for joint swelling.  Skin: Negative for rash.  Neurological: Negative for headaches.  Hematological: Does not bruise/bleed easily.  Psychiatric/Behavioral: Negative for dysphoric mood. The patient is not nervous/anxious.        Objective:   Physical Exam  Gen. Pleasant, elderly,well-nourished, in no distress, normal affect, in wheelchair ENT - no lesions, no post nasal drip Neck: No JVD, no thyromegaly, no carotid bruits Lungs: no use of accessory muscles, no dullness to percussion, bibasal 1/3 coarse dry rales, no  rhonchi  Cardiovascular: Rhythm regular, heart sounds  normal, no murmurs or gallops, no peripheral edema Abdomen: soft and non-tender, no hepatosplenomegaly, BS normal. Musculoskeletal: No deformities, no cyanosis or clubbing Neuro:  alert, non focal        Assessment & Plan:

## 2012-12-10 NOTE — Patient Instructions (Addendum)
You have pulmonary fibrosis at the bottom of your lungs You have a 11 mm nodule in your right lung  Follow up CT scan in 6 months

## 2012-12-11 ENCOUNTER — Other Ambulatory Visit: Payer: Self-pay | Admitting: Family Medicine

## 2012-12-18 ENCOUNTER — Other Ambulatory Visit: Payer: Self-pay | Admitting: Family Medicine

## 2012-12-21 ENCOUNTER — Telehealth: Payer: Self-pay | Admitting: *Deleted

## 2012-12-21 NOTE — Telephone Encounter (Signed)
Pt's daughter in law called and states the cardura is now $59 which is quite more than what they have paid in the past. (they recently had to change their insurance to [facility]). Wanted to know if there was anything cheaper that is equivalent? No hurry because they just picked up the rx and they have a 3 months supply

## 2012-12-24 ENCOUNTER — Telehealth: Payer: Self-pay | Admitting: *Deleted

## 2012-12-24 DIAGNOSIS — M19011 Primary osteoarthritis, right shoulder: Secondary | ICD-10-CM

## 2012-12-24 MED ORDER — HYDROCODONE-ACETAMINOPHEN 7.5-325 MG PO TABS: 1.0000 | ORAL_TABLET | Freq: Four times a day (QID) | ORAL | Status: DC | PRN

## 2012-12-24 NOTE — Telephone Encounter (Signed)
Pt is requesting a refill on hydrocodone. It was just filled 12/1 to take every 6 hours and #120 was disp. Is it ok for an early refill?

## 2012-12-28 NOTE — Telephone Encounter (Signed)
Pt's daughter in law notified.

## 2012-12-28 NOTE — Telephone Encounter (Signed)
Two options in the same class of medication -Terazosin -Prazosin  I don't have access to every individual plan but it would be wise for Roberto Day or his daughter in law to call [employer] and see if one is preferred over the other with respect to price.  Just let me know and I'd be happy to change it.

## 2013-01-08 ENCOUNTER — Other Ambulatory Visit: Payer: Self-pay | Admitting: Family Medicine

## 2013-01-10 ENCOUNTER — Other Ambulatory Visit: Payer: Self-pay | Admitting: Family Medicine

## 2013-01-28 ENCOUNTER — Other Ambulatory Visit: Payer: Self-pay | Admitting: *Deleted

## 2013-01-28 DIAGNOSIS — M19011 Primary osteoarthritis, right shoulder: Secondary | ICD-10-CM

## 2013-01-28 MED ORDER — HYDROCODONE-ACETAMINOPHEN 7.5-325 MG PO TABS: 1.0000 | ORAL_TABLET | Freq: Four times a day (QID) | ORAL | Status: DC | PRN

## 2013-02-07 ENCOUNTER — Ambulatory Visit (INDEPENDENT_AMBULATORY_CARE_PROVIDER_SITE_OTHER): Payer: Medicare HMO | Admitting: Family Medicine

## 2013-02-07 ENCOUNTER — Encounter: Payer: Self-pay | Admitting: Family Medicine

## 2013-02-07 VITALS — BP 121/70 | HR 60 | Wt 157.0 lb

## 2013-02-07 DIAGNOSIS — M170 Bilateral primary osteoarthritis of knee: Secondary | ICD-10-CM

## 2013-02-07 DIAGNOSIS — IMO0002 Reserved for concepts with insufficient information to code with codable children: Secondary | ICD-10-CM

## 2013-02-07 DIAGNOSIS — G8929 Other chronic pain: Secondary | ICD-10-CM

## 2013-02-07 DIAGNOSIS — I351 Nonrheumatic aortic (valve) insufficiency: Secondary | ICD-10-CM

## 2013-02-07 DIAGNOSIS — M549 Dorsalgia, unspecified: Secondary | ICD-10-CM

## 2013-02-07 DIAGNOSIS — M1710 Unilateral primary osteoarthritis, unspecified knee: Secondary | ICD-10-CM

## 2013-02-07 DIAGNOSIS — N400 Enlarged prostate without lower urinary tract symptoms: Secondary | ICD-10-CM

## 2013-02-07 DIAGNOSIS — I359 Nonrheumatic aortic valve disorder, unspecified: Secondary | ICD-10-CM

## 2013-02-07 MED ORDER — TERAZOSIN HCL 5 MG PO CAPS: 5.0000 mg | ORAL_CAPSULE | Freq: Every day | ORAL | Status: DC

## 2013-02-07 MED ORDER — GABAPENTIN 300 MG PO CAPS: ORAL_CAPSULE | ORAL | Status: DC

## 2013-02-07 NOTE — Progress Notes (Signed)
CC: Roberto SchlatterRobert J Croft Sr. is a 80 y.o. male is here for 3 month f/u   Subjective: HPI:  Followup BPH: Cardura is going to cost $40 a month which patient finds is unreasonable.  He was given a list of alternatives to price compare however has not done so yet. He states that he is satisfactory with his urinary habits while on Cardura and Flomax. Awakening no more than once at night to urinate. Denies having to strain.  Followup aortic regurgitation: Patient states that he'll feel short of breath if he tries to climb a flight of stairs however denies any chest pain. He denies shortness of breath with casual walking or when socializing with others, states that shortness of breath is not at all interfere with his current quality of life.  Followup osteoarthritis both knees: He continues to take hydrocodone and has avoided taking nonsteroidal anti-inflammatories due to side effects. He states that his knees cause pain of moderate severity deep within the knee is nonradiating acute symptom doing things that he would like to do out in the community. He is establish with an orthopedist in a [facility] system and is asking me today she should have his knees replaced.  Followup chronic back pain: Patient states that back pain is nonexistent when taking gabapentin 300 mg 4 times a day. States that 300 mg 3 times a day which caused breakthrough pain low in the midline of his back nonradiating. He denies fatigue, swelling, nor any known side effects of taking gabapentin   Review Of Systems Outlined In HPI  Past Medical History  Diagnosis Date  . Unintentional weight loss 10/07/2011  . BPH (benign prostatic hyperplasia) 10/07/2011  . Hematuria 10/07/2011  . Bladder stone 10/07/2011  . Aortic stenosis     and aortic insufficiency  . Hyperlipidemia   . Bladder infection     hx of  . GERD (gastroesophageal reflux disease)     only take medication as needed  . Arthritis     "bilateral knees and right shoulder"  .  Hypertension     Dr. Laren BoomSean Porchea Charrier, [place] [facility]  . Pneumonia     "had it very frequently when I was a child" (12/05/2011)  . Nitrate poisoning 1945  . Stomach ulcer 1945    "caused by nitrate poisoning" (12/05/2011)  . Migraines     "went away inthe early 1940's" (12/05/2011)    Past Surgical History  Procedure Laterality Date  . Circumcision      1996  . Inner ear surgery  1948    "left; perforated eardrum now" (12/05/2011)  . Inguinal hernia repair  12/05/2011    bilaterally  . Tonsillectomy  1946  . Inguinal hernia repair  12/05/2011    Procedure: HERNIA REPAIR INGUINAL ADULT BILATERAL;  Surgeon: Wilmon ArmsMatthew K. Corliss Skainssuei, MD;  Location: [facility] OR;  Service: General;  Laterality: Bilateral;  . Insertion of mesh  12/05/2011    Procedure: INSERTION OF MESH;  Surgeon: Wilmon ArmsMatthew K. Corliss Skainssuei, MD;  Location: [facility] OR;  Service: General;  Laterality: Bilateral;   Family History  Problem Relation Age of Onset  . Heart attack Mother   . Stroke Mother     History   Social History  . Marital Status: Married    Spouse Name: N/A    Number of Children: 3  . Years of Education: N/A   Occupational History  . Not on file.   Social History Main Topics  . Smoking status: Former Smoker -- 0.50  packs/day for 5 years    Types: Cigarettes  . Smokeless tobacco: Never Used     Comment: 12/05/2011 "haven't smoked since age [age]"  . Alcohol Use: Yes     Comment: 12/05/2011 "very rarely have a beer"  . Drug Use: No  . Sexual Activity: No   Other Topics Concern  . Not on file   Social History Narrative  . No narrative on file     Objective: BP 121/70  Pulse 60  Wt 157 lb (71.215 kg)  General: Alert and Oriented, No Acute Distress HEENT: Pupils equal, round, reactive to light. Conjunctivae clear.  Moist mucous membranes pharynx unremarkable Lungs: Clear to auscultation bilaterally, no wheezing/ronchi/rales.  Comfortable work of breathing. Good air movement. Cardiac: Regular rate and rhythm.  Normal S1/S2.  No no rubs or gallop however murmur grade 4/6 heard in the left second intercostal space mostly Extremities: No peripheral edema.  Strong peripheral pulses.  Mental Status: No depression, anxiety, nor agitation. Skin: Warm and dry.  Assessment & Plan: Roberto Day was seen today for 3 month f/u.  Diagnoses and associated orders for this visit:  BPH (benign prostatic hyperplasia) - terazosin (HYTRIN) 5 MG capsule; Take 1 capsule (5 mg total) by mouth at bedtime. To replace cardura (doxazosin)  Chronic back pain - gabapentin (NEURONTIN) 300 MG capsule; TAKE ONE CAPSULE BY MOUTH four TIMES DAILY  Aortic regurgitation  Osteoarthritis of both knees    BPH: Controlled however he would like to look into alternatives to replace doxazosin, Hydrin prescription was provided as an alternative however do not take both, choose one based on price Chronic back pain: Controlled continue gabapentin Aortic regurgitation: Stable, patient and Dr. Jens Som are awaiting shortness of breath a severity that interferes with quality of life prior to intervention Osteoarthritis of both knees: Continue hydrocodone, discussed with patient that it would be a personal decision between him and his orthopedist based on the risks and benefits of knee replacement however does not sound like he is deteriorating  40 minutes spent face-to-face during visit today of which at least 50% was counseling or coordinating care regarding: 1. BPH (benign prostatic hyperplasia)   2. Chronic back pain   3. Aortic regurgitation   4. Osteoarthritis of both knees        Return in about 3 months (around 05/07/2013).

## 2013-02-11 ENCOUNTER — Other Ambulatory Visit: Payer: Self-pay | Admitting: Family Medicine

## 2013-02-12 ENCOUNTER — Telehealth: Payer: Self-pay | Admitting: *Deleted

## 2013-02-12 NOTE — Telephone Encounter (Signed)
Daughter called and states insurance wants Dr. Ivan AnchorsHommel to calll [phone] to verify that pt is taking both flomax and cardura. [name](daughter n law went to go get one flomax filled and this is what the pharmacist told her) did try to call this number earlier but had to hang up to get a pt. Will try tomorrow

## 2013-02-13 ENCOUNTER — Telehealth: Payer: Self-pay | Admitting: *Deleted

## 2013-02-13 NOTE — Telephone Encounter (Signed)
Patient's daughter-n-law called in regards to flomax and cardura and stated that the pt's insurance stated that our office has not called to explain why the pt needs two medicines etc to have medicine approved. Pt left vm on andera's phone this afternoon adv pt's daughter-n-law their seeing patients and will send a phone note. Daughter-n-law stated the phone number [phone] is a direct number that [facility] pharmacy has given her to the contact person with the insurance  Company. Please advise daughter-n-law-. Thanks

## 2013-02-13 NOTE — Telephone Encounter (Signed)
Called pt and advised him per Dr. Ivan AnchorsHommel via us report that was faxed, pt does not have a blood clot. Pt voiced understanding. Pt did call around 330 today and said he had trouble breathing. Pt was advised by [place] to go to [facility] to be evaluated. Pt did mention to me that he couldn't breath and that he was told to go to UC but all they would do is tell him he needed oxygen and he has oxygen at home. [facility]

## 2013-02-14 NOTE — Telephone Encounter (Signed)
Did call this number yesterday and it said this med was covered for the calender year. Will try again tomorrow

## 2013-02-15 ENCOUNTER — Telehealth: Payer: Self-pay | Admitting: *Deleted

## 2013-02-15 NOTE — Telephone Encounter (Signed)
See phone note from 02/13/2013 @ 4:59. Opened in error and note written in wrong chart. Note from that date has been copied and pasted in correct chart

## 2013-02-15 NOTE — Telephone Encounter (Signed)
Called [facility]. The pharm tech there again said this requires prior auth; she even tried to change the quantity to #30 instead of #90 and it still said it needed prior [place]. He does have [employer] so I don t know if this is something involving  [employer] and I am not going through the proper channels. Misty, can you please look into this? I have called and left a message on pt's vm about what is going on

## 2013-02-15 NOTE — Telephone Encounter (Signed)
I called this number again just now and again through the automated line it said that drug was covered for the current calender year with the reccommended quantity and looking at his hx he has always gotten #90.Tried to call [facility] but no one would answer and kept getting rerouted.will try to call [facility] again later

## 2013-02-17 ENCOUNTER — Other Ambulatory Visit: Payer: Self-pay | Admitting: Family Medicine

## 2013-02-18 NOTE — Telephone Encounter (Signed)
This doesn't have to go through [place]. Was the pharmacy going to fax us the paper for the PA? If not, then I can call them.

## 2013-02-18 NOTE — Telephone Encounter (Signed)
Called [employer] and they state Flomax and Cardura need PAs. [facility]  Meyer CoryMisty Tekelia Kareem, LPN

## 2013-02-18 NOTE — Telephone Encounter (Signed)
Ok the daughter in law left me message that the pharm has faxed papers but I havent seen anything.Also she said that it requires PA because he is on this medication and cardura which to me still doesn't make sense to me because it still says approved when I called. She said there is another number [phone] to speak with someone and then she said there was an authorization number [id number] (which doesn't make sense because if its authorized why do we have to call)

## 2013-02-20 ENCOUNTER — Telehealth: Payer: Self-pay | Admitting: Family Medicine

## 2013-02-20 DIAGNOSIS — N400 Enlarged prostate without lower urinary tract symptoms: Secondary | ICD-10-CM

## 2013-02-20 MED ORDER — TERAZOSIN HCL 5 MG PO CAPS: 5.0000 mg | ORAL_CAPSULE | Freq: Every day | ORAL | Status: DC

## 2013-02-20 NOTE — Telephone Encounter (Signed)
Sue LushAndrea, Will you please let Mr. Roberto MunroeJonas know that [name] has denied our request for coverage of cardura/doxazosin for his BPH, instead they tell me they will cover terazosin which I printed out for him on the 5th, let me know if he needs a new Rx sent in.

## 2013-02-20 NOTE — Telephone Encounter (Signed)
Roberto MortimerWanda pt's daughter in law was notified. They do not have a rx for terazosin so I will resend it

## 2013-02-25 ENCOUNTER — Other Ambulatory Visit: Payer: Self-pay | Admitting: *Deleted

## 2013-02-25 DIAGNOSIS — M19011 Primary osteoarthritis, right shoulder: Secondary | ICD-10-CM

## 2013-02-25 MED ORDER — HYDROCODONE-ACETAMINOPHEN 7.5-325 MG PO TABS: 1.0000 | ORAL_TABLET | Freq: Four times a day (QID) | ORAL | Status: DC | PRN

## 2013-03-19 ENCOUNTER — Other Ambulatory Visit: Payer: Self-pay | Admitting: Family Medicine

## 2013-03-20 ENCOUNTER — Telehealth: Payer: Self-pay | Admitting: *Deleted

## 2013-03-20 NOTE — Telephone Encounter (Signed)
PA approved for Terazosin.  Auth good until 03/21/15.

## 2013-03-25 ENCOUNTER — Other Ambulatory Visit: Payer: Self-pay | Admitting: Family Medicine

## 2013-03-27 ENCOUNTER — Other Ambulatory Visit: Payer: Self-pay | Admitting: *Deleted

## 2013-03-27 ENCOUNTER — Ambulatory Visit (HOSPITAL_BASED_OUTPATIENT_CLINIC_OR_DEPARTMENT_OTHER): Payer: Medicare HMO

## 2013-03-27 DIAGNOSIS — M19011 Primary osteoarthritis, right shoulder: Secondary | ICD-10-CM

## 2013-03-27 MED ORDER — HYDROCODONE-ACETAMINOPHEN 7.5-325 MG PO TABS: 1.0000 | ORAL_TABLET | Freq: Four times a day (QID) | ORAL | Status: DC | PRN

## 2013-03-28 ENCOUNTER — Ambulatory Visit (HOSPITAL_BASED_OUTPATIENT_CLINIC_OR_DEPARTMENT_OTHER)
Admission: RE | Admit: 2013-03-28 | Discharge: 2013-03-28 | Disposition: A | Discharge status: Home / Self Care | Payer: Medicare HMO | Source: Ambulatory Visit | Attending: Pulmonary Disease | Admitting: Pulmonary Disease

## 2013-03-28 DIAGNOSIS — I359 Nonrheumatic aortic valve disorder, unspecified: Secondary | ICD-10-CM | POA: Insufficient documentation

## 2013-03-28 DIAGNOSIS — I2510 Atherosclerotic heart disease of native coronary artery without angina pectoris: Secondary | ICD-10-CM | POA: Insufficient documentation

## 2013-03-28 DIAGNOSIS — R911 Solitary pulmonary nodule: Secondary | ICD-10-CM | POA: Insufficient documentation

## 2013-03-29 ENCOUNTER — Telehealth: Payer: Self-pay | Admitting: *Deleted

## 2013-03-29 NOTE — Telephone Encounter (Signed)
Pt's daughter in law wanted to know if it is ok to take the cardura and the flomax both at night. Advised that this should be fine

## 2013-04-25 ENCOUNTER — Other Ambulatory Visit: Payer: Self-pay | Admitting: Family Medicine

## 2013-04-26 ENCOUNTER — Other Ambulatory Visit: Payer: Self-pay | Admitting: Family Medicine

## 2013-04-29 ENCOUNTER — Other Ambulatory Visit: Payer: Self-pay | Admitting: *Deleted

## 2013-04-29 DIAGNOSIS — M19011 Primary osteoarthritis, right shoulder: Secondary | ICD-10-CM

## 2013-04-29 MED ORDER — HYDROCODONE-ACETAMINOPHEN 7.5-325 MG PO TABS: 1.0000 | ORAL_TABLET | Freq: Four times a day (QID) | ORAL | Status: DC | PRN

## 2013-04-29 NOTE — Telephone Encounter (Signed)
Roberto Day, Rx placed in in-box ready for pickup/faxing.  

## 2013-05-07 ENCOUNTER — Encounter: Payer: Self-pay | Admitting: Family Medicine

## 2013-05-07 ENCOUNTER — Ambulatory Visit (INDEPENDENT_AMBULATORY_CARE_PROVIDER_SITE_OTHER): Payer: Medicare HMO | Admitting: Family Medicine

## 2013-05-07 VITALS — BP 100/54 | HR 76 | Wt 153.0 lb

## 2013-05-07 DIAGNOSIS — I359 Nonrheumatic aortic valve disorder, unspecified: Secondary | ICD-10-CM

## 2013-05-07 DIAGNOSIS — N400 Enlarged prostate without lower urinary tract symptoms: Secondary | ICD-10-CM

## 2013-05-07 DIAGNOSIS — I351 Nonrheumatic aortic (valve) insufficiency: Secondary | ICD-10-CM

## 2013-05-07 DIAGNOSIS — R259 Unspecified abnormal involuntary movements: Secondary | ICD-10-CM

## 2013-05-07 DIAGNOSIS — R42 Dizziness and giddiness: Secondary | ICD-10-CM

## 2013-05-07 DIAGNOSIS — R251 Tremor, unspecified: Secondary | ICD-10-CM

## 2013-05-07 MED ORDER — TERAZOSIN HCL 5 MG PO CAPS: 5.0000 mg | ORAL_CAPSULE | ORAL | Status: DC

## 2013-05-07 NOTE — Progress Notes (Signed)
CC: Roberto SchlatterRobert J Broda Sr. is a 80 y.o. male is here for 3 month f/u   Subjective: HPI:  Patient complains of lightheadedness that has been present ever since he switched from doxazosin to terazosin. He's also noticed that his blood pressures are running approximately 20 points systolic and 10 points diastolic lower since this switch. Lightheadedness occurs with any ascending vertical change such as from sitting to standing. Symptoms last a matter of seconds and gradually improve over 1 minute. Symptoms never occur at rest or when descending vertical positions.  Has not been accompanied by shortness of breath, orthopnea, peripheral edema nor any other motor or sensory disturbances. Symptoms occur multiple times during the day there have been no falls since I saw him last. Denies chest pain, irregular heartbeat  Patient complains of a tremor that has been present for matter of months localized in both hands and had been present in the feet however resolved after starting gabapentin. Tremor is absent at rest and present only with purposeful movements.  Not interfering with quality of life currently. Denies rigidity nor family history of Parkinson's disease. Nothing particularly makes symptoms better or worse there are mild in severity  Followup BPH: He continues on Flomax and terazosin without any difficulty voiding, weak stream, hesitancy, frequency, nor awakening at night to urinate.     Review Of Systems Outlined In HPI  Past Medical History  Diagnosis Date  . Unintentional weight loss 10/07/2011  . BPH (benign prostatic hyperplasia) 10/07/2011  . Hematuria 10/07/2011  . Bladder stone 10/07/2011  . Aortic stenosis     and aortic insufficiency  . Hyperlipidemia   . Bladder infection     hx of  . GERD (gastroesophageal reflux disease)     only take medication as needed  . Arthritis     "bilateral knees and right shoulder"  . Hypertension     Dr. Laren BoomSean Brittnie Lewey, [facility]  .  Pneumonia     "had it very frequently when I was a child" (12/05/2011)  . Nitrate poisoning 1945  . Stomach ulcer 1945    "caused by nitrate poisoning" (12/05/2011)  . Migraines     "went away inthe early 1940's" (12/05/2011)    Past Surgical History  Procedure Laterality Date  . Circumcision      1996  . Inner ear surgery  1948    "left; perforated eardrum now" (12/05/2011)  . Inguinal hernia repair  12/05/2011    bilaterally  . Tonsillectomy  1946  . Inguinal hernia repair  12/05/2011    Procedure: HERNIA REPAIR INGUINAL ADULT BILATERAL;  Surgeon: Wilmon ArmsMatthew K. Corliss Skainssuei, MD;  Location: [facility];  Service: General;  Laterality: Bilateral;  . Insertion of mesh  12/05/2011    Procedure: INSERTION OF MESH;  Surgeon: Wilmon ArmsMatthew K. Corliss Skainssuei, MD;  Location: [facility] OR;  Service: General;  Laterality: Bilateral;   Family History  Problem Relation Age of Onset  . Heart attack Mother   . Stroke Mother     History   Social History  . Marital Status: Married    Spouse Name: N/A    Number of Children: 3  . Years of Education: N/A   Occupational History  . Not on file.   Social History Main Topics  . Smoking status: Former Smoker -- 0.50 packs/day for 5 years    Types: Cigarettes  . Smokeless tobacco: Never Used     Comment: 12/05/2011 "haven't smoked since age [age]"  . Alcohol Use: Yes  Comment: 12/05/2011 "very rarely have a beer"  . Drug Use: No  . Sexual Activity: No   Other Topics Concern  . Not on file   Social History Narrative  . No narrative on file     Objective: BP 100/54  Pulse 76  Wt 153 lb (69.4 kg)  General: Alert and Oriented, No Acute Distress HEENT: Pupils equal, round, reactive to light. Conjunctivae clear.   moist membranes pharynx unremarkable  Neuro: Cranial nerves II through XII grossly intact, there is no cogwheeling or rigidity in the upper extremities nor in the wrists. No tremor appreciated during our encounter  Lungs: Clear to auscultation bilaterally, no  wheezing/ronchi/rales.  Comfortable work of breathing. Good air movement. Cardiac: Regular rate and rhythm. Normal S1/S2.   grade 2/6 holosystolic murmur over left second her intercostal space, rubs, nor gallops.   Extremities: No peripheral edema.  Strong peripheral pulses.  Mental Status: No depression, anxiety, nor agitation. Skin: Warm and dry.  Assessment & Plan: Roberto MaduroRobert was seen today for 3 month f/u.  Diagnoses and associated orders for this visit:  BPH (benign prostatic hyperplasia) - terazosin (HYTRIN) 5 MG capsule; Take 1 capsule (5 mg total) by mouth every other day. To replace cardura (doxazosin)  Aortic regurgitation  Tremor  Light headed    BPH: Controlled however I believe he is getting lightheaded due to orthostatic hypotension therefore spacing out terazosin to every other day. I've asked him to check blood pressures twice a day for the next week and to provide me with a diary of this and was the days that he is actually taking the medication along with daily Flomax. If symptoms persist will decrease to 2 mg daily. Aortic regurgitation: Surprisingly I can't appreciate any diastolic murmur today, systolic murmur appears to be similar to my exams in the past. Hopeful that lightheadedness is not due to worsening structural heart disease, reassuring her shortness of breath is not worsening. Tremor: Discussed that medications would likely cause worsening lightheadedness or intolerable side effects, holding off on treatment low suspicion of Parkinson's at this time  Return in about 3 months (around 08/07/2013).

## 2013-05-08 ENCOUNTER — Encounter: Payer: Self-pay | Admitting: Pulmonary Disease

## 2013-05-08 ENCOUNTER — Ambulatory Visit (INDEPENDENT_AMBULATORY_CARE_PROVIDER_SITE_OTHER): Payer: Commercial Managed Care - HMO | Admitting: Pulmonary Disease

## 2013-05-08 VITALS — BP 124/72 | HR 63 | Wt 154.4 lb

## 2013-05-08 DIAGNOSIS — R911 Solitary pulmonary nodule: Secondary | ICD-10-CM | POA: Insufficient documentation

## 2013-05-08 DIAGNOSIS — J8410 Pulmonary fibrosis, unspecified: Secondary | ICD-10-CM

## 2013-05-08 NOTE — Patient Instructions (Addendum)
You have nodule in right lung Repeat CT scan in 6 months Scarring in lungs is stable

## 2013-05-08 NOTE — Progress Notes (Signed)
   Subjective:    Patient ID: Roberto Dupesobert J Hennington Sr., male    DOB: 08/13/1921, 80 y.o.   MRN: [id number]  HPI  80 y.o remote smoker (quit in 20's) for FU of RML pulmonary nodule & ILD CT abdomen for renal stone in 10/2011 had shown ILD & RLL pleural based 3 mm nodule  CT chest 11/2012 showed underlying interstitial lung Disease. There was a a 1.1 x 0.7 x 2.0 cm elongated nodular opacity in the posterior aspect of the right middle lobe abutting the major fissure. There were ill-defined nodular areas of ground-glass attenuation bilaterally in a predominantly upper lung and peribronchovascular distribution, suggestive of an active atypical infectious process.  He is wheelchair bound due to bad knees.  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 There was mild aortic stenosis and moderate aortic insufficiency. There was mild to moderate mitral stenosis, nml RVSF . He has 3/ 6 ESM at base  Dyspnea is difficult to estimate due to his limited mobility, he reports class 2  05/08/2013  Chief Complaint  Patient presents with  . Follow-up    Breathing has been fine. Had CT in March. No complaints today   Previously identified right middle lobe pulmonary nodule previously noted along the right major fissure has resolved. Noted however is a pulmonary nodule in the right upper lobe that has increased slightly in size. Interim improvement in nodular ill-defined infiltrates . There is however mild increase in peribronchial interstitial changes noted throughout both lungs Large sliding hiatal hernia. ? component of aspiration pneumonitis  He denies heartburn or dysphagia Breathing is at baseline -no cough, wheeze, ambulates with walker    Review of Systems neg for any significant sore throat, dysphagia, itching, sneezing, nasal congestion or excess/ purulent secretions,  fever, chills, sweats, unintended wt loss, pleuritic or exertional cp, hempoptysis, orthopnea pnd or change in chronic leg swelling. Also denies presyncope, palpitations, heartburn, abdominal pain, nausea, vomiting, diarrhea or change in bowel or urinary habits, dysuria,hematuria, rash, arthralgias, visual complaints, headache, numbness weakness or ataxia.     Objective:   Physical Exam  Gen. Pleasant, well-nourished, in no distress ENT - no lesions, no post nasal drip Neck: No JVD, no thyromegaly, no carotid bruits Lungs: no use of accessory muscles, no dullness to percussion, bibasal 1/3 rales, no rhonchi  Cardiovascular: Rhythm regular, heart sounds  normal, ESM 3/6 at base, no gallops, no peripheral edema Musculoskeletal: No deformities, no cyanosis or clubbing        Assessment & Plan:

## 2013-05-08 NOTE — Assessment & Plan Note (Addendum)
Repeat CT scan in 6 months He does not seem to be a candidate for biopsy procedures

## 2013-05-09 NOTE — Assessment & Plan Note (Signed)
Appears stable to improvedLikely IPF in this age group, although radiology is not diagnostic Will not pursue other diagnostic procedures unless he becomes symptomatic. Doubt he will be able to perform PFT maneuver.

## 2013-05-17 ENCOUNTER — Telehealth: Payer: Self-pay | Admitting: Family Medicine

## 2013-05-17 DIAGNOSIS — N400 Enlarged prostate without lower urinary tract symptoms: Secondary | ICD-10-CM

## 2013-05-17 MED ORDER — TERAZOSIN HCL 2 MG PO CAPS: 2.0000 mg | ORAL_CAPSULE | Freq: Every day | ORAL | Status: DC

## 2013-05-17 NOTE — Telephone Encounter (Signed)
JWJXB'JWanda's number is [phone]

## 2013-05-17 NOTE — Telephone Encounter (Signed)
Roberto Day, Will you please let patient's daughter in law know that we'll decrease his terazosin to 2mg  daily, along with helping his prostate this medication will lower blood pressure.  Hopefully a lower dose will help with his clumsiness.  (BP log shows all normotensive since 5/5)

## 2013-05-17 NOTE — Telephone Encounter (Signed)
Roberto MortimerWanda came by. She dropped off bp numbers for Mr. Roberto MunroeJonas. He  is  complaining of prostate feeling swollen and  feeling light headed.  She has also noticed him being a liitle clumsy and believes the prostate med is a little too strong.

## 2013-05-17 NOTE — Telephone Encounter (Signed)
Daughter notified 

## 2013-05-22 ENCOUNTER — Encounter: Payer: Self-pay | Admitting: Family Medicine

## 2013-05-22 ENCOUNTER — Ambulatory Visit (INDEPENDENT_AMBULATORY_CARE_PROVIDER_SITE_OTHER): Payer: Commercial Managed Care - HMO | Admitting: Family Medicine

## 2013-05-22 ENCOUNTER — Ambulatory Visit (INDEPENDENT_AMBULATORY_CARE_PROVIDER_SITE_OTHER): Payer: Medicare HMO

## 2013-05-22 ENCOUNTER — Other Ambulatory Visit (HOSPITAL_BASED_OUTPATIENT_CLINIC_OR_DEPARTMENT_OTHER): Payer: Medicare HMO

## 2013-05-22 VITALS — BP 115/69 | HR 93 | Temp 98.1°F | Wt 139.0 lb

## 2013-05-22 DIAGNOSIS — R059 Cough, unspecified: Secondary | ICD-10-CM

## 2013-05-22 DIAGNOSIS — R5381 Other malaise: Secondary | ICD-10-CM

## 2013-05-22 DIAGNOSIS — R5383 Other fatigue: Secondary | ICD-10-CM

## 2013-05-22 DIAGNOSIS — R300 Dysuria: Secondary | ICD-10-CM

## 2013-05-22 DIAGNOSIS — R05 Cough: Secondary | ICD-10-CM

## 2013-05-22 LAB — POCT URINALYSIS DIPSTICK
BILIRUBIN UA: NEGATIVE
Glucose, UA: NEGATIVE
Ketones, UA: NEGATIVE
NITRITE UA: NEGATIVE
PROTEIN UA: NEGATIVE
Spec Grav, UA: 1.015
Urobilinogen, UA: 0.2
pH, UA: 7.5

## 2013-05-22 MED ORDER — LEVOFLOXACIN 500 MG PO TABS: 500.0000 mg | ORAL_TABLET | Freq: Every day | ORAL | Status: DC

## 2013-05-22 NOTE — Progress Notes (Signed)
CC: Roberto DupesRobert J Veale Sr. is a 80 y.o. male is here for not feeling well   Subjective: HPI:  Accompanied by [name]  Patient reports fatigue that is described as only wanted to sleep throughout the day. Described as awakening daily and take medications and go to bathroom other than that sleeping. Symptoms are moderate to severe in severity. Present for the last week. Have not been getting better or worsens onset. Originally was accompanied by a cough however this is slightly improved since taking Nasacort and Allegra. Accompanied by dysuria which  is localized in the shaft of the penis Without any no discharge or hematuria.  Accompanied with nausea, decreased appetite and a fall earlier today off of his bed. Recent medication changes include stopping doxazosin at home due to lightheadedness.  He denies fevers, chills, shortness of breath, wheezing, rashes, confusion, chest pain, abdominal pain, constipation, diarrhea.   Review Of Systems Outlined In HPI  Past Medical History  Diagnosis Date  . Unintentional weight loss 10/07/2011  . BPH (benign prostatic hyperplasia) 10/07/2011  . Hematuria 10/07/2011  . Bladder stone 10/07/2011  . Aortic stenosis     and aortic insufficiency  . Hyperlipidemia   . Bladder infection     hx of  . GERD (gastroesophageal reflux disease)     only take medication as needed  . Arthritis     "bilateral knees and right shoulder"  . Hypertension     Dr. Laren BoomSean Anandi Abramo, [facility]  . Pneumonia     "had it very frequently when I was a child" (12/05/2011)  . Nitrate poisoning 1945  . Stomach ulcer 1945    "caused by nitrate poisoning" (12/05/2011)  . Migraines     "went away inthe early 1940's" (12/05/2011)    Past Surgical History  Procedure Laterality Date  . Circumcision      1996  . Inner ear surgery  1948    "left; perforated eardrum now" (12/05/2011)  . Inguinal hernia repair  12/05/2011    bilaterally  . Tonsillectomy  1946  . Inguinal hernia  repair  12/05/2011    Procedure: HERNIA REPAIR INGUINAL ADULT BILATERAL;  Surgeon: Wilmon ArmsMatthew K. Corliss Skainssuei, MD;  Location: [facility] OR;  Service: General;  Laterality: Bilateral;  . Insertion of mesh  12/05/2011    Procedure: INSERTION OF MESH;  Surgeon: Wilmon ArmsMatthew K. Corliss Skainssuei, MD;  Location: [facility];  Service: General;  Laterality: Bilateral;   Family History  Problem Relation Age of Onset  . Heart attack Mother   . Stroke Mother     History   Social History  . Marital Status: Married    Spouse Name: N/A    Number of Children: 3  . Years of Education: N/A   Occupational History  . Not on file.   Social History Main Topics  . Smoking status: Former Smoker -- 0.50 packs/day for 5 years    Types: Cigarettes  . Smokeless tobacco: Never Used     Comment: 12/05/2011 "haven't smoked since age 1123-26"  . Alcohol Use: Yes     Comment: 12/05/2011 "very rarely have a beer"  . Drug Use: No  . Sexual Activity: No   Other Topics Concern  . Not on file   Social History Narrative  . No narrative on file     Objective: BP 115/69  Pulse 93  Temp(Src) 98.1 F (36.7 C) (Oral)  Wt 139 lb (63.05 kg)  SpO2 95%  General: Alert and Oriented, No Acute Distress However  appears moderately fatigued  HEENT: Pupils equal, round, reactive to light. Conjunctivae clear with mild clear discharge bilaterally.  External ears unremarkable, canals clear with intact TMs with appropriate landmarks.  Middle ear appears open without effusion. Pink inferior turbinates.  Moist mucous membranes, pharynx without inflammation nor lesions.   shotty right anterior chain cervical lymphadenopathy  Lungs: Clear to auscultation bilaterally, no wheezing/ronchi/rales.  Comfortable work of breathing. Good air movement. Cardiac: Regular rate and rhythm.  holosystolic murmur grade 3/6 heard left second intercostal space  Abdomen: Normal bowel sounds, soft and non tender without palpable masses. Extremities: No peripheral edema.  Strong peripheral  pulses.  Mental Status: No depression, anxiety, nor agitation. Skin: Warm and dry.  Assessment & Plan: Roberto Day was seen today for not feeling well.  Diagnoses and associated orders for this visit:  Fatigue - DG Chest 2 View; Future  Cough - DG Chest 2 View; Future  Dysuria - Urine culture    Fatigue with cough: Chest x-ray was obtained to rule out pneumonia fortunately no acute pulmonary process seen. He does have blood in his urine with leukocytes therefore will watch a culture and I encouraged him to start levofloxacin for UTI versus prostatitis coverage  Return if symptoms worsen or fail to improve.

## 2013-05-24 LAB — URINE CULTURE
Colony Count: NO GROWTH
Organism ID, Bacteria: NO GROWTH

## 2013-05-26 ENCOUNTER — Other Ambulatory Visit: Payer: Self-pay | Admitting: Family Medicine

## 2013-05-28 ENCOUNTER — Other Ambulatory Visit: Payer: Self-pay | Admitting: *Deleted

## 2013-05-28 MED ORDER — RANITIDINE HCL 150 MG PO TABS: ORAL_TABLET | ORAL | Status: DC

## 2013-05-29 ENCOUNTER — Other Ambulatory Visit: Payer: Self-pay | Admitting: *Deleted

## 2013-05-29 ENCOUNTER — Other Ambulatory Visit: Payer: Self-pay | Admitting: Family Medicine

## 2013-05-29 DIAGNOSIS — M19011 Primary osteoarthritis, right shoulder: Secondary | ICD-10-CM

## 2013-05-29 MED ORDER — HYDROCODONE-ACETAMINOPHEN 7.5-325 MG PO TABS: 1.0000 | ORAL_TABLET | Freq: Four times a day (QID) | ORAL | Status: DC | PRN

## 2013-05-30 ENCOUNTER — Telehealth: Payer: Self-pay | Admitting: *Deleted

## 2013-05-30 NOTE — Telephone Encounter (Signed)
Burna Mortimer called and states pt went to the eye doctor yesterday and after the dialated his pupils pt became dizzy and nauseaous and hasn't felt well since. Burna Mortimer states you guys discussed this during his last visit but it seems like he is not bouncing back after this episode. She states when the gabapentin seems to help but she wasn't sure what to do. Please advise

## 2013-05-31 MED ORDER — ONDANSETRON HCL 4 MG PO TABS: 4.0000 mg | ORAL_TABLET | Freq: Three times a day (TID) | ORAL | Status: DC | PRN

## 2013-05-31 NOTE — Telephone Encounter (Signed)
This is a common reaction to pupils being dilated, I'll send a Rx for ondansetron to [facility] to help with nausea which can persist for a day or two.

## 2013-05-31 NOTE — Telephone Encounter (Signed)
Notified Roberto Day and she states she thinks pt is very dehydrated which is contributing to his sxs. She has been forcing pedialyte which seems to help

## 2013-06-13 ENCOUNTER — Other Ambulatory Visit: Payer: Self-pay | Admitting: Family Medicine

## 2013-06-26 ENCOUNTER — Telehealth: Payer: Self-pay | Admitting: Family Medicine

## 2013-06-26 ENCOUNTER — Telehealth: Payer: Self-pay | Admitting: *Deleted

## 2013-06-26 ENCOUNTER — Other Ambulatory Visit: Payer: Self-pay | Admitting: Family Medicine

## 2013-06-26 DIAGNOSIS — M19011 Primary osteoarthritis, right shoulder: Secondary | ICD-10-CM

## 2013-06-26 DIAGNOSIS — M25562 Pain in left knee: Principal | ICD-10-CM

## 2013-06-26 DIAGNOSIS — M25561 Pain in right knee: Secondary | ICD-10-CM

## 2013-06-26 MED ORDER — HYDROCODONE-ACETAMINOPHEN 7.5-325 MG PO TABS: 1.0000 | ORAL_TABLET | Freq: Four times a day (QID) | ORAL | Status: DC | PRN

## 2013-06-26 NOTE — Telephone Encounter (Signed)
Daughter in law requests refill for pt. She is going out of town and wants to get them taken care of before she leaves

## 2013-06-26 NOTE — Telephone Encounter (Signed)
Mandy williams from Dr. Preston FleetingPills office has called requesting a referral for this patient that has already seen Dr. Corinna CapraPill with [facility] for Bilateral knee.. Thanks

## 2013-07-01 ENCOUNTER — Encounter: Payer: Self-pay | Admitting: Family Medicine

## 2013-07-01 DIAGNOSIS — H40009 Preglaucoma, unspecified, unspecified eye: Secondary | ICD-10-CM | POA: Insufficient documentation

## 2013-07-16 ENCOUNTER — Telehealth: Payer: Self-pay | Admitting: Family Medicine

## 2013-07-16 DIAGNOSIS — Z0110 Encounter for examination of ears and hearing without abnormal findings: Secondary | ICD-10-CM

## 2013-07-16 NOTE — Telephone Encounter (Signed)
Vernona RiegerLaura from [facility] called to let me know patient has an appt with their office and they need a referral and then need it submitted for an Auth with patients insurance

## 2013-07-16 NOTE — Telephone Encounter (Signed)
Referra placed. Daughter in law states they see ENT to have pt's ears cleaned and hearing checked

## 2013-07-27 ENCOUNTER — Other Ambulatory Visit: Payer: Self-pay | Admitting: Family Medicine

## 2013-07-29 ENCOUNTER — Other Ambulatory Visit: Payer: Self-pay | Admitting: *Deleted

## 2013-07-29 ENCOUNTER — Other Ambulatory Visit: Payer: Self-pay | Admitting: Family Medicine

## 2013-07-29 DIAGNOSIS — M19011 Primary osteoarthritis, right shoulder: Secondary | ICD-10-CM

## 2013-07-29 MED ORDER — HYDROCODONE-ACETAMINOPHEN 7.5-325 MG PO TABS: 1.0000 | ORAL_TABLET | Freq: Four times a day (QID) | ORAL | Status: DC | PRN

## 2013-07-29 NOTE — Telephone Encounter (Signed)
Roberto Day, Rx placed in in-box ready for pickup/faxing.  

## 2013-07-30 ENCOUNTER — Telehealth: Payer: Self-pay | Admitting: *Deleted

## 2013-07-30 NOTE — Telephone Encounter (Signed)
Voided rx for tramadol that was printed today.rx was printed and place up front along with other rx yesterday

## 2013-08-07 ENCOUNTER — Encounter: Payer: Self-pay | Admitting: Family Medicine

## 2013-08-07 ENCOUNTER — Ambulatory Visit (INDEPENDENT_AMBULATORY_CARE_PROVIDER_SITE_OTHER): Payer: Commercial Managed Care - HMO | Admitting: Family Medicine

## 2013-08-07 VITALS — BP 108/70 | HR 85 | Wt 148.0 lb

## 2013-08-07 DIAGNOSIS — N400 Enlarged prostate without lower urinary tract symptoms: Secondary | ICD-10-CM

## 2013-08-07 DIAGNOSIS — N183 Chronic kidney disease, stage 3 unspecified: Secondary | ICD-10-CM

## 2013-08-07 DIAGNOSIS — I050 Rheumatic mitral stenosis: Secondary | ICD-10-CM

## 2013-08-07 NOTE — Progress Notes (Signed)
CC: Roberto Day Sr. is a 80 y.o. male is here for 1 month f/u   Subjective: HPI:  Presents for routine followup with no acute complaints  Follow mitral valve stenosis: Patient states that he has not had any change or decline in physical fitness nor pulmonary status. He still independent in all activities of daily living without getting short of breath or fatigue. Denies orthopnea, PND, nor peripheral edema. No chest pain since I saw him last.  Followup BPH: His current urologist Dr. Simona Day will be leaving [facility] and patient believes that he'll need to referral due to his insurance.  He's currently taking Flomax on a daily basis without urinary frequency urgency nor with awakening more than one or 2 times a night to urinate  Follow chronic kidney disease: Currently not using any nonsteroidal anti-inflammatories and denies confusion, disorientation nor muscle cramps.   Review Of Systems Outlined In HPI  Past Medical History  Diagnosis Date  . Unintentional weight loss 10/07/2011  . BPH (benign prostatic hyperplasia) 10/07/2011  . Hematuria 10/07/2011  . Bladder stone 10/07/2011  . Aortic stenosis     and aortic insufficiency  . Hyperlipidemia   . Bladder infection     hx of  . GERD (gastroesophageal reflux disease)     only take medication as needed  . Arthritis     "bilateral knees and right shoulder"  . Hypertension     Dr. Laren Day, [facility]  . Pneumonia     "had it very frequently when I was a child" (12/05/2011)  . Nitrate poisoning 1945  . Stomach ulcer 1945    "caused by nitrate poisoning" (12/05/2011)  . Migraines     "went away inthe early 1940's" (12/05/2011)    Past Surgical History  Procedure Laterality Date  . Circumcision      1996  . Inner ear surgery  1948    "left; perforated eardrum now" (12/05/2011)  . Inguinal hernia repair  12/05/2011    bilaterally  . Tonsillectomy  1946  . Inguinal hernia repair  12/05/2011    Procedure: HERNIA REPAIR  INGUINAL ADULT BILATERAL;  Surgeon: Roberto Day. Roberto Skains, MD;  Location: [facility] OR;  Service: General;  Laterality: Bilateral;  . Insertion of mesh  12/05/2011    Procedure: INSERTION OF MESH;  Surgeon: Roberto Day. Roberto Skains, MD;  Location: [facility];  Service: General;  Laterality: Bilateral;   Family History  Problem Relation Age of Onset  . Heart attack Mother   . Stroke Mother     History   Social History  . Marital Status: Married    Spouse Name: N/A    Number of Children: 3  . Years of Education: N/A   Occupational History  . Not on file.   Social History Main Topics  . Smoking status: Former Smoker -- 0.50 packs/day for 5 years    Types: Cigarettes  . Smokeless tobacco: Never Used     Comment: 12/05/2011 "haven't smoked since age [age]"  . Alcohol Use: Yes     Comment: 12/05/2011 "very rarely have a beer"  . Drug Use: No  . Sexual Activity: No   Other Topics Concern  . Not on file   Social History Narrative  . No narrative on file     Objective: BP 108/70  Pulse 85  Wt 148 lb (67.132 kg)  General: Alert and Oriented, No Acute Distress HEENT: Pupils equal, round, reactive to light. Conjunctivae clear.  Moist membranes pharynx unremarkable  Lungs: Clear to auscultation bilaterally, no wheezing/ronchi/rales.  Comfortable work of breathing. Good air movement. Cardiac: Regular rate and rhythm. Normal S1/S2.  No murmurs, rubs, nor gallops.   Extremities: No peripheral edema.  Strong peripheral pulses.  Mental Status: No depression, anxiety, nor agitation. Skin: Warm and dry.  Assessment & Plan: Roberto MaduroRobert was seen today for 1 month f/u.  Diagnoses and associated orders for this visit:  CKD (chronic kidney disease) stage 3, GFR 30-59 ml/min - BASIC METABOLIC PANEL WITH GFR  BPH (benign prostatic hyperplasia)  Mitral valve stenosis  Other Orders - Ambulatory referral to Urology    Chronic kidney disease: Clinically controlled however Due for renal function BPH: Clinically  stable continue Flomax. Roberto MortimerWanda his daughter-in-law will let us know what the name of his [facility] urologist is at [facility] so I can place a referral Mitral valve stenosis: Currently asymptomatic and stable  Return in about 3 months (around 11/07/2013) for Routine Follow Up.

## 2013-08-08 LAB — BASIC METABOLIC PANEL WITH GFR
BUN: 20 mg/dL (ref 6–23)
CO2: 29 mEq/L (ref 19–32)
Calcium: 9.3 mg/dL (ref 8.4–10.5)
Chloride: 104 mEq/L (ref 96–112)
Creat: 1.28 mg/dL (ref 0.50–1.35)
GFR, EST NON AFRICAN AMERICAN: 49 mL/min — AB
GFR, Est African American: 57 mL/min — ABNORMAL LOW
Glucose, Bld: 102 mg/dL — ABNORMAL HIGH (ref 70–99)
POTASSIUM: 4.3 meq/L (ref 3.5–5.3)
SODIUM: 139 meq/L (ref 135–145)

## 2013-08-26 ENCOUNTER — Other Ambulatory Visit: Payer: Self-pay | Admitting: Family Medicine

## 2013-08-27 ENCOUNTER — Other Ambulatory Visit: Payer: Self-pay | Admitting: Family Medicine

## 2013-08-29 ENCOUNTER — Other Ambulatory Visit: Payer: Self-pay

## 2013-08-29 DIAGNOSIS — M19011 Primary osteoarthritis, right shoulder: Secondary | ICD-10-CM

## 2013-08-29 MED ORDER — HYDROCODONE-ACETAMINOPHEN 7.5-325 MG PO TABS: 1.0000 | ORAL_TABLET | Freq: Four times a day (QID) | ORAL | Status: DC | PRN

## 2013-08-29 NOTE — Telephone Encounter (Signed)
Patient will pick up Rx.

## 2013-09-03 ENCOUNTER — Other Ambulatory Visit: Payer: Self-pay | Admitting: Family Medicine

## 2013-09-26 ENCOUNTER — Other Ambulatory Visit: Payer: Self-pay | Admitting: Family Medicine

## 2013-09-30 ENCOUNTER — Other Ambulatory Visit: Payer: Self-pay | Admitting: *Deleted

## 2013-09-30 DIAGNOSIS — M19011 Primary osteoarthritis, right shoulder: Secondary | ICD-10-CM

## 2013-09-30 MED ORDER — HYDROCODONE-ACETAMINOPHEN 7.5-325 MG PO TABS: 1.0000 | ORAL_TABLET | Freq: Four times a day (QID) | ORAL | Status: DC | PRN

## 2013-10-06 ENCOUNTER — Other Ambulatory Visit: Payer: Self-pay | Admitting: Family Medicine

## 2013-10-10 ENCOUNTER — Other Ambulatory Visit: Payer: Self-pay | Admitting: Family Medicine

## 2013-10-21 ENCOUNTER — Other Ambulatory Visit: Payer: Self-pay | Admitting: Family Medicine

## 2013-10-26 ENCOUNTER — Other Ambulatory Visit: Payer: Self-pay | Admitting: Family Medicine

## 2013-10-30 ENCOUNTER — Other Ambulatory Visit: Payer: Self-pay | Admitting: *Deleted

## 2013-10-30 DIAGNOSIS — M19011 Primary osteoarthritis, right shoulder: Secondary | ICD-10-CM

## 2013-10-30 MED ORDER — HYDROCODONE-ACETAMINOPHEN 7.5-325 MG PO TABS: 1.0000 | ORAL_TABLET | Freq: Four times a day (QID) | ORAL | Status: DC | PRN

## 2013-11-08 ENCOUNTER — Ambulatory Visit (INDEPENDENT_AMBULATORY_CARE_PROVIDER_SITE_OTHER): Payer: Commercial Managed Care - HMO | Admitting: *Deleted

## 2013-11-08 ENCOUNTER — Ambulatory Visit (INDEPENDENT_AMBULATORY_CARE_PROVIDER_SITE_OTHER): Payer: Commercial Managed Care - HMO | Admitting: Family Medicine

## 2013-11-08 ENCOUNTER — Encounter: Payer: Self-pay | Admitting: Family Medicine

## 2013-11-08 VITALS — BP 129/75 | HR 69 | Wt 150.0 lb

## 2013-11-08 DIAGNOSIS — Z283 Underimmunization status: Secondary | ICD-10-CM

## 2013-11-08 DIAGNOSIS — Z2839 Other underimmunization status: Secondary | ICD-10-CM

## 2013-11-08 DIAGNOSIS — M19011 Primary osteoarthritis, right shoulder: Secondary | ICD-10-CM

## 2013-11-08 DIAGNOSIS — I700 Atherosclerosis of aorta: Secondary | ICD-10-CM

## 2013-11-08 DIAGNOSIS — R197 Diarrhea, unspecified: Secondary | ICD-10-CM

## 2013-11-08 DIAGNOSIS — N400 Enlarged prostate without lower urinary tract symptoms: Secondary | ICD-10-CM

## 2013-11-08 DIAGNOSIS — Z23 Encounter for immunization: Secondary | ICD-10-CM

## 2013-11-08 DIAGNOSIS — E785 Hyperlipidemia, unspecified: Secondary | ICD-10-CM

## 2013-11-08 MED ORDER — HYDROCODONE-ACETAMINOPHEN 7.5-325 MG PO TABS: 1.0000 | ORAL_TABLET | Freq: Four times a day (QID) | ORAL | Status: DC | PRN

## 2013-11-08 MED ORDER — TRAMADOL HCL 50 MG PO TABS: ORAL_TABLET | ORAL | Status: DC

## 2013-11-08 NOTE — Progress Notes (Signed)
CC: Roberto SchlatterRobert J Razzano Sr. is a 80 y.o. male is here for Follow-up   Subjective: HPI:  Follow-up BPH: Continues on terazosin and Flomax on a daily basis. He saw a urologist since I saw him last and was prescribed some other medication for his prostate, the name escapes him. On second day of taking his medication he had urinary retention and hesitancy. He stopped this medication after one week of taking it and symptoms resolved within a day. Currently he denies any genitourinary complaints at all.  Follow-up hyperlipidemia: Patient once he can stop any of his medications. He's never had a heart attack nor stroke but does have a CT scan showing arteriosclerosis of the aorta he denies any intolerance to atorvastatin.  Osteoarthritis: he suffers from osteoarthritis pain in the right shoulder and both knees. Aleve has caused diarrhea in the past. He's been taking hydrocodone on daily basis every 6 hours and provided he takes this at least 3 times a day he reports no pain whatsoever. There've been no falls nor constipation. He denies any new joint swelling pain redness or warmth.  He had diarrhea last night after eating a large amount chocolate. This morning he had a well formed painless bowel movement and his appetite is back. He denies any nausea or vomiting or abdominal pain  Review Of Systems Outlined In HPI  Past Medical History  Diagnosis Date  . Unintentional weight loss 10/07/2011  . BPH (benign prostatic hyperplasia) 10/07/2011  . Hematuria 10/07/2011  . Bladder stone 10/07/2011  . Aortic stenosis     and aortic insufficiency  . Hyperlipidemia   . Bladder infection     hx of  . GERD (gastroesophageal reflux disease)     only take medication as needed  . Arthritis     "bilateral knees and right shoulder"  . Hypertension     Dr. Laren BoomSean Choua Ikner, [place]  . Pneumonia     "had it very frequently when I was a child" (12/05/2011)  . Nitrate poisoning 1945  . Stomach ulcer 1945    "caused by nitrate poisoning" (12/05/2011)  . Migraines     "went away inthe early 1940's" (12/05/2011)    Past Surgical History  Procedure Laterality Date  . Circumcision      1996  . Inner ear surgery  1948    "left; perforated eardrum now" (12/05/2011)  . Inguinal hernia repair  12/05/2011    bilaterally  . Tonsillectomy  1946  . Inguinal hernia repair  12/05/2011    Procedure: HERNIA REPAIR INGUINAL ADULT BILATERAL;  Surgeon: Wilmon ArmsMatthew K. Corliss Skainssuei, MD;  Location: [facility] OR;  Service: General;  Laterality: Bilateral;  . Insertion of mesh  12/05/2011    Procedure: INSERTION OF MESH;  Surgeon: Wilmon ArmsMatthew K. Corliss Skainssuei, MD;  Location: [facility] OR;  Service: General;  Laterality: Bilateral;   Family History  Problem Relation Age of Onset  . Heart attack Mother   . Stroke Mother     History   Social History  . Marital Status: Married    Spouse Name: N/A    Number of Children: 3  . Years of Education: N/A   Occupational History  . Not on file.   Social History Main Topics  . Smoking status: Former Smoker -- 0.50 packs/day for 5 years    Types: Cigarettes  . Smokeless tobacco: Never Used     Comment: 12/05/2011 "haven't smoked since age [age]"  . Alcohol Use: Yes     Comment: 12/05/2011 "  very rarely have a beer"  . Drug Use: No  . Sexual Activity: No   Other Topics Concern  . Not on file   Social History Narrative     Objective: BP 129/75 mmHg  Pulse 69  Wt 150 lb (68.04 kg)  General: Alert and Oriented, No Acute Distress HEENT: Pupils equal, round, reactive to light. Conjunctivae clear.  Moist mucous membranes pharynx unremarkable Lungs: Clear to auscultation bilaterally, no wheezing/ronchi/rales.  Comfortable work of breathing. Good air movement. Cardiac: Regular rate and rhythm. Normal S1/S2.  Grade 2/6 crescendo decrescendo systolic murmur, no rubs nor gallops.   Abdomen: Normal bowel sounds, soft and non tender without palpable masses. Extremities: No peripheral edema.  Strong  peripheral pulses.  Mental Status: No depression, anxiety, nor agitation. Skin: Warm and dry.  Assessment & Plan: Roberto Day was seen today for follow-up.  Diagnoses and associated orders for this visit:  Immunization deficiency - Pneumococcal conjugate vaccine 13-valent  BPH (benign prostatic hyperplasia)  Hyperlipidemia  Osteoarthritis of right glenohumeral joint - HYDROcodone-acetaminophen (NORCO) 7.5-325 MG per tablet; Take 1 tablet by mouth every 6 (six) hours as needed.  Diarrhea  Other Orders - traMADol (ULTRAM) 50 MG tablet; TAKE ONE TABLET BY MOUTH EVERY 8 HOURS AS NEEDED FOR PAIN    BPH: Controlled, continue Flomax and Hytrin Osteoarthritis: Controlled with as needed hydrocodone use Hyperlipidemia: We discussed the risks and benefits of using a statin at his age. Joint decision to stop this for 3 months then recheck cholesterol to try and estimate his 10 year risk of having a heart attack to help counsel him on whether or not he should continue on atorvastatin at his age keeping in mind his arthroscleroses of the aorta.  Return in about 3 months (around 02/08/2014).

## 2013-11-10 ENCOUNTER — Other Ambulatory Visit: Payer: Self-pay | Admitting: Family Medicine

## 2013-11-11 ENCOUNTER — Other Ambulatory Visit: Payer: Self-pay | Admitting: Family Medicine

## 2013-11-18 ENCOUNTER — Ambulatory Visit (HOSPITAL_BASED_OUTPATIENT_CLINIC_OR_DEPARTMENT_OTHER)
Admission: RE | Admit: 2013-11-18 | Discharge: 2013-11-18 | Disposition: A | Discharge status: Home / Self Care | Payer: Medicare HMO | Source: Ambulatory Visit | Attending: Pulmonary Disease | Admitting: Pulmonary Disease

## 2013-11-18 ENCOUNTER — Telehealth: Payer: Self-pay | Admitting: Family Medicine

## 2013-11-18 ENCOUNTER — Other Ambulatory Visit: Payer: Commercial Managed Care - HMO

## 2013-11-18 DIAGNOSIS — J8410 Pulmonary fibrosis, unspecified: Secondary | ICD-10-CM

## 2013-11-18 DIAGNOSIS — R911 Solitary pulmonary nodule: Secondary | ICD-10-CM

## 2013-11-18 NOTE — Telephone Encounter (Signed)
[name] called. Mr. Roberto Day is due to come in for his 6 mnth follow up and they will need us to put in a referral-daignosis code is R91.1 & J84.10. He will be seeing Dr, Granville LewisAlva/NPI #[id number] Thank you

## 2013-11-19 NOTE — Telephone Encounter (Signed)
Referral has been placed. 

## 2013-11-21 ENCOUNTER — Ambulatory Visit (INDEPENDENT_AMBULATORY_CARE_PROVIDER_SITE_OTHER): Payer: Commercial Managed Care - HMO | Admitting: Pulmonary Disease

## 2013-11-21 ENCOUNTER — Encounter: Payer: Self-pay | Admitting: Pulmonary Disease

## 2013-11-21 VITALS — BP 84/64 | HR 58 | Ht 64.0 in | Wt 151.0 lb

## 2013-11-21 DIAGNOSIS — R911 Solitary pulmonary nodule: Secondary | ICD-10-CM

## 2013-11-21 DIAGNOSIS — J8410 Pulmonary fibrosis, unspecified: Secondary | ICD-10-CM

## 2013-11-21 NOTE — Patient Instructions (Signed)
You have fibrosis in your lung You have a narrow aortic valve You have a hiatal hernia Call me if breathing worse

## 2013-11-21 NOTE — Progress Notes (Signed)
   Subjective:    Patient ID: Roberto Dupesobert J Skowron Sr., male    DOB: 08/10/1921, 80 y.o.   MRN: [id number]  HPI  80 y.o remote smoker (quit in 20's) for FU of RML pulmonary nodule & ILD  He is wheelchair bound due to bad knees.  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 There was mild aortic stenosis and moderate aortic insufficiency. There was mild to moderate mitral stenosis, nml RVSF . He has 3/ 6 ESM at base   CT abdomen for renal stone in 10/2011 had shown ILD & RLL pleural based 3 mm nodule  CT chest 11/2012 showed underlying interstitial lung Disease. There was a a 1.1 x 0.7 x 2.0 cm elongated nodular opacity in the posterior aspect of the right middle lobe abutting the major fissure. There were ill-defined nodular areas of ground-glass attenuation bilaterally in a predominantly upper lung and peribronchovascular distribution, suggestive of an active atypical infectious process.   05/08/2013 CT chest - RML pulmonary nodule previously noted along the right major fissure has resolved. RUL nodule has increased slightly in size. Interim improvement in nodular ill-defined infiltrates . - mild increase in peribronchial interstitial changes noted throughout both lungs Large sliding hiatal hernia. ? component of aspiration pneumonitis    11/21/2013  Chief Complaint  Patient presents with  . Follow-up    sinus drainage, coughing up white, light yellow mucus   Accompanied by daughter-in-law Dyspnea is difficult to estimate due to his limited mobility, he reports class 2 He denies heartburn or dysphagia Breathing is at baseline -no cough, wheeze, ambulates with walker, limited by bad knees CT 11/2013 chronic pulmonary scarring changes with new areas of inflammation most notable in the right upper lobe posteriorly, RLL nodule  faint -improved  Review of Systems neg for any significant sore throat, dysphagia, itching, sneezing, nasal congestion or excess/ purulent secretions, fever, chills, sweats, unintended wt loss, pleuritic or exertional cp, hempoptysis, orthopnea pnd or change in chronic leg swelling. Also denies presyncope, palpitations, heartburn, abdominal pain, nausea, vomiting, diarrhea or change in bowel or urinary habits, dysuria,hematuria, rash, arthralgias, visual complaints, headache, numbness weakness or ataxia.     Objective:   Physical Exam  Gen. Pleasant, well-nourished, in no distress ENT - no lesions, no post nasal drip Neck: No JVD, no thyromegaly, no carotid bruits Lungs: no use of accessory muscles, no dullness to percussion, bibasal rales, no rhonchi  Cardiovascular: Rhythm regular, heart sounds  normal, no murmurs or gallops, no peripheral edema Musculoskeletal: No deformities, no cyanosis or clubbing        Assessment & Plan:

## 2013-11-25 NOTE — Assessment & Plan Note (Signed)
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 I discussed natural course of this disease He is not a candidate for anti-fibrotic therapy, based on advanced age

## 2013-11-25 NOTE — Assessment & Plan Note (Signed)
This does not appear to be significant and does not need serial follow-up

## 2013-12-04 ENCOUNTER — Encounter: Payer: Self-pay | Admitting: Emergency Medicine

## 2013-12-04 ENCOUNTER — Emergency Department
Admission: EM | Admit: 2013-12-04 | Discharge: 2013-12-04 | Disposition: A | Discharge status: Other Acute Inpatient Hospital | Payer: Commercial Managed Care - HMO | Source: Home / Self Care | Attending: Family Medicine | Admitting: Family Medicine

## 2013-12-04 DIAGNOSIS — R112 Nausea with vomiting, unspecified: Secondary | ICD-10-CM

## 2013-12-04 DIAGNOSIS — R197 Diarrhea, unspecified: Secondary | ICD-10-CM

## 2013-12-04 DIAGNOSIS — E860 Dehydration: Secondary | ICD-10-CM

## 2013-12-04 NOTE — ED Notes (Signed)
Patient has been ill for past 4 days; nausea, vomiting, some black-ish diarrhea, not keeping food/fluids down, has not taken regular meds x 2 days, some cough and general aches. Had Zofran at 5:30a.m. today, as well as last night. Lucid, but weak and states feels dizzy; came in with walker; family member present.

## 2013-12-04 NOTE — ED Provider Notes (Signed)
CSN: [id number]     Arrival date & time 12/04/13  0913 History   First MD Initiated Contact with Patient 12/04/13 1001     Chief Complaint  Patient presents with  . Generalized Body Aches  . Emesis  . Diarrhea  . Nasal Congestion  . Cough  . Chills      HPI Comments: Patient has had URI symptoms for about 2 weeks, and became more ill about four days ago with nausea, vomiting, and loose dark stools.  He has been unable to tolerate fluids despite Zofran.  He is still able to ambulate with a walker.  The history is provided by the patient and a relative.    Past Medical History  Diagnosis Date  . Unintentional weight loss 10/07/2011  . BPH (benign prostatic hyperplasia) 10/07/2011  . Hematuria 10/07/2011  . Bladder stone 10/07/2011  . Aortic stenosis     and aortic insufficiency  . Hyperlipidemia   . Bladder infection     hx of  . GERD (gastroesophageal reflux disease)     only take medication as needed  . Arthritis     "bilateral knees and right shoulder"  . Hypertension     Dr. Laren BoomSean Hommel,  primary care  . Pneumonia     "had it very frequently when I was a child" (12/05/2011)  . Nitrate poisoning 1945  . Stomach ulcer 1945    "caused by nitrate poisoning" (12/05/2011)  . Migraines     "went away inthe early 1940's" (12/05/2011)   Past Surgical History  Procedure Laterality Date  . Circumcision      1996  . Inner ear surgery  1948    "left; perforated eardrum now" (12/05/2011)  . Inguinal hernia repair  12/05/2011    bilaterally  . Tonsillectomy  1946  . Inguinal hernia repair  12/05/2011    Procedure: HERNIA REPAIR INGUINAL ADULT BILATERAL;  Surgeon: Wilmon ArmsMatthew K. Corliss Skainssuei, MD;  Location: [facility] OR;  Service: General;  Laterality: Bilateral;  . Insertion of mesh  12/05/2011    Procedure: INSERTION OF MESH;  Surgeon: Wilmon ArmsMatthew K. Corliss Skainssuei, MD;  Location: [facility] OR;  Service: General;  Laterality: Bilateral;   Family History  Problem Relation Age of Onset  . Heart attack Mother    . Stroke Mother    History  Substance Use Topics  . Smoking status: Former Smoker -- 0.50 packs/day for 5 years    Types: Cigarettes  . Smokeless tobacco: Never Used     Comment: 12/05/2011 "haven't smoked since age [age]"  . Alcohol Use: Yes     Comment: 12/05/2011 "very rarely have a beer"    Review of Systems  Constitutional: Positive for fever, chills, diaphoresis, activity change, appetite change and fatigue.  HENT: Positive for congestion, postnasal drip and rhinorrhea. Negative for ear pain and sore throat.   Eyes: Negative.   Respiratory: Positive for cough. Negative for chest tightness, shortness of breath and wheezing.   Cardiovascular: Positive for leg swelling. Negative for chest pain and palpitations.  Gastrointestinal: Positive for nausea, vomiting and diarrhea. Negative for abdominal pain and blood in stool.  Genitourinary: Negative for difficulty urinating.  Musculoskeletal: Negative for arthralgias.  Skin: Negative.   Neurological: Positive for dizziness. Negative for headaches.    Allergies  Codeine; Celebrex; and Aleve  Home Medications   Prior to Admission medications   Medication Sig Start Date End Date Taking? Authorizing Provider  betaxolol (BETOPTIC-S) 0.25 % ophthalmic suspension Place 1 drop into both eyes  2 (two) times daily. 11/04/11   Laren BoomSean Hommel, DO  Docusate Sodium (COLACE PO) Take by mouth.    Historical Provider, MD  furosemide (LASIX) 20 MG tablet Take 1 tablet (20 mg total) by mouth every other day. 11/21/12   Lewayne BuntingBrian S Crenshaw, MD  gabapentin (NEURONTIN) 300 MG capsule TAKE ONE CAPSULE BY MOUTH 4 TIMES DAILY 11/11/13   Laren BoomSean Hommel, DO  HYDROcodone-acetaminophen (NORCO) 7.5-325 MG per tablet Take 1 tablet by mouth every 6 (six) hours as needed. 11/08/13   Laren BoomSean Hommel, DO  Multiple Vitamins-Minerals (CENTRUM SILVER PO) Take 1 tablet by mouth daily.     Historical Provider, MD  Multiple Vitamins-Minerals (PRESERVISION AREDS PO) Take by mouth 2 (two)  times daily.    Historical Provider, MD  ondansetron (ZOFRAN) 4 MG tablet Take 1-2 tablets (4-8 mg total) by mouth every 8 (eight) hours as needed for nausea or vomiting. 05/31/13   Laren BoomSean Hommel, DO  Potassium Gluconate 550 MG TABS Take 1 tablet (550 mg total) by mouth daily. 11/04/11   Sean Hommel, DO  ranitidine (ZANTAC) 150 MG tablet TAKE ONE TABLET BY MOUTH TWICE DAILY 10/28/13   Laren BoomSean Hommel, DO  Saw Palmetto, Serenoa repens, (SAW PALMETTO PO) Take 1 tablet by mouth daily.     Historical Provider, MD  Tamsulosin HCl (FLOMAX) 0.4 MG CAPS Take 1 capsule (0.4 mg total) by mouth daily. 11/04/11   Laren BoomSean Hommel, DO  terazosin (HYTRIN) 2 MG capsule TAKE ONE CAPSULE BY MOUTH AT BEDTIME 11/12/13   Sean Hommel, DO  traMADol (ULTRAM) 50 MG tablet TAKE ONE TABLET BY MOUTH EVERY 8 HOURS AS NEEDED FOR PAIN 11/08/13   Sean Hommel, DO   BP 144/84 mmHg  Pulse 105  Temp(Src) 98.1 F (36.7 C) (Oral)  Resp 32  SpO2 97% Physical Exam Nursing notes and Vital Signs reviewed. Appearance:  Patient appears cachectic, stated age, and in no acute distress.  He is alert but somewhat confused. Eyes:  Pupils are equal, round, and reactive to light and accomodation.  Extraocular movement is intact.  Conjunctivae are not inflamed  Ears:  Canals normal.  Tympanic membranes normal.  Nose:  Normal turbinates.  No sinus tenderness.    Pharynx:   Dry mucous membranes. Neck:  Supple.   No adenopathy Lungs:  Clear to auscultation.  Breath sounds are equal.  Respiratory rate 20 Heart:  Regular rate and rhythm without murmurs, rubs, or gallops.  Rate 80 Abdomen:  Nontender without masses or hepatosplenomegaly.  Bowel sounds are present.  No CVA or flank tenderness.  Extremities:  Pitting edema.  No calf tenderness Skin:  Pale; no rash present.   ED Course  Procedures  none     MDM   1. Nausea and vomiting, vomiting of unspecified type   2. Diarrhea   3. Dehydration    Patient's vital signs stable. Patient and family  member [facility] and [facility].    Lattie HawStephen A Kaylin Schellenberg, MD 12/08/13 [phone]

## 2013-12-08 NOTE — Discharge Instructions (Signed)
Dehydration, Adult Dehydration is when you lose more fluids from the body than you take in. Vital organs like the kidneys, brain, and heart cannot function without a proper amount of fluids and salt. Any loss of fluids from the body can cause dehydration.  CAUSES   Vomiting.  Diarrhea.  Excessive sweating.  Excessive urine output.  Fever. SYMPTOMS  Mild dehydration  Thirst.  Dry lips.  Slightly dry mouth. Moderate dehydration  Very dry mouth.  Sunken eyes.  Skin does not bounce back quickly when lightly pinched and released.  Dark urine and decreased urine production.  Decreased tear production.  Headache. Severe dehydration  Very dry mouth.  Extreme thirst.  Rapid, weak pulse (more than 100 beats per minute at rest).  Cold hands and feet.  Not able to sweat in spite of heat and temperature.  Rapid breathing.  Blue lips.  Confusion and lethargy.  Difficulty being awakened.  Minimal urine production.  No tears. DIAGNOSIS  Your caregiver will diagnose dehydration based on your symptoms and your exam. Blood and urine tests will help confirm the diagnosis. The diagnostic evaluation should also identify the cause of dehydration. TREATMENT  Treatment of mild or moderate dehydration can often be done at home by increasing the amount of fluids that you drink. It is best to drink small amounts of fluid more often. Drinking too much at one time can make vomiting worse. Refer to the home care instructions below. Severe dehydration needs to be treated at the hospital where you will probably be given intravenous (IV) fluids that contain water and electrolytes. HOME CARE INSTRUCTIONS   Ask your caregiver about specific rehydration instructions.  Drink enough fluids to keep your urine clear or pale yellow.  Drink small amounts frequently if you have nausea and vomiting.  Eat as you normally do.  Avoid:  Foods or drinks high in sugar.  Carbonated  drinks.  Juice.  Extremely hot or cold fluids.  Drinks with caffeine.  Fatty, greasy foods.  Alcohol.  Tobacco.  Overeating.  Gelatin desserts.  Wash your hands well to avoid spreading bacteria and viruses.  Only take over-the-counter or prescription medicines for pain, discomfort, or fever as directed by your caregiver.  Ask your caregiver if you should continue all prescribed and over-the-counter medicines.  Keep all follow-up appointments with your caregiver. SEEK MEDICAL CARE IF:  You have abdominal pain and it increases or stays in one area (localizes).  You have a rash, stiff neck, or severe headache.  You are irritable, sleepy, or difficult to awaken.  You are weak, dizzy, or extremely thirsty. SEEK IMMEDIATE MEDICAL CARE IF:   You are unable to keep fluids down or you get worse despite treatment.  You have frequent episodes of vomiting or diarrhea.  You have blood or green matter (bile) in your vomit.  You have blood in your stool or your stool looks black and tarry.  You have not urinated in 6 to 8 hours, or you have only urinated a small amount of very dark urine.  You have a fever.  You faint. MAKE SURE YOU:   Understand these instructions.  Will watch your condition.  Will get help right away if you are not doing well or get worse. Document Released: 12/20/2004 Document Revised: 03/14/2011 Document Reviewed: 08/09/2010 ExitCare Patient Information 2015 [employer]. This information is not intended to replace advice given to you by your health care provider. Make sure you discuss any questions you have with your health care   provider.  

## 2013-12-10 ENCOUNTER — Telehealth: Payer: Self-pay | Admitting: Family Medicine

## 2013-12-10 NOTE — Telephone Encounter (Signed)
Care everywhere 

## 2013-12-12 ENCOUNTER — Encounter: Payer: Self-pay | Admitting: Family Medicine

## 2013-12-12 ENCOUNTER — Ambulatory Visit (INDEPENDENT_AMBULATORY_CARE_PROVIDER_SITE_OTHER): Payer: Commercial Managed Care - HMO | Admitting: Family Medicine

## 2013-12-12 ENCOUNTER — Other Ambulatory Visit: Payer: Self-pay | Admitting: Family Medicine

## 2013-12-12 VITALS — BP 123/62 | HR 87 | Temp 97.7°F | Wt 153.0 lb

## 2013-12-12 DIAGNOSIS — J189 Pneumonia, unspecified organism: Secondary | ICD-10-CM

## 2013-12-12 MED ORDER — LEVOFLOXACIN 750 MG PO TABS: 750.0000 mg | ORAL_TABLET | Freq: Every day | ORAL | Status: DC

## 2013-12-12 NOTE — Progress Notes (Signed)
CC: Roberto SchlatterRobert J Day is a 80 y.o. male is here for f/u pneumonia   Subjective: HPI:  On the second of this month patient was seen at a local emergency room for abdominal pain and found to have a left lower lobe pneumonia on a CT scan and also seen on x-ray. He was given clarithromycin which she has finished as of yesterday. Patient tells me that the first day he was taking as he started to feel somewhat improved but this changed over the course of 24 hours and for the past estimated 7 days he has felt moderately severe fatigue and persistent productive cough that has not improved since his ER visit. He originally had abdominal pain but that has not seemed to bother him today. Family notices that he is pretty much sleeping all day long ecchymoses having a snack or going to the bathroom. Family thinks that his cough is not improved at all. He denies any fevers or shortness of breath but family members report that his breathing looks more labored. No interventions other than above. States he just feels "miserable". There is been no blood in sputum, chest pain, orthopnea, confusion, diarrhea, constipation or genitourinary complaints.   Review Of Systems Outlined In HPI  Past Medical History  Diagnosis Date  . Unintentional weight loss 10/07/2011  . BPH (benign prostatic hyperplasia) 10/07/2011  . Hematuria 10/07/2011  . Bladder stone 10/07/2011  . Aortic stenosis     and aortic insufficiency  . Hyperlipidemia   . Bladder infection     hx of  . GERD (gastroesophageal reflux disease)     only take medication as needed  . Arthritis     "bilateral knees and right shoulder"  . Hypertension     Dr. Laren BoomSean Osher Oettinger, [facility]  . Pneumonia     "had it very frequently when I was a child" (12/05/2011)  . Nitrate poisoning 1945  . Stomach ulcer 1945    "caused by nitrate poisoning" (12/05/2011)  . Migraines     "went away inthe early 1940's" (12/05/2011)    Past Surgical History  Procedure  Laterality Date  . Circumcision      1996  . Inner ear surgery  1948    "left; perforated eardrum now" (12/05/2011)  . Inguinal hernia repair  12/05/2011    bilaterally  . Tonsillectomy  1946  . Inguinal hernia repair  12/05/2011    Procedure: HERNIA REPAIR INGUINAL ADULT BILATERAL;  Surgeon: Wilmon ArmsMatthew K. Corliss Skainssuei, MD;  Location: [facility] OR;  Service: General;  Laterality: Bilateral;  . Insertion of mesh  12/05/2011    Procedure: INSERTION OF MESH;  Surgeon: Wilmon ArmsMatthew K. Corliss Skainssuei, MD;  Location: [facility] OR;  Service: General;  Laterality: Bilateral;   Family History  Problem Relation Age of Onset  . Heart attack Mother   . Stroke Mother     History   Social History  . Marital Status: Married    Spouse Name: N/A    Number of Children: 3  . Years of Education: N/A   Occupational History  . Not on file.   Social History Main Topics  . Smoking status: Former Smoker -- 0.50 packs/day for 5 years    Types: Cigarettes  . Smokeless tobacco: Never Used     Comment: 12/05/2011 "haven't smoked since age [age]"  . Alcohol Use: Yes     Comment: 12/05/2011 "very rarely have a beer"  . Drug Use: No  . Sexual Activity: No   Other Topics  Concern  . Not on file   Social History Narrative     Objective: BP 123/62 mmHg  Pulse 87  Temp(Src) 97.7 F (36.5 C) (Oral)  Wt 153 lb (69.4 kg)  SpO2 96%  General: Alert and Oriented, No Acute Distress however mild fatigue. HEENT: Pupils equal, round, reactive to light. Conjunctivae clear.  Moist mucous membranes pharynx unremarkable Lungs: Comfortable work of breathing without retractions or accessory muscle use. No wheezing or rhonchi, he does have moderate rales in the left lower lung field but these clear in all other lung fields. Cardiac: Regular rate and rhythm. Normal S1/S2.   Abdomen: Soft nontender Extremities: No peripheral edema.  Strong peripheral pulses.  Mental Status: No depression, anxiety, nor agitation. Skin: Warm and dry.  Assessment &  Plan: Roberto MaduroRobert was seen today for f/u pneumonia.  Diagnoses and associated orders for this visit:  CAP (community acquired pneumonia) - levofloxacin (LEVAQUIN) 750 MG tablet; Take 1 tablet (750 mg total) by mouth daily.    Community acquired pneumonia: It appears that clarithromycin was ineffective at providing him significant improvement, begin Levaquin. Signs and symptoms requring emergent/urgent reevaluation were discussed with the patient. Fortunately I do not think he needs hospitalization at this time but signs and symptoms were discussed that would warrant emergent evaluation.  Return if symptoms worsen or fail to improve.

## 2013-12-17 ENCOUNTER — Telehealth: Payer: Self-pay | Admitting: *Deleted

## 2013-12-17 DIAGNOSIS — M170 Bilateral primary osteoarthritis of knee: Secondary | ICD-10-CM

## 2013-12-17 DIAGNOSIS — M19011 Primary osteoarthritis, right shoulder: Secondary | ICD-10-CM

## 2013-12-17 NOTE — Telephone Encounter (Signed)
Pt's daughter request referral to Dr. Corinna CapraPill. He is already a pt of his.Their insurance is requiring a referall

## 2013-12-21 ENCOUNTER — Other Ambulatory Visit: Payer: Self-pay | Admitting: Family Medicine

## 2013-12-30 ENCOUNTER — Telehealth: Payer: Self-pay | Admitting: *Deleted

## 2013-12-30 DIAGNOSIS — M19011 Primary osteoarthritis, right shoulder: Secondary | ICD-10-CM

## 2013-12-30 MED ORDER — HYDROCODONE-ACETAMINOPHEN 7.5-325 MG PO TABS: 1.0000 | ORAL_TABLET | Freq: Four times a day (QID) | ORAL | Status: DC | PRN

## 2013-12-30 NOTE — Telephone Encounter (Signed)
rx

## 2014-01-07 ENCOUNTER — Telehealth: Payer: Self-pay | Admitting: *Deleted

## 2014-01-07 NOTE — Telephone Encounter (Signed)
[name] (daughter in law) request a letter to be written to excuse pt from jury duty permanently. He is scheduled to report to jury duty on 2/10 I have been unable to reach this patient by phone.  A letter is up front for [name] to pick up

## 2014-01-09 ENCOUNTER — Other Ambulatory Visit: Payer: Self-pay

## 2014-01-09 DIAGNOSIS — I050 Rheumatic mitral stenosis: Secondary | ICD-10-CM

## 2014-01-09 DIAGNOSIS — I351 Nonrheumatic aortic (valve) insufficiency: Secondary | ICD-10-CM

## 2014-01-09 MED ORDER — FUROSEMIDE 20 MG PO TABS: 20.0000 mg | ORAL_TABLET | ORAL | Status: DC

## 2014-01-14 ENCOUNTER — Encounter: Payer: Self-pay | Admitting: Family Medicine

## 2014-01-14 ENCOUNTER — Ambulatory Visit (INDEPENDENT_AMBULATORY_CARE_PROVIDER_SITE_OTHER): Payer: Commercial Managed Care - HMO | Admitting: Family Medicine

## 2014-01-14 VITALS — BP 136/78 | HR 72 | Temp 98.4°F | Wt 143.0 lb

## 2014-01-14 DIAGNOSIS — R1031 Right lower quadrant pain: Secondary | ICD-10-CM

## 2014-01-14 NOTE — Patient Instructions (Signed)
Call me if any groin pain/swelling returns, the next step would be a CT scan of the abdomen and pelvis with contrast to rule out inguinal hernia.

## 2014-01-14 NOTE — Progress Notes (Signed)
CC: Roberto SchlatterRobert J Day is a 81 y.o. male is here for Hernia   Subjective: HPI:  Right groin swelling and pain that began Saturday while lying down in bed and turning over. Symptoms were sudden, mild in severity with respect to pain, swelling was the size of the ping-pong ball per his report. Symptoms were improved with light rubbing of the right groin. Symptoms resolved within 2 hours after onset. He's never had this before. Since resolution on Saturday night he has had no return of either symptom. He states he feels like his normal self right now with any gastrointestinal complaints or genitourinary complaints other than that above. When chief complaint occurred he denies any nausea, upset stomach, or any other gastrointestinal issues. Since the issue occurred he's engaged in Valsalva with straining to defecate has had no return of symptoms. He denies testicular pain, scrotal swelling nor flank pain.   Review Of Systems Outlined In HPI  Past Medical History  Diagnosis Date  . Unintentional weight loss 10/07/2011  . BPH (benign prostatic hyperplasia) 10/07/2011  . Hematuria 10/07/2011  . Bladder stone 10/07/2011  . Aortic stenosis     and aortic insufficiency  . Hyperlipidemia   . Bladder infection     hx of  . GERD (gastroesophageal reflux disease)     only take medication as needed  . Arthritis     "bilateral knees and right shoulder"  . Hypertension     Dr. Laren BoomSean Jessikah Dicker,  primary care  . Pneumonia     "had it very frequently when I was a child" (12/05/2011)  . Nitrate poisoning 1945  . Stomach ulcer 1945    "caused by nitrate poisoning" (12/05/2011)  . Migraines     "went away inthe early 1940's" (12/05/2011)    Past Surgical History  Procedure Laterality Date  . Circumcision      1996  . Inner ear surgery  1948    "left; perforated eardrum now" (12/05/2011)  . Inguinal hernia repair  12/05/2011    bilaterally  . Tonsillectomy  1946  . Inguinal hernia repair  12/05/2011    Procedure: HERNIA REPAIR INGUINAL ADULT BILATERAL;  Surgeon: Wilmon ArmsMatthew K. Corliss Skainssuei, MD;  Location: [facility];  Service: General;  Laterality: Bilateral;  . Insertion of mesh  12/05/2011    Procedure: INSERTION OF MESH;  Surgeon: Wilmon ArmsMatthew K. Corliss Skainssuei, MD;  Location: [facility];  Service: General;  Laterality: Bilateral;   Family History  Problem Relation Age of Onset  . Heart attack Mother   . Stroke Mother     History   Social History  . Marital Status: Married    Spouse Name: N/A    Number of Children: 3  . Years of Education: N/A   Occupational History  . Not on file.   Social History Main Topics  . Smoking status: Former Smoker -- 0.50 packs/Day for 5 years    Types: Cigarettes  . Smokeless tobacco: Never Used     Comment: 12/05/2011 "haven't smoked since age [age]"  . Alcohol Use: Yes     Comment: 12/05/2011 "very rarely have a beer"  . Drug Use: No  . Sexual Activity: No   Other Topics Concern  . Not on file   Social History Narrative     Objective: BP 136/78 mmHg  Pulse 72  Temp(Src) 98.4 F (36.9 C) (Oral)  Wt 143 lb (64.864 kg)  SpO2 98%  General: Alert and Oriented, No Acute Distress HEENT: Pupils equal, round, reactive to  light. Conjunctivae clear.   Lungs: Clear to auscultation bilaterally, no wheezing/ronchi/rales.  Comfortable work of breathing. Good air movement. Cardiac: Regular rate and rhythm. Normal S1/S2.  No murmurs, rubs, nor gallops.   Genitourinary: Penis unremarkable, nontender descended right testicle without palpable abnormality in the scrotal sac. No palpable inguinal hernia or masses. No pain with palpation of the right inguinal and scrotal region. Extremities: No peripheral edema.  Strong peripheral pulses.  Mental Status: No depression, anxiety, nor agitation. Skin: Warm and dry.  Assessment & Plan: Roberto Day was seen today for hernia.  Diagnoses and associated orders for this visit:  Groin pain, right   Right groin pain: Since I do not feel any  palpable abnormality or abdominal wall defect today we will take a watch and wait approach to his issue. If symptoms return have asked him to call me and we will arrange a CT scan as soon as possible and he does not necessarily need to see me before the CT scan unless symptoms are changed from how he describes them today. Signs and symptoms requring emergent/urgent reevaluation were discussed with the patient.  25 minutes spent face-to-face during visit today of which at least 50% was counseling or coordinating care regarding: 1. Groin pain, right      Return if symptoms worsen or fail to improve.

## 2014-01-28 ENCOUNTER — Other Ambulatory Visit: Payer: Self-pay | Admitting: *Deleted

## 2014-01-28 ENCOUNTER — Other Ambulatory Visit: Payer: Self-pay | Admitting: Family Medicine

## 2014-01-28 DIAGNOSIS — M19011 Primary osteoarthritis, right shoulder: Secondary | ICD-10-CM

## 2014-01-28 MED ORDER — HYDROCODONE-ACETAMINOPHEN 7.5-325 MG PO TABS: 1.0000 | ORAL_TABLET | Freq: Four times a day (QID) | ORAL | Status: DC | PRN

## 2014-01-29 ENCOUNTER — Other Ambulatory Visit: Payer: Self-pay | Admitting: Family Medicine

## 2014-01-30 ENCOUNTER — Other Ambulatory Visit: Payer: Self-pay | Admitting: *Deleted

## 2014-01-30 MED ORDER — TRAMADOL HCL 50 MG PO TABS: 50.0000 mg | ORAL_TABLET | Freq: Three times a day (TID) | ORAL | Status: DC | PRN

## 2014-02-13 ENCOUNTER — Other Ambulatory Visit: Payer: Self-pay | Admitting: Family Medicine

## 2014-02-17 ENCOUNTER — Other Ambulatory Visit: Payer: Self-pay | Admitting: Family Medicine

## 2014-02-18 ENCOUNTER — Encounter: Payer: Self-pay | Admitting: Family Medicine

## 2014-02-18 ENCOUNTER — Ambulatory Visit (INDEPENDENT_AMBULATORY_CARE_PROVIDER_SITE_OTHER): Payer: Commercial Managed Care - HMO | Admitting: Family Medicine

## 2014-02-18 VITALS — BP 126/73 | HR 81 | Wt 144.1 lb

## 2014-02-18 DIAGNOSIS — I700 Atherosclerosis of aorta: Secondary | ICD-10-CM

## 2014-02-18 DIAGNOSIS — N400 Enlarged prostate without lower urinary tract symptoms: Secondary | ICD-10-CM

## 2014-02-18 DIAGNOSIS — H547 Unspecified visual loss: Secondary | ICD-10-CM | POA: Insufficient documentation

## 2014-02-18 DIAGNOSIS — G250 Essential tremor: Secondary | ICD-10-CM

## 2014-02-18 NOTE — Progress Notes (Signed)
CC: Roberto SchlatterRobert J Day is a 81 y.o. male is here for Follow-up   Subjective: HPI:  Follow-up essential hypertension: Currently taking terazosin on a daily basis but only for BPH benefit. He occasionally checks his blood pressure at home has noticed wide variations in his readings depending on what blood pressure cuff he uses. Denies chest pain shortness of breath orthopnea nor peripheral edema. Denies motor or sensory disturbances other than tremor described below.    follow-up atherosclerosis of the aorta: It's been 3 months now since he has not been taking Lipitor. Overall he does not feel any better or worse since stopping this. He wants to get some objective data on the benefits of this medication and if it was really working and providing him some benefit.  Follow-up vision loss: With the new year he is requiring a new referral to ophthalmology. He denies any progressive vision loss with respect to distance or nearsightedness.   He is no longer having any groin pain That he experienced a few months ago.   Follow-up BPH: Taking terazosin  and Flomax on a daily basis without any sensation of incomplete voiding. Denies any difficulty initiating stream. Awakening once to urinate at night. No genitourinary complaints.    complains of a tremor in both hands that has been present for matter of years ever since he moved here from [place]. It seems to be worse first thing in the morning and gets better as the day goes on. It only occurs with purposeful movements. Nothing seems to make it better or worse that he can identify. It's never present at rest he describes it as a rotating of the wrists that is involuntary   Review Of Systems Outlined In HPI  Past Medical History  Diagnosis Date  . Unintentional weight loss 10/07/2011  . BPH (benign prostatic hyperplasia) 10/07/2011  . Hematuria 10/07/2011  . Bladder stone 10/07/2011  . Aortic stenosis     and aortic insufficiency  . Hyperlipidemia   .  Bladder infection     hx of  . GERD (gastroesophageal reflux disease)     only take medication as needed  . Arthritis     "bilateral knees and right shoulder"  . Hypertension     Dr. Laren BoomSean Jeanette Moffatt, [place] primary care  . Pneumonia     "had it very frequently when I was a child" (12/05/2011)  . Nitrate poisoning 1945  . Stomach ulcer 1945    "caused by nitrate poisoning" (12/05/2011)  . Migraines     "went away inthe early 1940's" (12/05/2011)    Past Surgical History  Procedure Laterality Date  . Circumcision      1996  . Inner ear surgery  1948    "left; perforated eardrum now" (12/05/2011)  . Inguinal hernia repair  12/05/2011    bilaterally  . Tonsillectomy  1946  . Inguinal hernia repair  12/05/2011    Procedure: HERNIA REPAIR INGUINAL ADULT BILATERAL;  Surgeon: Wilmon ArmsMatthew K. Corliss Skainssuei, MD;  Location: [facility];  Service: General;  Laterality: Bilateral;  . Insertion of mesh  12/05/2011    Procedure: INSERTION OF MESH;  Surgeon: Wilmon ArmsMatthew K. Corliss Skainssuei, MD;  Location: [facility];  Service: General;  Laterality: Bilateral;   Family History  Problem Relation Age of Onset  . Heart attack Mother   . Stroke Mother     History   Social History  . Marital Status: Married    Spouse Name: N/A  . Number of Children: 3  .  Years of Education: N/A   Occupational History  . Not on file.   Social History Main Topics  . Smoking status: Former Smoker -- 0.50 packs/day for 5 years    Types: Cigarettes  . Smokeless tobacco: Never Used     Comment: 12/05/2011 "haven't smoked since age [age]"  . Alcohol Use: Yes     Comment: 12/05/2011 "very rarely have a beer"  . Drug Use: No  . Sexual Activity: No   Other Topics Concern  . Not on file   Social History Narrative     Objective: BP 126/73 mmHg  Pulse 81  Wt 144 lb 1.3 oz (65.354 kg)  SpO2 96%  General: Alert and Oriented, No Acute Distress HEENT: Pupils equal, round, reactive to light. Conjunctivae clear.  moist mucous membranes  Lungs: Clear  to auscultation bilaterally, no wheezing/ronchi/rales.  Comfortable work of breathing. Good air movement. Cardiac: Regular rate and rhythm. Normal S1/S2.grade 2/6 holosystolic murmur left second intercostal space Abdomen: Normal bowel sounds, soft and non tender without palpable masses. Neuro: Cranial nerves II through XII grossly intact. Full range of motion and strength in all 4 extremities. No cogwheeling of the elbows or wrists. Extremities: No peripheral edema.  Strong peripheral pulses.  Mental Status: No depression, anxiety, nor agitation. Skin: Warm and dry.  Assessment & Plan: Rhyland was seen today for follow-up.  Diagnoses and all orders for this visit:  Atherosclerosis of aorta Orders: -     Lipid panel  Essential tremor  Vision loss Orders: -     Ambulatory referral to Ophthalmology  BPH (benign prostatic hyperplasia)    atherosclerosis sclerosis of the aorta: Repeating lipid panel now that he has been off Lipitor   essential tremor: Reassurance provided that this does not appear to be reflective of Parkinson's disease or anything more serious than the tremor itself. He tells me that taking another medication to try to reduce the tremor would be more of a nuisance than just dealing with the tremor that is not interfering with his quality of life.   vision loss: New referral has been placed for ophthalmology BPH: Controlled  His aortic insufficiency and aortic stenosis still appears to be asymptomatic, we'll continue to treat conservatively as recommended by cardiology  I checked 3 of his blood pressure cuffs today and 2 of them grossly overestimated both his and my blood pressure. I advised him to use the oldest blood pressure cuff that was most accurate compared to our blood pressure machines. This cuff was labeled #3 for any future reference.  40 minutes spent face-to-face during visit today of which at least 50% was counseling or coordinating care regarding: 1.  Atherosclerosis of aorta   2. Essential tremor   3. Vision loss   4. BPH (benign prostatic hyperplasia)      Return in about 3 months (around 05/19/2014).

## 2014-02-28 ENCOUNTER — Telehealth: Payer: Self-pay | Admitting: Family Medicine

## 2014-02-28 ENCOUNTER — Other Ambulatory Visit: Payer: Self-pay | Admitting: *Deleted

## 2014-02-28 DIAGNOSIS — M19011 Primary osteoarthritis, right shoulder: Secondary | ICD-10-CM

## 2014-02-28 LAB — LIPID PANEL
CHOL/HDL RATIO: 3.8 ratio
CHOLESTEROL: 174 mg/dL (ref 0–200)
HDL: 46 mg/dL (ref >=40)
LDL Cholesterol: 92 mg/dL (ref 0–99)
Triglycerides: 179 mg/dL — ABNORMAL HIGH (ref <150)
VLDL: 36 mg/dL (ref 0–40)

## 2014-02-28 MED ORDER — HYDROCODONE-ACETAMINOPHEN 7.5-325 MG PO TABS: 1.0000 | ORAL_TABLET | Freq: Four times a day (QID) | ORAL | Status: DC | PRN

## 2014-02-28 MED ORDER — ATORVASTATIN CALCIUM 10 MG PO TABS: 10.0000 mg | ORAL_TABLET | Freq: Every day | ORAL | Status: DC

## 2014-02-28 NOTE — Telephone Encounter (Signed)
Sue Lushndrea, Will you please let patient or family know that his cholesterol would benefit from restarting his former regimen of atorvastatin which i've sent to [facility].  Since he has a history of cholesterol plaque in his aorta it's best to take an aggressive approach with this medication.

## 2014-02-28 NOTE — Telephone Encounter (Signed)
[name] notified

## 2014-03-03 ENCOUNTER — Other Ambulatory Visit: Payer: Self-pay | Admitting: Family Medicine

## 2014-03-12 ENCOUNTER — Other Ambulatory Visit: Payer: Self-pay | Admitting: Family Medicine

## 2014-03-14 ENCOUNTER — Telehealth: Payer: Self-pay | Admitting: *Deleted

## 2014-03-14 DIAGNOSIS — N400 Enlarged prostate without lower urinary tract symptoms: Secondary | ICD-10-CM

## 2014-03-14 NOTE — Telephone Encounter (Signed)
Pt needs a referral to [facility] to see Dr. Remer MachoNewsom. He is already a patient there

## 2014-03-26 ENCOUNTER — Telehealth: Payer: Self-pay

## 2014-03-26 NOTE — Telephone Encounter (Signed)
plkease call the patient, if he is not going to follow up with us, refills will need to come from PCP.

## 2014-03-26 NOTE — Telephone Encounter (Signed)
[facility]

## 2014-03-31 ENCOUNTER — Other Ambulatory Visit: Payer: Self-pay | Admitting: *Deleted

## 2014-03-31 ENCOUNTER — Telehealth: Payer: Self-pay | Admitting: Cardiology

## 2014-03-31 ENCOUNTER — Other Ambulatory Visit: Payer: Self-pay | Admitting: Family Medicine

## 2014-03-31 DIAGNOSIS — M19011 Primary osteoarthritis, right shoulder: Secondary | ICD-10-CM

## 2014-03-31 DIAGNOSIS — I050 Rheumatic mitral stenosis: Secondary | ICD-10-CM

## 2014-03-31 DIAGNOSIS — I351 Nonrheumatic aortic (valve) insufficiency: Secondary | ICD-10-CM

## 2014-03-31 MED ORDER — HYDROCODONE-ACETAMINOPHEN 7.5-325 MG PO TABS: 1.0000 | ORAL_TABLET | Freq: Four times a day (QID) | ORAL | Status: DC | PRN

## 2014-03-31 MED ORDER — FUROSEMIDE 20 MG PO TABS: 20.0000 mg | ORAL_TABLET | ORAL | Status: DC

## 2014-03-31 NOTE — Telephone Encounter (Signed)
Rx(s) sent to pharmacy electronically to [facility] in [place]. Spoke with Bobbe MedicoWanda Graffam - relative - and informed her that patient needs OV with Dr. Jens Somrenshaw ([place] Message sent to Dr. Ludwig Clarksrenshaw's scheduler, Ethelene BrownsAnthony, to contact patient for OV

## 2014-03-31 NOTE — Telephone Encounter (Signed)
°  1. Which medications need to be refilled? Furosemide(new rx needed)  2. Which pharmacy is medication to be sent to[facility] [place]  3. Do they need a 30 day or 90 day supply? Not specified  4. Would they like a call back once the medication has been sent to the pharmacy? yes

## 2014-04-12 ENCOUNTER — Other Ambulatory Visit: Payer: Self-pay | Admitting: Family Medicine

## 2014-04-17 ENCOUNTER — Telehealth: Payer: Self-pay | Admitting: *Deleted

## 2014-04-17 DIAGNOSIS — Z0110 Encounter for examination of ears and hearing without abnormal findings: Secondary | ICD-10-CM

## 2014-04-17 NOTE — Telephone Encounter (Signed)
Referral. Needs a referral to [facility]. Already established there.

## 2014-04-22 ENCOUNTER — Telehealth: Payer: Self-pay | Admitting: *Deleted

## 2014-04-22 ENCOUNTER — Ambulatory Visit (INDEPENDENT_AMBULATORY_CARE_PROVIDER_SITE_OTHER): Payer: Commercial Managed Care - HMO | Admitting: Family Medicine

## 2014-04-22 ENCOUNTER — Encounter: Payer: Self-pay | Admitting: Family Medicine

## 2014-04-22 VITALS — BP 116/67 | HR 80 | Temp 97.9°F | Ht 67.0 in | Wt 111.0 lb

## 2014-04-22 DIAGNOSIS — R5383 Other fatigue: Secondary | ICD-10-CM

## 2014-04-22 DIAGNOSIS — H6123 Impacted cerumen, bilateral: Secondary | ICD-10-CM

## 2014-04-22 LAB — COMPLETE METABOLIC PANEL WITH GFR
ALT: 12 U/L (ref 0–53)
AST: 17 U/L (ref 0–37)
Albumin: 3.4 g/dL — ABNORMAL LOW (ref 3.5–5.2)
Alkaline Phosphatase: 90 U/L (ref 39–117)
BILIRUBIN TOTAL: 0.5 mg/dL (ref 0.2–1.2)
BUN: 19 mg/dL (ref 6–23)
CHLORIDE: 103 meq/L (ref 96–112)
CO2: 27 mEq/L (ref 19–32)
CREATININE: 1.17 mg/dL (ref 0.50–1.35)
Calcium: 8.8 mg/dL (ref 8.4–10.5)
GFR, EST AFRICAN AMERICAN: 63 mL/min
GFR, EST NON AFRICAN AMERICAN: 55 mL/min — AB
GLUCOSE: 99 mg/dL (ref 70–99)
Potassium: 4.3 mEq/L (ref 3.5–5.3)
Sodium: 141 mEq/L (ref 135–145)
Total Protein: 5.3 g/dL — ABNORMAL LOW (ref 6.0–8.3)

## 2014-04-22 LAB — CBC
HEMATOCRIT: 41.3 % (ref 39.0–52.0)
HEMOGLOBIN: 13.3 g/dL (ref 13.0–17.0)
MCH: 29.8 pg (ref 26.0–34.0)
MCHC: 32.2 g/dL (ref 30.0–36.0)
MCV: 92.6 fL (ref 78.0–100.0)
MPV: 9.8 fL (ref 8.6–12.4)
Platelets: 186 10*3/uL (ref 150–400)
RBC: 4.46 MIL/uL (ref 4.22–5.81)
RDW: 14.1 % (ref 11.5–15.5)
WBC: 10.4 10*3/uL (ref 4.0–10.5)

## 2014-04-22 LAB — HEMOGLOBIN A1C
Hgb A1c MFr Bld: 5.8 % — ABNORMAL HIGH (ref <5.7)
MEAN PLASMA GLUCOSE: 120 mg/dL — AB (ref <117)

## 2014-04-22 NOTE — Progress Notes (Signed)
CC: Roberto Day is a 81 y.o. male is here for weak  no energy   Subjective: HPI:  Complains of fatigue and decreased "get up and go" has been present since he was recovering from pneumonia back in late 2015. He feels like he's never bounced back to his regular state of health first respect to fatigue. He reports that he sleeping great for all he knows and he feels well rested and restored after sleeping. He denies weakness but states stamina seems to be compromised. Daughter in law has noticed this at home but she believes that the onset has been more recent as opposed to months ago. Sleep seems to help some of the symptoms however he does notice he is sleeping more often than normal. He denies shortness of breath, lightheadedness, nor any new motor or sensory disturbances. There's been no change to his diet and he tells me if anything his appetite has picked up since I saw him last. Denies chest pain, limb claudication, nor any mental disturbance. No gastrointestinal complaints. Denies any genitourinary complaints   Review Of Systems Outlined In HPI  Past Medical History  Diagnosis Date  . Unintentional weight loss 10/07/2011  . BPH (benign prostatic hyperplasia) 10/07/2011  . Hematuria 10/07/2011  . Bladder stone 10/07/2011  . Aortic stenosis     and aortic insufficiency  . Hyperlipidemia   . Bladder infection     hx of  . GERD (gastroesophageal reflux disease)     only take medication as needed  . Arthritis     "bilateral knees and right shoulder"  . Hypertension     Dr. Laren BoomSean Chanee Henrickson, [place] primary [facility]  . Pneumonia     "had it very frequently when I was a child" (12/05/2011)  . Nitrate poisoning 1945  . Stomach ulcer 1945    "caused by nitrate poisoning" (12/05/2011)  . Migraines     "went away inthe early 1940's" (12/05/2011)    Past Surgical History  Procedure Laterality Date  . Circumcision      1996  . Inner ear surgery  1948    "left; perforated eardrum now"  (12/05/2011)  . Inguinal hernia repair  12/05/2011    bilaterally  . Tonsillectomy  1946  . Inguinal hernia repair  12/05/2011    Procedure: HERNIA REPAIR INGUINAL ADULT BILATERAL;  Surgeon: Wilmon ArmsMatthew K. Corliss Skainssuei, MD;  Location: [facility];  Service: General;  Laterality: Bilateral;  . Insertion of mesh  12/05/2011    Procedure: INSERTION OF MESH;  Surgeon: Wilmon ArmsMatthew K. Corliss Skainssuei, MD;  Location: [facility];  Service: General;  Laterality: Bilateral;   Family History  Problem Relation Age of Onset  . Heart attack Mother   . Stroke Mother     History   Social History  . Marital Status: Married    Spouse Name: N/A  . Number of Children: 3  . Years of Education: N/A   Occupational History  . Not on file.   Social History Main Topics  . Smoking status: Former Smoker -- 0.50 packs/Day for 5 years    Types: Cigarettes  . Smokeless tobacco: Never Used     Comment: 12/05/2011 "haven't smoked since age [age]"  . Alcohol Use: Yes     Comment: 12/05/2011 "very rarely have a beer"  . Drug Use: No  . Sexual Activity: No   Other Topics Concern  . Not on file   Social History Narrative     Objective: BP 116/67 mmHg  Pulse 80  Temp(Src) 97.9 F (36.6 C)  Ht  (1.702 m)  Wt 111 lb (50.349 kg)  BMI 17.38 kg/m2  SpO2 98%  General: Alert and Oriented, No Acute Distress HEENT: Pupils equal, round, reactive to light. Conjunctivae clear.  Moist mucous membranes are unremarkable Lungs: Clear to auscultation bilaterally, no wheezing/ronchi/rales.  Comfortable work of breathing. Good air movement. Cardiac: Regular rate and rhythm. Normal S1/S2.  No rubs or gallops. Grade 3 holosystolic murmur similar to examine the past Abdomen: Soft nontender Extremities: No peripheral edema.  Strong peripheral pulses.  Mental Status: No depression, anxiety, nor agitation. Skin: Warm and dry.  Assessment & Plan: Roberto Day was seen today for weak  no energy.  Diagnoses and all orders for this visit:  Other  fatigue Orders: -     Hemoglobin A1c -     COMPLETE METABOLIC PANEL WITH GFR -     CBC -     Urinalysis, Routine w reflex microscopic -     Urine culture   Fatigue: Rule out anemia, anabolic abnormality in UTI since he had a similar constellation of symptoms that resolved with treatment of a suspected bacterial prostatitis last year.  25 minutes spent face-to-face during visit today of which at least 50% was counseling or coordinating care regarding: 1. Other fatigue      Return if symptoms worsen or fail to improve.

## 2014-04-22 NOTE — Telephone Encounter (Signed)
Daughter called back yesterday and states pt did not need a hearing test. The referal to ENT needed to be to clean the wax out of his ears

## 2014-04-23 ENCOUNTER — Telehealth: Payer: Self-pay | Admitting: Family Medicine

## 2014-04-23 LAB — URINALYSIS, ROUTINE W REFLEX MICROSCOPIC
Bilirubin Urine: NEGATIVE
GLUCOSE, UA: NEGATIVE mg/dL
HGB URINE DIPSTICK: NEGATIVE
Ketones, ur: NEGATIVE mg/dL
LEUKOCYTES UA: NEGATIVE
Nitrite: NEGATIVE
PROTEIN: NEGATIVE mg/dL
Specific Gravity, Urine: 1.013 (ref 1.005–1.030)
Urobilinogen, UA: 0.2 mg/dL (ref 0.0–1.0)
pH: 7.5 (ref 5.0–8.0)

## 2014-04-23 MED ORDER — LEVOFLOXACIN 500 MG PO TABS: 500.0000 mg | ORAL_TABLET | Freq: Every day | ORAL | Status: DC

## 2014-04-23 NOTE — Telephone Encounter (Signed)
Sue Lushndrea, Will you please let patient or his family know that his urine studies, liver function, kidney function and blood cell counts were all normal.  His 3 month average blood sugar was just barely elevated but not near the diabetic range and not high enough to cause his fatigue.  Since his symptoms mimic how he was feeling last May I'd recommend starting an antibiotic called levofloxacin that I've sent to [facility] since it helped last year.

## 2014-04-23 NOTE — Telephone Encounter (Signed)
Spoke to patient daughter gave her results as noted below. Roberto Day,CMA  

## 2014-04-24 LAB — URINE CULTURE

## 2014-04-27 ENCOUNTER — Other Ambulatory Visit: Payer: Self-pay | Admitting: Family Medicine

## 2014-04-29 ENCOUNTER — Other Ambulatory Visit: Payer: Self-pay | Admitting: Family Medicine

## 2014-04-30 ENCOUNTER — Other Ambulatory Visit: Payer: Self-pay | Admitting: *Deleted

## 2014-04-30 DIAGNOSIS — M19011 Primary osteoarthritis, right shoulder: Secondary | ICD-10-CM

## 2014-04-30 MED ORDER — HYDROCODONE-ACETAMINOPHEN 7.5-325 MG PO TABS: 1.0000 | ORAL_TABLET | Freq: Four times a day (QID) | ORAL | Status: DC | PRN

## 2014-05-01 ENCOUNTER — Telehealth: Payer: Self-pay | Admitting: *Deleted

## 2014-05-01 DIAGNOSIS — H409 Unspecified glaucoma: Secondary | ICD-10-CM

## 2014-05-02 NOTE — Telephone Encounter (Signed)
Referral placed.

## 2014-05-05 ENCOUNTER — Encounter: Payer: Self-pay | Admitting: Family Medicine

## 2014-05-05 DIAGNOSIS — R739 Hyperglycemia, unspecified: Secondary | ICD-10-CM | POA: Insufficient documentation

## 2014-05-17 ENCOUNTER — Other Ambulatory Visit: Payer: Self-pay | Admitting: Family Medicine

## 2014-05-19 ENCOUNTER — Ambulatory Visit (INDEPENDENT_AMBULATORY_CARE_PROVIDER_SITE_OTHER): Payer: Commercial Managed Care - HMO | Admitting: Family Medicine

## 2014-05-19 ENCOUNTER — Encounter: Payer: Self-pay | Admitting: Family Medicine

## 2014-05-19 VITALS — BP 142/78 | HR 77 | Wt 149.0 lb

## 2014-05-19 DIAGNOSIS — I351 Nonrheumatic aortic (valve) insufficiency: Secondary | ICD-10-CM | POA: Diagnosis not present

## 2014-05-19 DIAGNOSIS — J8410 Pulmonary fibrosis, unspecified: Secondary | ICD-10-CM

## 2014-05-19 DIAGNOSIS — N400 Enlarged prostate without lower urinary tract symptoms: Secondary | ICD-10-CM

## 2014-05-19 DIAGNOSIS — I050 Rheumatic mitral stenosis: Secondary | ICD-10-CM | POA: Diagnosis not present

## 2014-05-19 DIAGNOSIS — IMO0001 Reserved for inherently not codable concepts without codable children: Secondary | ICD-10-CM

## 2014-05-19 DIAGNOSIS — R739 Hyperglycemia, unspecified: Secondary | ICD-10-CM

## 2014-05-19 MED ORDER — CETIRIZINE HCL 10 MG PO TABS: 10.0000 mg | ORAL_TABLET | Freq: Every day | ORAL | Status: AC

## 2014-05-19 MED ORDER — TAMSULOSIN HCL 0.4 MG PO CAPS: 0.8000 mg | ORAL_CAPSULE | Freq: Every day | ORAL | Status: DC

## 2014-05-19 MED ORDER — GABAPENTIN 300 MG PO CAPS: ORAL_CAPSULE | ORAL | Status: DC

## 2014-05-19 NOTE — Progress Notes (Signed)
CC: Roberto SchlatterRobert J Day is a 81 y.o. male is here for 3 month f/u   Subjective: HPI:  Follow-up BPH: Since I saw him last his urologist stopped terazosin since it was believed that he did not need this as he was taking Flomax. Since this happened he has had increased sensation of incomplete voiding and having to strain a little more with urinating. He denies any other genitourinary symptoms. He's only taking 1 Flomax a Day.  He will need a referral to cardiology for 2016 for management of aortic regurgitation and mitral valve stenosis. He denies any new shortness of breath and states that since he was given levofloxacin for presumed prostate infection last time I saw him he's back to his regular state of energy. Denies any orthopnea nor peripheral edema.  He needs a referral to pulmonology for 2016 for management of his pulmonary fibrosis. Urinary has an appointment for this.  Follow-up hyperglycemia: Last month A1c was normal. He denies any polyuria polyphagia polydipsia nor poorly healing wounds. He does have a cut on his right elbow that he is not sure where it came from but he notices a few days ago. He tells me it seems to be getting smaller. Interventions only include covering with a bandage. No fevers, chills, nor lymph node swelling.   Review Of Systems Outlined In HPI  Past Medical History  Diagnosis Date  . Unintentional weight loss 10/07/2011  . BPH (benign prostatic hyperplasia) 10/07/2011  . Hematuria 10/07/2011  . Bladder stone 10/07/2011  . Aortic stenosis     and aortic insufficiency  . Hyperlipidemia   . Bladder infection     hx of  . GERD (gastroesophageal reflux disease)     only take medication as needed  . Arthritis     "bilateral knees and right shoulder"  . Hypertension     Dr. Laren BoomSean Sahily Biddle, [place] primary [facility]  . Pneumonia     "had it very frequently when I was a child" (12/05/2011)  . Nitrate poisoning 1945  . Stomach ulcer 1945    "caused by nitrate  poisoning" (12/05/2011)  . Migraines     "went away inthe early 1940's" (12/05/2011)    Past Surgical History  Procedure Laterality Date  . Circumcision      1996  . Inner ear surgery  1948    "left; perforated eardrum now" (12/05/2011)  . Inguinal hernia repair  12/05/2011    bilaterally  . Tonsillectomy  1946  . Inguinal hernia repair  12/05/2011    Procedure: HERNIA REPAIR INGUINAL ADULT BILATERAL;  Surgeon: Wilmon ArmsMatthew K. Corliss Skainssuei, MD;  Location: [facility];  Service: General;  Laterality: Bilateral;  . Insertion of mesh  12/05/2011    Procedure: INSERTION OF MESH;  Surgeon: Wilmon ArmsMatthew K. Corliss Skainssuei, MD;  Location: [facility];  Service: General;  Laterality: Bilateral;   Family History  Problem Relation Age of Onset  . Heart attack Mother   . Stroke Mother     History   Social History  . Marital Status: Married    Spouse Name: N/A  . Number of Children: 3  . Years of Education: N/A   Occupational History  . Not on file.   Social History Main Topics  . Smoking status: Former Smoker -- 0.50 packs/Day for 5 years    Types: Cigarettes  . Smokeless tobacco: Never Used     Comment: 12/05/2011 "haven't smoked since age [age]"  . Alcohol Use: Yes     Comment: 12/05/2011 "very  rarely have a beer"  . Drug Use: No  . Sexual Activity: No   Other Topics Concern  . Not on file   Social History Narrative     Objective: BP 142/78 mmHg  Pulse 77  Wt 149 lb (67.586 kg)  General: Alert and Oriented, No Acute Distress HEENT: Pupils equal, round, reactive to light. Conjunctivae clear.  Moist mucous membranes pharynx unremarkable. Moderate nasal congestion clear Lungs: Clear to auscultation bilaterally, no wheezing/ronchi/rales.  Comfortable work of breathing. Good air movement. Cardiac: Regular rate and rhythm. Normal S1/S2. Grade 2/6 diastolic murmur unchanged from prior exams. Extremities: No peripheral edema.  Strong peripheral pulses.  Mental Status: No depression, anxiety, nor agitation. Skin: Warm  and dry.  Assessment & Plan: Roberto MaduroRobert was seen today for 3 month f/u.  Diagnoses and all orders for this visit:  BPH (benign prostatic hyperplasia)  Aortic regurgitation  Hyperglycemia  Cut  Mitral valve stenosis Orders: -     Ambulatory referral to Cardiology  Pulmonary fibrosis Orders: -     Ambulatory referral to Pulmonology  Other orders -     tamsulosin (FLOMAX) 0.4 MG CAPS capsule; Take 2 capsules (0.8 mg total) by mouth daily. -     cetirizine (ZYRTEC) 10 MG tablet; Take 1 tablet (10 mg total) by mouth daily. -     gabapentin (NEURONTIN) 300 MG capsule; TAKE ONE CAPSULE BY MOUTH 4 TIMES DAILY   BPH: Uncontrolled increasing Flomax to max dosage Aortic regurgitation and mitral valve stenosis: Currently controlled, follow-up already arranged for cardiology Hyperglycemia: Clinical control no repeat A1c needed until 2 months from now Pulmonary fibrosis: Clinically stable referral made to pulmonology Advised to start Zyrtec as needed for nasal congestion is bothering him A scab on the right elbow was removed by accident while patient's blood pressure was being taken. Hemostasis easily achieved with just a Band-Aid.  Return in about 3 months (around 08/19/2014).

## 2014-05-20 NOTE — Progress Notes (Signed)
HPI: FU aortic insufficiency. Echocardiogram in November 2014 showed normal LV function. There was mild aortic stenosis and moderate aortic insufficiency. There was mild to moderate mitral stenosis. Since last seen, he has mild dyspnea but has limited mobility. No orthopnea, PND or chest pain. He has recently developed about pedal edema.  Current Outpatient Prescriptions  Medication Sig Dispense Refill  . atorvastatin (LIPITOR) 10 MG tablet Take 1 tablet (10 mg total) by mouth daily. 90 tablet 3  . betaxolol (BETOPTIC-S) 0.25 % ophthalmic suspension Place 1 drop into both eyes 2 (two) times daily. 5 mL 3  . cetirizine (ZYRTEC) 10 MG tablet Take 1 tablet (10 mg total) by mouth daily. 30 tablet 11  . Docusate Sodium (COLACE PO) Take by mouth.    . furosemide (LASIX) 20 MG tablet Take 1 tablet (20 mg total) by mouth every other day. 30 tablet 0  . gabapentin (NEURONTIN) 300 MG capsule TAKE ONE CAPSULE BY MOUTH 4 TIMES DAILY 120 capsule 5  . HYDROcodone-acetaminophen (NORCO) 7.5-325 MG per tablet Take 1 tablet by mouth every 6 (six) hours as needed. 120 tablet 0  . Multiple Vitamins-Minerals (CENTRUM SILVER PO) Take 1 tablet by mouth daily.     . Multiple Vitamins-Minerals (PRESERVISION AREDS PO) Take by mouth 2 (two) times daily.    . Potassium Gluconate 550 MG TABS Take 1 tablet (550 mg total) by mouth daily. 90 each 1  . ranitidine (ZANTAC) 150 MG tablet TAKE ONE TABLET BY MOUTH TWICE DAILY 60 tablet 11  . Saw Palmetto, Serenoa repens, (SAW PALMETTO PO) Take 1 tablet by mouth daily.     . tamsulosin (FLOMAX) 0.4 MG CAPS capsule Take 2 capsules (0.8 mg total) by mouth daily. 180 capsule 1  . traMADol (ULTRAM) 50 MG tablet TAKE ONE TABLET BY MOUTH EVERY 8 HOURS AS NEEDED FOR PAIN 90 tablet 0   No current facility-administered medications for this visit.     Past Medical History  Diagnosis Date  . Unintentional weight loss 10/07/2011  . BPH (benign prostatic hyperplasia) 10/07/2011  .  Hematuria 10/07/2011  . Bladder stone 10/07/2011  . Aortic stenosis     and aortic insufficiency  . Hyperlipidemia   . Bladder infection     hx of  . GERD (gastroesophageal reflux disease)     only take medication as needed  . Arthritis     "bilateral knees and right shoulder"  . Hypertension     Dr. Laren BoomSean Hommel, [facility]  . Pneumonia     "had it very frequently when I was a child" (12/05/2011)  . Nitrate poisoning 1945  . Stomach ulcer 1945    "caused by nitrate poisoning" (12/05/2011)  . Migraines     "went away inthe early 1940's" (12/05/2011)    Past Surgical History  Procedure Laterality Date  . Circumcision      1996  . Inner ear surgery  1948    "left; perforated eardrum now" (12/05/2011)  . Inguinal hernia repair  12/05/2011    bilaterally  . Tonsillectomy  1946  . Inguinal hernia repair  12/05/2011    Procedure: HERNIA REPAIR INGUINAL ADULT BILATERAL;  Surgeon: Wilmon ArmsMatthew K. Corliss Skainssuei, MD;  Location: [facility] OR;  Service: General;  Laterality: Bilateral;  . Insertion of mesh  12/05/2011    Procedure: INSERTION OF MESH;  Surgeon: Wilmon ArmsMatthew K. Corliss Skainssuei, MD;  Location: [facility] OR;  Service: General;  Laterality: Bilateral;    History   Social History  .  Marital Status: Married    Spouse Name: N/A  . Number of Children: 3  . Years of Education: N/A   Occupational History  . Not on file.   Social History Main Topics  . Smoking status: Former Smoker -- 0.50 packs/day for 5 years    Types: Cigarettes  . Smokeless tobacco: Never Used     Comment: 12/05/2011 "haven't smoked since age [age]"  . Alcohol Use: Yes     Comment: 12/05/2011 "very rarely have a beer"  . Drug Use: No  . Sexual Activity: No   Other Topics Concern  . Not on file   Social History Narrative    ROS: no fevers or chills, productive cough, hemoptysis, dysphasia, odynophagia, melena, hematochezia, dysuria, hematuria, rash, seizure activity, orthopnea, PND, claudication. Remaining systems are  negative.  Physical Exam: Well-developed well-nourished in no acute distress.  Skin is warm and dry.  HEENT is normal.  Neck is supple.  Chest is clear to auscultation with normal expansion.  Cardiovascular exam is regular rate and rhythm. 2/6 systolic murmur left sternal border. 1/6 diastolic murmur. Abdominal exam nontender or distended. No masses palpated. Extremities show 1+ ankle edema. neuro grossly intact  ECG sinus rhythm with occasional PACs, left anterior fascicular block, no ST changes.

## 2014-05-21 ENCOUNTER — Encounter: Payer: Self-pay | Admitting: Cardiology

## 2014-05-21 ENCOUNTER — Ambulatory Visit (INDEPENDENT_AMBULATORY_CARE_PROVIDER_SITE_OTHER): Payer: Commercial Managed Care - HMO | Admitting: Cardiology

## 2014-05-21 VITALS — BP 130/72 | HR 62 | Ht 67.0 in | Wt 150.0 lb

## 2014-05-21 DIAGNOSIS — R0600 Dyspnea, unspecified: Secondary | ICD-10-CM

## 2014-05-21 DIAGNOSIS — I050 Rheumatic mitral stenosis: Secondary | ICD-10-CM

## 2014-05-21 DIAGNOSIS — E785 Hyperlipidemia, unspecified: Secondary | ICD-10-CM | POA: Diagnosis not present

## 2014-05-21 DIAGNOSIS — I351 Nonrheumatic aortic (valve) insufficiency: Secondary | ICD-10-CM

## 2014-05-21 MED ORDER — FUROSEMIDE 20 MG PO TABS: 20.0000 mg | ORAL_TABLET | Freq: Every day | ORAL | Status: DC

## 2014-05-21 NOTE — Assessment & Plan Note (Signed)
Continue statin. 

## 2014-05-21 NOTE — Patient Instructions (Signed)
Your physician recommends that you schedule a follow-up appointment in: 3 MONTHS WITH DR CRENSHAW  INCREASE FUROSEMIDE TO 20 MG ONCE DAILY  Your physician has requested that you have an echocardiogram. Echocardiography is a painless test that uses sound waves to create images of your heart. It provides your doctor with information about the size and shape of your heart and how well your heart's chambers and valves are working. This procedure takes approximately one hour. There are no restrictions for this procedure.   Your physician recommends that you return for lab work in: ONE WEEK

## 2014-05-21 NOTE — Assessment & Plan Note (Signed)
Patient with history of aortic stenosis, aortic insufficiency and mitral stenosis. He has mild excess volume on examination. Change Lasix to 20 mg daily. Check potassium and renal function in 1 week. Repeat echocardiogram. I'm not convinced he would be a good candidate to pursue TAVR in the future given age. He would consider procedures if we thought he was a candidate. I will see him back in 3 months to make sure his symptoms are stable.

## 2014-05-26 ENCOUNTER — Ambulatory Visit (HOSPITAL_COMMUNITY)
Admission: RE | Admit: 2014-05-26 | Discharge: 2014-05-26 | Disposition: A | Discharge status: Home / Self Care | Payer: Commercial Managed Care - HMO | Source: Ambulatory Visit | Attending: Internal Medicine | Admitting: Internal Medicine

## 2014-05-26 DIAGNOSIS — I351 Nonrheumatic aortic (valve) insufficiency: Secondary | ICD-10-CM

## 2014-05-26 DIAGNOSIS — I350 Nonrheumatic aortic (valve) stenosis: Secondary | ICD-10-CM | POA: Insufficient documentation

## 2014-05-26 DIAGNOSIS — I050 Rheumatic mitral stenosis: Secondary | ICD-10-CM | POA: Diagnosis not present

## 2014-05-26 DIAGNOSIS — R0600 Dyspnea, unspecified: Secondary | ICD-10-CM | POA: Diagnosis present

## 2014-05-30 ENCOUNTER — Other Ambulatory Visit: Payer: Self-pay | Admitting: *Deleted

## 2014-05-30 DIAGNOSIS — M19011 Primary osteoarthritis, right shoulder: Secondary | ICD-10-CM

## 2014-05-30 MED ORDER — HYDROCODONE-ACETAMINOPHEN 7.5-325 MG PO TABS: 1.0000 | ORAL_TABLET | Freq: Four times a day (QID) | ORAL | Status: DC | PRN

## 2014-05-31 LAB — BASIC METABOLIC PANEL WITH GFR
BUN: 20 mg/dL (ref 6–23)
CO2: 29 mEq/L (ref 19–32)
CREATININE: 1.25 mg/dL (ref 0.50–1.35)
Calcium: 9.1 mg/dL (ref 8.4–10.5)
Chloride: 104 mEq/L (ref 96–112)
GFR, Est African American: 58 mL/min — ABNORMAL LOW
GFR, Est Non African American: 50 mL/min — ABNORMAL LOW
Glucose, Bld: 98 mg/dL (ref 70–99)
Potassium: 4.5 mEq/L (ref 3.5–5.3)
SODIUM: 141 meq/L (ref 135–145)

## 2014-06-11 ENCOUNTER — Other Ambulatory Visit: Payer: Self-pay | Admitting: Family Medicine

## 2014-06-12 ENCOUNTER — Ambulatory Visit (INDEPENDENT_AMBULATORY_CARE_PROVIDER_SITE_OTHER): Payer: Commercial Managed Care - HMO | Admitting: Family Medicine

## 2014-06-12 ENCOUNTER — Encounter: Payer: Self-pay | Admitting: Family Medicine

## 2014-06-12 ENCOUNTER — Telehealth: Payer: Self-pay | Admitting: *Deleted

## 2014-06-12 VITALS — BP 115/69 | HR 77 | Wt 155.0 lb

## 2014-06-12 DIAGNOSIS — R609 Edema, unspecified: Secondary | ICD-10-CM | POA: Diagnosis not present

## 2014-06-12 DIAGNOSIS — M25519 Pain in unspecified shoulder: Secondary | ICD-10-CM

## 2014-06-12 DIAGNOSIS — I351 Nonrheumatic aortic (valve) insufficiency: Secondary | ICD-10-CM

## 2014-06-12 DIAGNOSIS — I050 Rheumatic mitral stenosis: Secondary | ICD-10-CM

## 2014-06-12 MED ORDER — FUROSEMIDE 20 MG PO TABS: ORAL_TABLET | ORAL | Status: DC

## 2014-06-12 NOTE — Telephone Encounter (Signed)
referral

## 2014-06-12 NOTE — Progress Notes (Signed)
CC: Roberto Day is a 81 y.o. male is here for Leg Swelling   Subjective: HPI:  Patient reports that on the 18th of last month he was started on Lasix 20 mg daily. This was started for some fluid retention showing up in his legs only. He is daughter stated this medication doesn't seem to be working and there's been no change in his edema. It seems to be persistent, they've not elevated his Lasix for more than a few minutes to see if helps. His legs are usually dependent majority of the day. Swelling has always been asymmetric with the right leg being greater than the left. Both of them agree that the swelling has not gotten any worse since last month. He denies any orthopnea or edema elsewhere. Denies any new shortness of breath. No overlying skin changes. He denies any rapid heartbeat or chest discomfort.     Review Of Systems Outlined In HPI  Past Medical History  Diagnosis Date  . Unintentional weight loss 10/07/2011  . BPH (benign prostatic hyperplasia) 10/07/2011  . Hematuria 10/07/2011  . Bladder stone 10/07/2011  . Aortic stenosis     and aortic insufficiency  . Hyperlipidemia   . Bladder infection     hx of  . GERD (gastroesophageal reflux disease)     only take medication as needed  . Arthritis     "bilateral knees and right shoulder"  . Hypertension     Dr. Laren Boom, [place] [facility]  . Pneumonia     "had it very frequently when I was a child" (12/05/2011)  . Nitrate poisoning 1945  . Stomach ulcer 1945    "caused by nitrate poisoning" (12/05/2011)  . Migraines     "went away inthe early 1940's" (12/05/2011)    Past Surgical History  Procedure Laterality Date  . Circumcision      1996  . Inner ear surgery  1948    "left; perforated eardrum now" (12/05/2011)  . Inguinal hernia repair  12/05/2011    bilaterally  . Tonsillectomy  1946  . Inguinal hernia repair  12/05/2011    Procedure: HERNIA REPAIR INGUINAL ADULT BILATERAL;  Surgeon: [name]. [name], MD;   Location: [facility];  Service: General;  Laterality: Bilateral;  . Insertion of mesh  12/05/2011    Procedure: INSERTION OF MESH;  Surgeon: [name]. [name], MD;  Location: [facility] OR;  Service: General;  Laterality: Bilateral;   Family History  Problem Relation Age of Onset  . Heart attack Mother   . Stroke Mother     History   Social History  . Marital Status: Married    Spouse Name: N/A  . Number of Children: 3  . Years of Education: N/A   Occupational History  . Not on file.   Social History Main Topics  . Smoking status: Former Smoker -- 0.50 packs/day for 5 years    Types: Cigarettes  . Smokeless tobacco: Never Used     Comment: 12/05/2011 "haven't smoked since age [age]"  . Alcohol Use: Yes     Comment: 12/05/2011 "very rarely have a beer"  . Drug Use: No  . Sexual Activity: No   Other Topics Concern  . Not on file   Social History Narrative     Objective: BP 115/69 mmHg  Pulse 77  Wt 155 lb (70.308 kg)  General: Alert and Oriented, No Acute Distress HEENT: Pupils equal, round, reactive to light. Conjunctivae clear.  Moist mucous membranes Lungs: Clear to  auscultation bilaterally, no wheezing/ronchi/rales.  Comfortable work of breathing. Good air movement. Cardiac: Regular rate and rhythm. Grade 3/6 systolic murmur left second intercostal  No rubs, nor gallops.   Extremities: 1+ pitting edema in both lower external distributions distally with right greater than left..  Strong peripheral pulses.  Mental Status: No depression, anxiety, nor agitation. Skin: Warm and dry.  Assessment & Plan: Roberto Day was seen today for leg swelling.  Diagnoses and all orders for this visit:  Aortic regurgitation Orders: -     furosemide (LASIX) 20 MG tablet; Two by mouth every morning and one additional tab in the afternoon as needed for swelling. -     BASIC METABOLIC PANEL WITH GFR  Mitral valve stenosis Orders: -     furosemide (LASIX) 20 MG tablet; Two by mouth every morning and  one additional tab in the afternoon as needed for swelling.  Edema Orders: -     BASIC METABOLIC PANEL WITH GFR   Aortic regurgitation appears to be stable and although stenosis seems to be increased from mild to moderate he does not seem to be symptomatically from a pulmonary standpoint. To address his edema Lasix will be increased to the above regimen with a repeat basic metabolic panel in 1 week. I will try to forward these results as an update to his cardiologist  Return in about 1 week (around 06/19/2014) for Basic metabolic panel.

## 2014-06-20 LAB — BASIC METABOLIC PANEL WITH GFR
BUN: 23 mg/dL (ref 6–23)
CO2: 32 mEq/L (ref 19–32)
Calcium: 8.9 mg/dL (ref 8.4–10.5)
Chloride: 100 mEq/L (ref 96–112)
Creat: 1.37 mg/dL — ABNORMAL HIGH (ref 0.50–1.35)
GFR, Est African American: 52 mL/min — ABNORMAL LOW
GFR, Est Non African American: 45 mL/min — ABNORMAL LOW
GLUCOSE: 128 mg/dL — AB (ref 70–99)
POTASSIUM: 4.1 meq/L (ref 3.5–5.3)
Sodium: 142 mEq/L (ref 135–145)

## 2014-06-24 ENCOUNTER — Telehealth: Payer: Self-pay | Admitting: *Deleted

## 2014-06-24 DIAGNOSIS — M19011 Primary osteoarthritis, right shoulder: Secondary | ICD-10-CM

## 2014-06-24 MED ORDER — HYDROCODONE-ACETAMINOPHEN 7.5-325 MG PO TABS: 1.0000 | ORAL_TABLET | Freq: Four times a day (QID) | ORAL | Status: DC | PRN

## 2014-06-24 NOTE — Telephone Encounter (Signed)
That is perfectly fine for this particular family, prescription will be in in box

## 2014-06-24 NOTE — Telephone Encounter (Signed)
Daughter in law wants to pick up vicodin rx on Friday because she is going out of town. rx is not due until next monday

## 2014-06-25 NOTE — Telephone Encounter (Signed)
rx up at the front and left message on vm

## 2014-07-15 ENCOUNTER — Telehealth: Payer: Self-pay

## 2014-07-15 DIAGNOSIS — M170 Bilateral primary osteoarthritis of knee: Secondary | ICD-10-CM

## 2014-07-15 NOTE — Telephone Encounter (Signed)
Pt daughter was in office and stated that the referral to ortho was only placed for shoulders and needed to be for knees. This is a issue because of insurance. She would like a new referral that includes the knees. Thank you.

## 2014-07-15 NOTE — Telephone Encounter (Signed)
Sue LushAndrea, Referral placed

## 2014-07-24 ENCOUNTER — Encounter: Payer: Self-pay | Admitting: Adult Health

## 2014-07-24 ENCOUNTER — Ambulatory Visit (INDEPENDENT_AMBULATORY_CARE_PROVIDER_SITE_OTHER): Payer: Commercial Managed Care - HMO | Admitting: Adult Health

## 2014-07-24 VITALS — BP 110/75 | HR 74 | Temp 98.1°F | Ht 69.0 in | Wt 151.0 lb

## 2014-07-24 DIAGNOSIS — J8410 Pulmonary fibrosis, unspecified: Secondary | ICD-10-CM

## 2014-07-24 DIAGNOSIS — J309 Allergic rhinitis, unspecified: Secondary | ICD-10-CM | POA: Insufficient documentation

## 2014-07-24 NOTE — Patient Instructions (Signed)
Continue on current regimen  Saline nasal rinses As needed   follow up Dr. Vassie Loll  In 6 months and As needed

## 2014-07-24 NOTE — Assessment & Plan Note (Signed)
Stable without flare   Plan  Continue on current regimen  Saline nasal rinses As needed   follow up Dr. Vassie Loll  In 6 months and As needed

## 2014-07-24 NOTE — Progress Notes (Signed)
   Subjective:    Patient ID: Roberto Dupes., male    DOB: 1921/03/25, 81 y.o.   MRN: [id number]  HPI  81 y.o remote smoker (quit in 20's) for FU of RML pulmonary nodule & ILD  He is wheelchair bound due to bad knees.  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 There was mild aortic stenosis and moderate aortic insufficiency. There was mild to moderate mitral stenosis, nml RVSF . He has 3/ 6 ESM at base   CT abdomen for renal stone in 10/2011 had shown ILD & RLL pleural based 3 mm nodule  CT chest 11/2012 showed underlying interstitial lung Disease. There was a a 1.1 x 0.7 x 2.0 cm elongated nodular opacity in the posterior aspect of the right middle lobe abutting the major fissure. There were ill-defined nodular areas of ground-glass attenuation bilaterally in a predominantly upper lung and peribronchovascular distribution, suggestive of an active atypical infectious process.   05/08/2013 CT chest - RML pulmonary nodule previously noted along the right major fissure has resolved. RUL nodule has increased slightly in size. Interim improvement in nodular ill-defined infiltrates . - mild increase in peribronchial interstitial changes noted throughout both lungs Large sliding hiatal hernia. ? component of aspiration pneumonitis    11/21/2013  Chief Complaint  Patient presents with  . Follow-up    sinus drainage, coughing up white, light yellow mucus   Accompanied by daughter-in-law Dyspnea is difficult to estimate due to his limited mobility, he reports class 2 He denies heartburn or dysphagia Breathing is at baseline -no cough, wheeze, ambulates with walker, limited by bad knees CT 11/2013 chronic pulmonary scarring changes with new areas of inflammation most notable in the right upper lobe posteriorly, RLL nodule  faint -improved  07/24/2014 Follow Up : Pulmonary Fibrosis  Pt returns for 6 month follow up for pulmonary fibrosis .  Says since last ov breathing has been stable  No flare of cough, dypsnea .  Activity level is at baseline. He has chronic knee pain so limits him the most.  Last CT chest 11/2013 with chronic changes , lower lobe bronchiectasis/scarring. RLL nodule faint -improved.  Does complain of drippy nose esp in am . Taking zyrtec with some help.   Denies chest pain, orthopnea , increased edema or fever.   Review of Systems neg for any significant sore throat, dysphagia, itching, sneezing, nasal congestion or excess/ purulent secretions, fever, chills, sweats, unintended wt loss, pleuritic or exertional cp, hempoptysis, orthopnea pnd or change in chronic leg swelling. Also denies presyncope, palpitations, heartburn, abdominal pain, nausea, vomiting, diarrhea or change in bowel or urinary habits, dysuria,hematuria, rash, arthralgias, visual complaints, headache, numbness weakness or ataxia.     Objective:   Physical Exam  Gen. Pleasant, frail and elderly in wc , in no distress ENT - no lesions  Neck: No JVD, no thyromegaly, no carotid bruits Lungs: no use of accessory muscles, no dullness to percussion, bibasal rales, no rhonchi  Cardiovascular: Rhythm regular, heart sounds  normal, gr 1/6 SM , tr  peripheral edema Musculoskeletal: No deformities, no cyanosis or clubbing        Assessment & Plan:

## 2014-07-24 NOTE — Assessment & Plan Note (Signed)
AR   Cont zytec  Add saline nasal rinses As needed

## 2014-07-24 NOTE — Progress Notes (Signed)
Reviewed & agree with plan  

## 2014-07-25 ENCOUNTER — Other Ambulatory Visit: Payer: Self-pay | Admitting: Family Medicine

## 2014-07-30 ENCOUNTER — Other Ambulatory Visit: Payer: Self-pay | Admitting: *Deleted

## 2014-07-30 DIAGNOSIS — M19011 Primary osteoarthritis, right shoulder: Secondary | ICD-10-CM

## 2014-07-30 MED ORDER — HYDROCODONE-ACETAMINOPHEN 7.5-325 MG PO TABS: 1.0000 | ORAL_TABLET | Freq: Four times a day (QID) | ORAL | Status: DC | PRN

## 2014-07-31 ENCOUNTER — Ambulatory Visit: Payer: Commercial Managed Care - HMO | Admitting: Pulmonary Disease

## 2014-08-18 NOTE — Progress Notes (Signed)
HPI: FU aortic insufficiency. Echocardiogram repeated May 2016. Ejection fraction was 65-70%. There was moderate aortic stenosis (mean gradient 25 mmHg) and mild to moderate aortic insufficiency. There was moderate mitral stenosis (MVA 1.49 cm2) and mild mitral regurgitation. There was mild left atrial enlargement and mildly elevated pulmonary pressures. Since last seen, he denies dyspnea, chest pain, palpitations or syncope. He does have some pedal edema. He has changed his Lasix from 40 mg in the morning and 20 in the evening to 40 mg in the morning alone. He feels he is urinating too much and it is keeping him awake.  Current Outpatient Prescriptions  Medication Sig Dispense Refill  . atorvastatin (LIPITOR) 10 MG tablet Take 1 tablet (10 mg total) by mouth daily. 90 tablet 3  . betaxolol (BETOPTIC-S) 0.25 % ophthalmic suspension Place 1 drop into both eyes 2 (two) times daily. 5 mL 3  . cetirizine (ZYRTEC) 10 MG tablet Take 1 tablet (10 mg total) by mouth daily. 30 tablet 11  . Docusate Sodium (COLACE PO) Take 1 capsule by mouth daily.     . furosemide (LASIX) 20 MG tablet Take 40 mg by mouth daily. May take 1 extra tablet daily as needed for swelling    . gabapentin (NEURONTIN) 300 MG capsule TAKE ONE CAPSULE BY MOUTH 4 TIMES DAILY 120 capsule 5  . HYDROcodone-acetaminophen (NORCO) 7.5-325 MG per tablet Take 1 tablet by mouth every 6 (six) hours as needed. 120 tablet 0  . Multiple Vitamins-Minerals (CENTRUM SILVER PO) Take 1 tablet by mouth daily.     . Multiple Vitamins-Minerals (PRESERVISION AREDS PO) Take 1 capsule by mouth 2 (two) times daily.     . Potassium Gluconate 550 MG TABS Take 1 tablet (550 mg total) by mouth daily. 90 each 1  . ranitidine (ZANTAC) 150 MG tablet TAKE ONE TABLET BY MOUTH TWICE DAILY 60 tablet 11  . Saw Palmetto, Serenoa repens, (SAW PALMETTO PO) Take 1 tablet by mouth daily.     . tamsulosin (FLOMAX) 0.4 MG CAPS capsule Take 2 capsules (0.8 mg total) by  mouth daily. 180 capsule 1  . traMADol (ULTRAM) 50 MG tablet TAKE ONE TABLET BY MOUTH EVERY 8 HOURS AS NEEDED (Patient taking differently: TAKE ONE TABLET BY MOUTH EVERY 8 HOURS AS NEEDED FOR PAIN) 90 tablet 0   No current facility-administered medications for this visit.     Past Medical History  Diagnosis Date  . Unintentional weight loss 10/07/2011  . BPH (benign prostatic hyperplasia) 10/07/2011  . Hematuria 10/07/2011  . Bladder stone 10/07/2011  . Aortic stenosis     and aortic insufficiency  . Hyperlipidemia   . Bladder infection     hx of  . GERD (gastroesophageal reflux disease)     only take medication as needed  . Arthritis     "bilateral knees and right shoulder"  . Hypertension     Dr. Laren Boom, [place] [facility]  . Pneumonia     "had it very frequently when I was a child" (12/05/2011)  . Nitrate poisoning 1945  . Stomach ulcer 1945    "caused by nitrate poisoning" (12/05/2011)  . Migraines     "went away inthe early 1940's" (12/05/2011)    Past Surgical History  Procedure Laterality Date  . Circumcision      1996  . Inner ear surgery  1948    "left; perforated eardrum now" (12/05/2011)  . Inguinal hernia repair  12/05/2011    bilaterally  .  Tonsillectomy  1946  . Inguinal hernia repair  12/05/2011    Procedure: HERNIA REPAIR INGUINAL ADULT BILATERAL;  Surgeon: [name]. [name], MD;  Location: [facility] OR;  Service: General;  Laterality: Bilateral;  . Insertion of mesh  12/05/2011    Procedure: INSERTION OF MESH;  Surgeon: [name]. [name], MD;  Location: [facility] OR;  Service: General;  Laterality: Bilateral;    Social History   Social History  . Marital Status: Married    Spouse Name: N/A  . Number of Children: 3  . Years of Education: N/A   Occupational History  . Not on file.   Social History Main Topics  . Smoking status: Former Smoker -- 0.50 packs/day for 5 years    Types: Cigarettes  . Smokeless tobacco: Never Used     Comment: 12/05/2011 "haven't  smoked since age [age]"  . Alcohol Use: Yes     Comment: 12/05/2011 "very rarely have a beer"  . Drug Use: No  . Sexual Activity: No   Other Topics Concern  . Not on file   Social History Narrative    ROS: no fevers or chills, productive cough, hemoptysis, dysphasia, odynophagia, melena, hematochezia, dysuria, hematuria, rash, seizure activity, orthopnea, PND, pedal edema, claudication. Remaining systems are negative.  Physical Exam: Well-developed frail in no acute distress.  Skin is warm and dry.  HEENT is normal.  Neck is supple.  Chest is clear to auscultation with normal expansion.  Cardiovascular exam is regular rate and rhythm. 2/ 6 systolic murmur left sternal border. Abdominal exam nontender or distended. No masses palpated. Extremities show 1+ edema. neuro grossly intact

## 2014-08-20 ENCOUNTER — Encounter: Payer: Self-pay | Admitting: Cardiology

## 2014-08-20 ENCOUNTER — Ambulatory Visit (INDEPENDENT_AMBULATORY_CARE_PROVIDER_SITE_OTHER): Payer: Commercial Managed Care - HMO | Admitting: Cardiology

## 2014-08-20 VITALS — BP 130/68 | HR 78 | Ht 67.0 in | Wt 152.1 lb

## 2014-08-20 DIAGNOSIS — E785 Hyperlipidemia, unspecified: Secondary | ICD-10-CM | POA: Diagnosis not present

## 2014-08-20 DIAGNOSIS — N183 Chronic kidney disease, stage 3 unspecified: Secondary | ICD-10-CM

## 2014-08-20 DIAGNOSIS — I351 Nonrheumatic aortic (valve) insufficiency: Secondary | ICD-10-CM

## 2014-08-20 MED ORDER — FUROSEMIDE 20 MG PO TABS: 60.0000 mg | ORAL_TABLET | Freq: Every day | ORAL | Status: DC

## 2014-08-20 NOTE — Patient Instructions (Signed)
Your physician wants you to follow-up in: 6 MONTHS WITH DR Jens Som You will receive a reminder letter in the mail two months in advance. If you don't receive a letter, please call our office to schedule the follow-up appointment.   INCREASE FUROSEMIDE TO 60 MG EVERY MORNING= 3 OF THE 20 MG TABLETS EVERY MORNING

## 2014-08-20 NOTE — Assessment & Plan Note (Signed)
Continue statin. 

## 2014-08-20 NOTE — Assessment & Plan Note (Signed)
Patient has aortic stenosis, aortic insufficiency and mitral stenosis. I doubt he would be a good surgical candidate. Plan medical therapy. Change Lasix to 60 mg every morning. Check potassium and renal function.

## 2014-08-21 ENCOUNTER — Ambulatory Visit (INDEPENDENT_AMBULATORY_CARE_PROVIDER_SITE_OTHER): Payer: Commercial Managed Care - HMO

## 2014-08-21 ENCOUNTER — Encounter: Payer: Self-pay | Admitting: Family Medicine

## 2014-08-21 ENCOUNTER — Other Ambulatory Visit: Payer: Self-pay | Admitting: Family Medicine

## 2014-08-21 ENCOUNTER — Ambulatory Visit (INDEPENDENT_AMBULATORY_CARE_PROVIDER_SITE_OTHER): Payer: Commercial Managed Care - HMO | Admitting: Family Medicine

## 2014-08-21 VITALS — BP 116/73 | HR 90 | Wt 151.0 lb

## 2014-08-21 DIAGNOSIS — R251 Tremor, unspecified: Secondary | ICD-10-CM

## 2014-08-21 DIAGNOSIS — M545 Low back pain: Secondary | ICD-10-CM

## 2014-08-21 DIAGNOSIS — I700 Atherosclerosis of aorta: Secondary | ICD-10-CM

## 2014-08-21 DIAGNOSIS — N183 Chronic kidney disease, stage 3 unspecified: Secondary | ICD-10-CM

## 2014-08-21 LAB — BASIC METABOLIC PANEL
BUN: 27 mg/dL — ABNORMAL HIGH (ref 7–25)
CO2: 31 mmol/L (ref 20–31)
Calcium: 9 mg/dL (ref 8.6–10.3)
Chloride: 102 mmol/L (ref 98–110)
Creat: 1.4 mg/dL — ABNORMAL HIGH (ref 0.70–1.11)
Glucose, Bld: 87 mg/dL (ref 65–99)
POTASSIUM: 4.4 mmol/L (ref 3.5–5.3)
Sodium: 141 mmol/L (ref 135–146)

## 2014-08-21 MED ORDER — PRIMIDONE 50 MG PO TABS: 25.0000 mg | ORAL_TABLET | Freq: Every day | ORAL | Status: DC

## 2014-08-21 NOTE — Progress Notes (Signed)
CC: Roberto Day is a 81 y.o. male is here for 3 month f/u   Subjective: HPI:  Follow-up chronic kidney disease: He's been taking 60 units of Lasix on a daily basis, every morning. He had a renal function metabolic panel checked 2 days ago which showed no significant decline in his chronic kidney disease. He is not taking any anti-inflammatory medications.  Follow-up atherosclerosis: He continues to take atorvastatin without right upper quadrant pain or myalgia. He denies any leg claudication. Denies any chest pain.  This morning he fell backwards on his bed and was clutching his walker, the walker went up into the air and landed on top of him. Since then he's had midline back pain low in the lumbar region. It's worse with rotating or actively getting up or actively sitting down. He denies any motor or sensory disturbances in the lower extremities nor incontinence.  His major concern today is a tremor that is noticed in both hands at rest. He thinks it's getting worse compared to when he mentioned to me last. It's never with intentional movements. He denies a tremor elsewhere. Nothing seems to make tremor better or worse. He thinks that gabapentin was supposed to help with this when it was prescribed in the distant past. Other than a fall this morning there has been no other falls or close calls since I saw him last. He used to have a shuffling gait however this is now absent per the family since he had his knees injected with cortisone and the knee pain has resolved. Family denies any change in facial expressions. He denies any trouble with ADLs because of the tremor.    Review Of Systems Outlined In HPI  Past Medical History  Diagnosis Date  . Unintentional weight loss 10/07/2011  . BPH (benign prostatic hyperplasia) 10/07/2011  . Hematuria 10/07/2011  . Bladder stone 10/07/2011  . Aortic stenosis     and aortic insufficiency  . Hyperlipidemia   . Bladder infection     hx of  . GERD  (gastroesophageal reflux disease)     only take medication as needed  . Arthritis     "bilateral knees and right shoulder"  . Hypertension     Dr. Laren Boom, [place] [facility]  . Pneumonia     "had it very frequently when I was a child" (12/05/2011)  . Nitrate poisoning 1945  . Stomach ulcer 1945    "caused by nitrate poisoning" (12/05/2011)  . Migraines     "went away inthe early 1940's" (12/05/2011)    Past Surgical History  Procedure Laterality Date  . Circumcision      1996  . Inner ear surgery  1948    "left; perforated eardrum now" (12/05/2011)  . Inguinal hernia repair  12/05/2011    bilaterally  . Tonsillectomy  1946  . Inguinal hernia repair  12/05/2011    Procedure: HERNIA REPAIR INGUINAL ADULT BILATERAL;  Surgeon: [name]. [name], MD;  Location: [facility];  Service: General;  Laterality: Bilateral;  . Insertion of mesh  12/05/2011    Procedure: INSERTION OF MESH;  Surgeon: [name]. [name], MD;  Location: [facility] OR;  Service: General;  Laterality: Bilateral;   Family History  Problem Relation Age of Onset  . Heart attack Mother   . Stroke Mother   . Cancer Mother     cervial and ovarian  . Diabetes Father   . Heart Problems Brother     ruptured heart  Social History   Social History  . Marital Status: Married    Spouse Name: N/A  . Number of Children: 3  . Years of Education: N/A   Occupational History  . Not on file.   Social History Main Topics  . Smoking status: Former Smoker -- 0.50 packs/day for 5 years    Types: Cigarettes  . Smokeless tobacco: Never Used     Comment: 12/05/2011 "haven't smoked since age [age]"  . Alcohol Use: Yes     Comment: 12/05/2011 "very rarely have a beer"  . Drug Use: No  . Sexual Activity: No   Other Topics Concern  . Not on file   Social History Narrative     Objective: BP 116/73 mmHg  Pulse 90  Wt 151 lb (68.493 kg)  General: Alert and Oriented, No Acute Distress HEENT: Pupils equal, round, reactive to  light. Conjunctivae clear. Moist mucous membranes  Lungs: Clear to auscultation bilaterally, no wheezing/ronchi/rales.  Comfortable work of breathing. Good air movement. Cardiac: Regular rate and rhythm. Normal S1/S2. Grade 3/6 systolic murmur, rubs, nor gallops.   Extremities: No peripheral edema.  Strong peripheral pulses. Full range of motion and strength of both upper and lower extremities. No cogwheeling in the elbows or wrists. Tremor was witnessed only 25% of the time during our encounter today, it consists of low amplitude high frequency shaking of the entire forearms Back: Mild midlines spinous process tenderness in the L3 and L4 region, no paraspinal musculature tenderness but he does feel hypertonic in the lumbars paraspinal musculature.  Mental Status: No depression, anxiety, nor agitation. Skin: Warm and dry.  Assessment & Plan: Roberto Day was seen today for 3 month f/u.  Diagnoses and all orders for this visit:  CKD (chronic kidney disease) stage 3, GFR 30-59 ml/min  Atherosclerosis of aorta  Tremor -     primidone (MYSOLINE) 50 MG tablet; Take 0.5-1 tablets (25-50 mg total) by mouth at bedtime. For tremor prevention.  Midline low back pain, with sciatica presence unspecified -     Cancel: DG Lumbar Spine 2-3 Views; Future   CKD: Stable may continue 60 mg of Lasix Tremor: Reassurance provided that suspicion for Parkinson disease is low at this time given lack of rigidity, resolved shuffling gait, and he clearly shows emotion through his facial features. Discussed that I'll keep this on the differential however now believe essential tremor therefore trial of primidone. Midline low back pain: Obtaining x-rays to rule out compression fracture. Atherosclerosis: Asymptomatic, continue Lipitor  Return in about 3 months (around 11/21/2014).

## 2014-08-22 ENCOUNTER — Telehealth: Payer: Self-pay | Admitting: *Deleted

## 2014-08-22 NOTE — Telephone Encounter (Signed)
Yes that is safe

## 2014-08-22 NOTE — Telephone Encounter (Signed)
Notified daughter

## 2014-08-22 NOTE — Telephone Encounter (Signed)
Roberto Day states that this am patients legs were shaking severely. She wants to know if she can give pt a whole tablet of the primidone instead of a half.

## 2014-08-27 ENCOUNTER — Other Ambulatory Visit: Payer: Self-pay | Admitting: *Deleted

## 2014-08-27 DIAGNOSIS — M19011 Primary osteoarthritis, right shoulder: Secondary | ICD-10-CM

## 2014-08-27 MED ORDER — HYDROCODONE-ACETAMINOPHEN 7.5-325 MG PO TABS: 1.0000 | ORAL_TABLET | Freq: Four times a day (QID) | ORAL | Status: DC | PRN

## 2014-09-01 ENCOUNTER — Telehealth: Payer: Self-pay | Admitting: *Deleted

## 2014-09-01 NOTE — Telephone Encounter (Signed)
Ok stop primidone by taking half a tablet at bedtime for three days then stop completely.  Once he stops this please call and let me know if his new rambling goes away within three days.

## 2014-09-01 NOTE — Telephone Encounter (Signed)
Burna Mortimer is concerned that pt is "rambling" not making sense when he talks. Pt has been fatigued and "not feeling well." Burna Mortimer thinks the primidone is causing him not to feel well. She wanted to know if the zyrtec or primidone could be affecting his congnitive function and causing the fatigue. She has noticed a big difference in him since he started taking the primidone and especially when the tablet was increased from a half  to a whole tablet. She also thought that she heard that allergy medication can affect older people differently and was wondering if this could be a cause of some of his sxs. Please advise

## 2014-09-02 NOTE — Telephone Encounter (Signed)
Spoke with Roberto Day and she states this is second day she has not given him the primidone at all. She states he did seem a little better yesterday afternoon. She is waiting for them to get up now to see how he will be. She will call back after the third day he has been off of the medication to let us know how he is doing

## 2014-09-04 ENCOUNTER — Telehealth: Payer: Self-pay | Admitting: *Deleted

## 2014-09-04 NOTE — Telephone Encounter (Signed)
Burna Mortimer called and states this is day four of patient not being on the primidone and he is doing much better mentally. Daughter states that pt is however still shaking.

## 2014-09-04 NOTE — Telephone Encounter (Signed)
Roberto Day called and states this is day four of patient not being on the primidone and he is doing much better mentally. Daughter states that pt is however still shaking. 

## 2014-09-05 MED ORDER — CARBIDOPA-LEVODOPA 10-100 MG PO TABS: 1.0000 | ORAL_TABLET | Freq: Three times a day (TID) | ORAL | Status: DC

## 2014-09-05 NOTE — Telephone Encounter (Signed)
Sue Lush, Just in case his symptoms are actually due to Parkinsons disease I've sent in a medication called Sinemet which is the first line treatment of Parkinsons symptoms.  Please let me know if this helps after this weekend.

## 2014-09-05 NOTE — Telephone Encounter (Signed)
Left message on vm with results  

## 2014-09-10 ENCOUNTER — Other Ambulatory Visit: Payer: Self-pay | Admitting: Family Medicine

## 2014-09-19 ENCOUNTER — Telehealth: Payer: Self-pay | Admitting: *Deleted

## 2014-09-19 DIAGNOSIS — R251 Tremor, unspecified: Secondary | ICD-10-CM

## 2014-09-19 MED ORDER — CARBIDOPA-LEVODOPA 25-100 MG PO TABS: 1.0000 | ORAL_TABLET | Freq: Three times a day (TID) | ORAL | Status: DC

## 2014-09-19 NOTE — Telephone Encounter (Signed)
I'll place a referral right away.  I'd also recommend increasing the dose of carbidopa-levodopa and I'll send a new Rx to [facility] in [place].  While waiting for the neurologist appointment I'd recommend he come in and we can talk about laboratory evaluation options.

## 2014-09-19 NOTE — Telephone Encounter (Signed)
Burna Mortimer was notified

## 2014-09-19 NOTE — Telephone Encounter (Signed)
Daughter in law called and states pt's tremors seem to be worsening. [name]'s sisters are in town and they are requesting  he be sent to a neurologist. Sister was concerned that he may have even had a seizure. She said at one point for a split second it looked as if his "face froze and he zoned out." Sobieski asked if Dr. Ivan Anchors could call and speak his her sister because she " had more insight into the matter" than she did or if she could bring her up her for a second so you could talk to her. I advised her that Dr. Ivan Anchors would not be able to call because he is seeing patient back to back within the clinic and the same applies to if they were to come up here. I did say that I would forward note with concerns and call back with advice

## 2014-09-23 ENCOUNTER — Encounter: Payer: Self-pay | Admitting: Family Medicine

## 2014-09-23 ENCOUNTER — Ambulatory Visit (INDEPENDENT_AMBULATORY_CARE_PROVIDER_SITE_OTHER): Payer: Commercial Managed Care - HMO | Admitting: Family Medicine

## 2014-09-23 VITALS — BP 121/73 | HR 82 | Wt 149.0 lb

## 2014-09-23 DIAGNOSIS — R251 Tremor, unspecified: Secondary | ICD-10-CM

## 2014-09-23 DIAGNOSIS — N400 Enlarged prostate without lower urinary tract symptoms: Secondary | ICD-10-CM | POA: Diagnosis not present

## 2014-09-23 NOTE — Progress Notes (Signed)
CC: Roberto Day is a 81 y.o. male is here for Tremors   Subjective: HPI:  Follow-up tremor: Primidone caused confusion and an overall sensation of sickness. They switch to low-dose of Sinemet and all of the side effects went away. Family has mixed opinions on whether or not his tremor has improved after starting the low-dose of Sinemet and continues to have mixed opinions after this was increased on the weekend. Patient himself is not really sure if it's helped with his tremor. He notices that his tremors were recently worsened by the stress of his daughter being hospitalized for syncope. Symptoms have improved since she is now back at home resting safely. Tremor is currently affecting both the upper and lower extremities but not the neck. It's occurring only at rest. Since I saw him last there's been no new falls or change in personality or facial expressions. He denies any side effects to Sinemet. He had an episode where he experienced shaking while rotating his trunk to change the position of a dining tray, daughter-in-law states that he stopped partway through this purposeful movement and while stationary looked off and the distance for 20 seconds and was not responsive to verbal stimuli. There has been no other motor or sensory disturbances recently. Patient tells me he feels really good these days, he has no complaints other than the tremor being somewhat annoying.   Review Of Systems Outlined In HPI  Past Medical History  Diagnosis Date  . Unintentional weight loss 10/07/2011  . BPH (benign prostatic hyperplasia) 10/07/2011  . Hematuria 10/07/2011  . Bladder stone 10/07/2011  . Aortic stenosis     and aortic insufficiency  . Hyperlipidemia   . Bladder infection     hx of  . GERD (gastroesophageal reflux disease)     only take medication as needed  . Arthritis     "bilateral knees and right shoulder"  . Hypertension     Dr. Laren Boom, [place] [facility]  . Pneumonia     "had  it very frequently when I was a child" (12/05/2011)  . Nitrate poisoning 1945  . Stomach ulcer 1945    "caused by nitrate poisoning" (12/05/2011)  . Migraines     "went away inthe early 1940's" (12/05/2011)    Past Surgical History  Procedure Laterality Date  . Circumcision      1996  . Inner ear surgery  1948    "left; perforated eardrum now" (12/05/2011)  . Inguinal hernia repair  12/05/2011    bilaterally  . Tonsillectomy  1946  . Inguinal hernia repair  12/05/2011    Procedure: HERNIA REPAIR INGUINAL ADULT BILATERAL;  Surgeon: [name]. [name], MD;  Location: [facility] OR;  Service: General;  Laterality: Bilateral;  . Insertion of mesh  12/05/2011    Procedure: INSERTION OF MESH;  Surgeon: [name]. [name], MD;  Location: [facility];  Service: General;  Laterality: Bilateral;   Family History  Problem Relation Age of Onset  . Heart attack Mother   . Stroke Mother   . Cancer Mother     cervial and ovarian  . Diabetes Father   . Heart Problems Brother     ruptured heart    Social History   Social History  . Marital Status: Married    Spouse Name: N/A  . Number of Children: 3  . Years of Education: N/A   Occupational History  . Not on file.   Social History Main Topics  . Smoking status:  Former Smoker -- 0.50 packs/day for 5 years    Types: Cigarettes  . Smokeless tobacco: Never Used     Comment: 12/05/2011 "haven't smoked since age [age]"  . Alcohol Use: Yes     Comment: 12/05/2011 "very rarely have a beer"  . Drug Use: No  . Sexual Activity: No   Other Topics Concern  . Not on file   Social History Narrative     Objective: BP 121/73 mmHg  Pulse 82  Wt 149 lb (67.586 kg)  Vital signs reviewed. General: Alert and Oriented, No Acute Distress HEENT: Pupils equal, round, reactive to light. Conjunctivae clear.  External ears unremarkable.  Moist mucous membranes. Lungs: Clear and comfortable work of breathing, speaking in full sentences without accessory muscle  use. Cardiac: Regular rate and rhythm.  Neuro: CN II-XII grossly intact, full strength/rom of all four extremities, C5/L4/S1 DTRs 2/4 bilaterally, shuffling gait, rapid alternating movements normal, heel-shin test normal, Rhomberg normal. No rigidity in the elbows or wrists. Index finger and thumb finger Rapid tapping without any difficulty whatsoever Extremities: No peripheral edema.  Strong peripheral pulses.  Mental Status: No depression, anxiety, nor agitation. Logical though process.  Skin: Warm and dry.  Assessment & Plan: Sael was seen today for tremors.  Diagnoses and all orders for this visit:  BPH (benign prostatic hyperplasia) -     Ambulatory referral to Urology  Tremor   Tremor: Discussed that his exam today is unchanged compared to prior exams.   About whether or not this is Parkinson's disease because he's not responded to Sinemet like I would've expected. He still shows some features of parkinsonian disease therefore will refer to neurology for second opinion and further evaluation. Continue Sinemet for now given That he is tolerating it without any side effects.  Family needs updated urology referral  25 minutes spent face-to-face during visit today of which at least 50% was counseling or coordinating care regarding: 1. BPH (benign prostatic hyperplasia)   2. Tremor       Return if symptoms worsen or fail to improve.

## 2014-09-26 ENCOUNTER — Other Ambulatory Visit: Payer: Self-pay | Admitting: *Deleted

## 2014-09-26 DIAGNOSIS — M19011 Primary osteoarthritis, right shoulder: Secondary | ICD-10-CM

## 2014-09-26 MED ORDER — HYDROCODONE-ACETAMINOPHEN 7.5-325 MG PO TABS: 1.0000 | ORAL_TABLET | Freq: Four times a day (QID) | ORAL | Status: DC | PRN

## 2014-10-02 ENCOUNTER — Ambulatory Visit (INDEPENDENT_AMBULATORY_CARE_PROVIDER_SITE_OTHER): Payer: Commercial Managed Care - HMO | Admitting: Neurology

## 2014-10-02 ENCOUNTER — Encounter: Payer: Self-pay | Admitting: Neurology

## 2014-10-02 VITALS — BP 109/79 | HR 76 | Ht 67.0 in | Wt 158.0 lb

## 2014-10-02 DIAGNOSIS — R251 Tremor, unspecified: Secondary | ICD-10-CM

## 2014-10-02 NOTE — Addendum Note (Signed)
Addended by: Levert Feinstein on: 10/02/2014 10:17 AM   Modules accepted: Level of Service

## 2014-10-02 NOTE — Progress Notes (Signed)
PATIENT: Roberto Day DOB: [birth year]  Chief Complaint  Patient presents with  . Tremors    He is here with his son, [name], to have his worsening tremors evaluated.  The tremors are present in his whole body but are worse in his bilateral hands. He started taking Sinemet 25-100 TID three weeks ago and has noticed some improvement with the medication.  He has previously tried and failed Primidone.     HISTORICAL  Roberto Day is a 16 LH male, seen in refer by his primary care physician Dr. Laren Boom for evaluation of tremor  He had a past medical history of aortic stenosis, severe arthritis, rotator cuff syndrome, difficulty walking because of his bilateral knee pain  He was noted to have bilateral hands tremor left and worse since beginning of 2016, most noticeable at breakfast, he has severe left arm hand shaking to the point of difficulty holding utensils. Increase tremor when he feel anxious, he denies significant lower extremity tremor, He denies significant sensory loss, no weakness.  He denies loss sense of smell, no dysarthria, no dysphagia, mild constipation, taking Colace, sleeping well,  He is on polypharmacy treatment, Including hydrocodone, tramadol as needed, he was also taking gabapentin 300 mg 4 times a day,  I reviewed most recent laboratory evaluation, creatinine 1.4. Otherwise normal BMP   REVIEW OF SYSTEMS: Full 14 system review of systems performed and notable only for tremor  ALLERGIES: Allergies  Allergen Reactions  . Codeine Other (See Comments)    "felt like I was going to pass out"  . Celebrex [Celecoxib] Other (See Comments)    "caused me to bleed; dr took me off it right away" (12/05/2011)  . Aleve [Naproxen Sodium]     Diarrhea   . Primodone [Primidone]     AMS    HOME MEDICATIONS: Current Outpatient Prescriptions  Medication Sig Dispense Refill  . atorvastatin (LIPITOR) 10 MG tablet Take 1 tablet (10 mg total) by mouth daily. 90 tablet  3  . betaxolol (BETOPTIC-S) 0.25 % ophthalmic suspension Place 1 drop into both eyes 2 (two) times daily. 5 mL 3  . carbidopa-levodopa (SINEMET) 25-100 MG per tablet Take 1 tablet by mouth 3 (three) times daily. 90 tablet 1  . cetirizine (ZYRTEC) 10 MG tablet Take 1 tablet (10 mg total) by mouth daily. 30 tablet 11  . Docusate Sodium (COLACE PO) Take 1 capsule by mouth daily.     . furosemide (LASIX) 20 MG tablet Take 3 tablets (60 mg total) by mouth daily. May take 1 extra tablet daily as needed for swelling 30 tablet   . gabapentin (NEURONTIN) 300 MG capsule TAKE ONE CAPSULE BY MOUTH 4 TIMES DAILY 120 capsule 5  . HYDROcodone-acetaminophen (NORCO) 7.5-325 MG per tablet Take 1 tablet by mouth every 6 (six) hours as needed. 120 tablet 0  . Multiple Vitamins-Minerals (CENTRUM SILVER PO) Take 1 tablet by mouth daily.     . Multiple Vitamins-Minerals (PRESERVISION AREDS PO) Take 1 capsule by mouth 2 (two) times daily.     . Potassium Gluconate 550 MG TABS Take 1 tablet (550 mg total) by mouth daily. 90 each 1  . ranitidine (ZANTAC) 150 MG tablet TAKE ONE TABLET BY MOUTH TWICE DAILY 60 tablet 11  . Saw Palmetto, Serenoa repens, (SAW PALMETTO PO) Take 1 tablet by mouth daily.     . tamsulosin (FLOMAX) 0.4 MG CAPS capsule Take 2 capsules (0.8 mg total) by mouth daily. 180 capsule 1  .  traMADol (ULTRAM) 50 MG tablet TAKE ONE TABLET BY MOUTH EVERY 8 HOURS AS NEEDED 90 tablet 0     PAST MEDICAL HISTORY: Past Medical History  Diagnosis Date  . Unintentional weight loss 10/07/2011  . BPH (benign prostatic hyperplasia) 10/07/2011  . Hematuria 10/07/2011  . Bladder stone 10/07/2011  . Aortic stenosis     and aortic insufficiency  . Hyperlipidemia   . Bladder infection     hx of  . GERD (gastroesophageal reflux disease)     only take medication as needed  . Arthritis     "bilateral knees and right shoulder"  . Hypertension     Dr. Laren Boom, [place] [facility]  . Pneumonia     "had it very  frequently when I was a child" (12/05/2011)  . Nitrate poisoning 1945  . Stomach ulcer 1945    "caused by nitrate poisoning" (12/05/2011)  . Migraines     "went away inthe early 1940's" (12/05/2011)  . Tremors of nervous system     PAST SURGICAL HISTORY: Past Surgical History  Procedure Laterality Date  . Circumcision      1996  . Inner ear surgery  1948    "left; perforated eardrum now" (12/05/2011)  . Inguinal hernia repair  12/05/2011    bilaterally  . Tonsillectomy  1946  . Inguinal hernia repair  12/05/2011    Procedure: HERNIA REPAIR INGUINAL ADULT BILATERAL;  Surgeon: [name]. [name], MD;  Location: [facility];  Service: General;  Laterality: Bilateral;  . Insertion of mesh  12/05/2011    Procedure: INSERTION OF MESH;  Surgeon: [name]. [name], MD;  Location: [facility];  Service: General;  Laterality: Bilateral;    FAMILY HISTORY: Family History  Problem Relation Age of Onset  . Heart attack Mother   . Stroke Mother   . Cancer Mother     cervial and ovarian  . Diabetes Father   . Heart Problems Brother     ruptured heart    SOCIAL HISTORY:  Social History   Social History  . Marital Status: Married    Spouse Name: N/A  . Number of Children: 3  . Years of Education: HS   Occupational History  . Retired    Social History Main Topics  . Smoking status: Former Smoker -- 0.50 packs/day for 5 years    Types: Cigarettes  . Smokeless tobacco: Never Used     Comment: 12/05/2011 "haven't smoked since age [age]"  . Alcohol Use: No  . Drug Use: No  . Sexual Activity: No   Other Topics Concern  . Not on file   Social History Narrative   Lives at home with his son.   Right-handed.   1 cup caffeine per daily.     PHYSICAL EXAM   Filed Vitals:   10/02/14 0936  BP: 109/79  Pulse: 76  Height:  (1.702 m)  Weight: 158 lb (71.668 kg)    Not recorded      Body mass index is 24.74 kg/(m^2).  PHYSICAL EXAMNIATION:  Gen: NAD, conversant, well nourised, obese,  well groomed                     Cardiovascular: irregular heart rate, systolic ejection murmur  Eyes: Conjunctivae clear without exudates or hemorrhage Neck: Supple, no carotid bruise. Pulmonary: Clear to auscultation bilaterally   NEUROLOGICAL EXAM:  MENTAL STATUS: Speech:    Speech is normal; fluent and spontaneous with normal comprehension.  Cognition:  Orientation to time, place and person     Normal recent and remote memory     Normal Attention span and concentration     Normal Language, naming, repeating,spontaneous speech     Fund of knowledge   CRANIAL NERVES: CN II: Visual fields are full to confrontation. Pupils are round equal and briskly reactive to light. CN III, IV, VI: extraocular movement are normal. No ptosis. CN V: Facial sensation is intact to pinprick in all 3 divisions bilaterally. Corneal responses are intact.  CN VII: Face is symmetric with normal eye closure and smile. CN VIII:  Hard of hearing CN IX, X: Palate elevates symmetrically. Phonation is normal. CN XI: Head turning and shoulder shrug are intact CN XII: Tongue is midline with normal movements and no atrophy.  MOTOR: He has limited range of motion of bilateral knees, bilateral shoulders, normal tone, occasionally bilateral hands and lower extremity posture tremor, he has no rigidity, no weakness, no bradykinesia  REFLEXES: Reflexes are hypoactive and symmetric at the biceps, triceps, knees, and ankles. Plantar responses are flexor.  SENSORY: Intact to light touch, pinprick,  and vibration sense are intact in fingers and toes.  COORDINATION: There is no dysmetria on finger-to-nose and heel-knee-shin. Limited by joints pain  GAIT/STANCE: Need assistant to get up from seated position, difficulty initiate gait, antalgic, very unsteady   DIAGNOSTIC DATA (LABS, IMAGING, TESTING) - I reviewed patient records, labs, notes, testing and imaging myself where available.  ASSESSMENT AND  PLAN  TAAHIR GRISBY is a 81 y.o. male   Bilateral hands tremor  Most consistent with exaggerated physiological tremor, differentiation diagnosis also includes medicine side effect in the setting of aging, chronic renal insufficiency.   there was no parkinsonian signs,     He was started on Sinemet 25/100 mg 3 times a day, reported mild symptomatic improvement,  He should keep on Sinemet at current dose,  I went over medication list with him, was tapering off gabapentin, taking Zyrtec, Ultram as needed,   return to clinic in 2 months.  Levert Feinstein, M.D. Ph.D.  [facility] [address] [place], [place] [address] Ph: [phone] Fax: [phone]  CC: Laren Boom, DO

## 2014-10-27 ENCOUNTER — Telehealth: Payer: Self-pay

## 2014-10-27 DIAGNOSIS — M159 Polyosteoarthritis, unspecified: Secondary | ICD-10-CM

## 2014-10-27 DIAGNOSIS — M19011 Primary osteoarthritis, right shoulder: Secondary | ICD-10-CM

## 2014-10-27 DIAGNOSIS — H6123 Impacted cerumen, bilateral: Secondary | ICD-10-CM

## 2014-10-27 MED ORDER — HYDROCODONE-ACETAMINOPHEN 7.5-325 MG PO TABS: 1.0000 | ORAL_TABLET | Freq: Four times a day (QID) | ORAL | Status: DC | PRN

## 2014-10-27 NOTE — Telephone Encounter (Signed)
[place], Rx placed in in-box ready for pickup/faxing.  

## 2014-10-27 NOTE — Telephone Encounter (Signed)
Patient needs a refill on Hydrocodone. I am unable to print. Thank you.  

## 2014-10-27 NOTE — Telephone Encounter (Signed)
Pt daughter in law notified.

## 2014-11-10 ENCOUNTER — Other Ambulatory Visit: Payer: Self-pay | Admitting: Family Medicine

## 2014-11-12 ENCOUNTER — Other Ambulatory Visit: Payer: Self-pay | Admitting: Family Medicine

## 2014-11-16 ENCOUNTER — Other Ambulatory Visit: Payer: Self-pay | Admitting: Family Medicine

## 2014-11-17 ENCOUNTER — Other Ambulatory Visit: Payer: Self-pay | Admitting: Family Medicine

## 2014-11-21 ENCOUNTER — Encounter: Payer: Self-pay | Admitting: Family Medicine

## 2014-11-21 ENCOUNTER — Ambulatory Visit (INDEPENDENT_AMBULATORY_CARE_PROVIDER_SITE_OTHER): Payer: Commercial Managed Care - HMO | Admitting: Family Medicine

## 2014-11-21 VITALS — BP 107/77 | HR 89 | Wt 141.0 lb

## 2014-11-21 DIAGNOSIS — R251 Tremor, unspecified: Secondary | ICD-10-CM | POA: Diagnosis not present

## 2014-11-21 DIAGNOSIS — R739 Hyperglycemia, unspecified: Secondary | ICD-10-CM

## 2014-11-21 DIAGNOSIS — N400 Enlarged prostate without lower urinary tract symptoms: Secondary | ICD-10-CM | POA: Diagnosis not present

## 2014-11-21 LAB — POCT GLYCOSYLATED HEMOGLOBIN (HGB A1C): HEMOGLOBIN A1C: 5.5

## 2014-11-21 MED ORDER — AMBULATORY NON FORMULARY MEDICATION: Status: DC

## 2014-11-21 MED ORDER — CARBIDOPA-LEVODOPA 10-100 MG PO TABS: 1.0000 | ORAL_TABLET | Freq: Every day | ORAL | Status: DC

## 2014-11-21 MED ORDER — AMBULATORY NON FORMULARY MEDICATION: Status: AC

## 2014-11-21 NOTE — Progress Notes (Signed)
CC: Roberto SchlatterRobert J Forsman is a 81 y.o. male is here for Hyperglycemia   Subjective: HPI:  Follow-up BPH: Continues to take tamsulosin and finasteride daily. Denies urinary hesitancy urgency or frequency. Denies straining to urinate.  Follow-up hyperglycemia: No outside blood sugars to report. He tries his best to keep his carbohydrate intake to a minimum. Denies vision loss or new motor or sensory disturbances other than that described below.  For the last month he's noticed that he's feeling lightheaded most hours of the day. Symptoms are worse if he stands up quickly. For the last week all his one to do is sit or lie down for the majority of the day due to the symptoms. He denies shortness of breath or irregular heartbeat. Denies chest pain. Denies worsening tremor.   Review Of Systems Outlined In HPI  Past Medical History  Diagnosis Date  . Unintentional weight loss 10/07/2011  . BPH (benign prostatic hyperplasia) 10/07/2011  . Hematuria 10/07/2011  . Bladder stone 10/07/2011  . Aortic stenosis     and aortic insufficiency  . Hyperlipidemia   . Bladder infection     hx of  . GERD (gastroesophageal reflux disease)     only take medication as needed  . Arthritis     "bilateral knees and right shoulder"  . Hypertension     Dr. Laren BoomSean Delois Silvester, [facility]  . Pneumonia     "had it very frequently when I was a child" (12/05/2011)  . Nitrate poisoning 1945  . Stomach ulcer 1945    "caused by nitrate poisoning" (12/05/2011)  . Migraines     "went away inthe early 1940's" (12/05/2011)  . Tremors of nervous system     Past Surgical History  Procedure Laterality Date  . Circumcision      1996  . Inner ear surgery  1948    "left; perforated eardrum now" (12/05/2011)  . Inguinal hernia repair  12/05/2011    bilaterally  . Tonsillectomy  1946  . Inguinal hernia repair  12/05/2011    Procedure: HERNIA REPAIR INGUINAL ADULT BILATERAL;  Surgeon: Wilmon ArmsMatthew K. Corliss Skainssuei, MD;  Location: [facility] OR;   Service: General;  Laterality: Bilateral;  . Insertion of mesh  12/05/2011    Procedure: INSERTION OF MESH;  Surgeon: Wilmon ArmsMatthew K. Corliss Skainssuei, MD;  Location: [facility] OR;  Service: General;  Laterality: Bilateral;   Family History  Problem Relation Age of Onset  . Heart attack Mother   . Stroke Mother   . Cancer Mother     cervial and ovarian  . Diabetes Father   . Heart Problems Brother     ruptured heart    Social History   Social History  . Marital Status: Married    Spouse Name: N/A  . Number of Children: 3  . Years of Education: HS   Occupational History  . Retired    Social History Main Topics  . Smoking status: Former Smoker -- 0.50 packs/day for 5 years    Types: Cigarettes  . Smokeless tobacco: Never Used     Comment: 12/05/2011 "haven't smoked since age [age]"  . Alcohol Use: No  . Drug Use: No  . Sexual Activity: No   Other Topics Concern  . Not on file   Social History Narrative   Lives at home with his son.   Right-handed.   1 cup caffeine per daily.     Objective: BP 107/77 mmHg  Pulse 89  Wt 141 lb (63.957 kg)  General:  Alert and Oriented, No Acute Distress HEENT: Pupils equal, round, reactive to light. Conjunctivae clear.  Moist mucous membranes pharynx unremarkable Lungs: Clear to auscultation bilaterally, no wheezing/ronchi/rales.  Comfortable work of breathing. Good air movement. Cardiac: Regular rate and rhythm. Normal S1/S2.  No murmurs, rubs, nor gallops.   Extremities: No peripheral edema.  Strong peripheral pulses.  Mental Status: No depression, anxiety, nor agitation. Skin: Warm and dry.  Assessment & Plan: Majour was seen today for hyperglycemia.  Diagnoses and all orders for this visit:  BPH (benign prostatic hyperplasia)  Hyperglycemia -     POCT HgB A1C  Tremor  Other orders -     carbidopa-levodopa (SINEMET IR) 10-100 MG tablet; Take 1 tablet by mouth at bedtime. -     Discontinue: AMBULATORY NON FORMULARY MEDICATION; Collapsible  wheelchair.  Use daily as needed for mobility.  Dx: Osteoarthritis -     AMBULATORY NON FORMULARY MEDICATION; Collapsible light weight wheelchair.  Use daily as needed for mobility.  Dx: Osteoarthritis   BPH: Controlled continue Flomax and finasteride. Hyperglycemia: A1c normal today, controlled, continue keeping carbohydrates to a minimum Tremor: No new signs of Parkinson's disease, currently controlled however I think that his dose of Sinemet is causing him orthostatic hypotension therefore decreasing the dose,will possibly stop this entirely at next visit if needed.\ \ 25 minutes spent face-to-face during visit today of which at least 50% was counseling or coordinating care regarding: 1. BPH (benign prostatic hyperplasia)   2. Hyperglycemia   3. Tremor        Return in about 3 months (around 02/21/2015).

## 2014-11-24 ENCOUNTER — Other Ambulatory Visit: Payer: Self-pay

## 2014-11-24 DIAGNOSIS — M19011 Primary osteoarthritis, right shoulder: Secondary | ICD-10-CM

## 2014-11-24 MED ORDER — HYDROCODONE-ACETAMINOPHEN 7.5-325 MG PO TABS: 1.0000 | ORAL_TABLET | Freq: Four times a day (QID) | ORAL | Status: DC | PRN

## 2014-12-03 ENCOUNTER — Encounter: Payer: Self-pay | Admitting: Neurology

## 2014-12-03 ENCOUNTER — Ambulatory Visit (INDEPENDENT_AMBULATORY_CARE_PROVIDER_SITE_OTHER): Payer: Commercial Managed Care - HMO | Admitting: Neurology

## 2014-12-03 VITALS — BP 103/67 | HR 94

## 2014-12-03 DIAGNOSIS — G250 Essential tremor: Secondary | ICD-10-CM | POA: Diagnosis not present

## 2014-12-03 DIAGNOSIS — R269 Unspecified abnormalities of gait and mobility: Secondary | ICD-10-CM | POA: Diagnosis not present

## 2014-12-03 NOTE — Progress Notes (Signed)
Chief Complaint  Patient presents with  . Tremors    He is here with his son, Diane.  Feels his tremors have improved and only worsen with stress.  His dosage of Sinemet has been reduced to 10-100mg  TID.      PATIENT: Roberto Day DOB: 07/20/1921  Chief Complaint  Patient presents with  . Tremors    He is here with his son, Wagner.  Feels his tremors have improved and only worsen with stress.  His dosage of Sinemet has been reduced to 10-100mg  TID.     HISTORICAL  Roberto Day is a 25 left-handed male, seen in refer by his primary care physician Dr. Laren Boom for evaluation of tremor  He had a past medical history of aortic stenosis, severe arthritis, rotator cuff syndrome, difficulty walking because of his bilateral knee pain  He was noted to have bilateral hands tremor left and worse since beginning of 2016, most noticeable at breakfast, he has severe left arm hand shaking to the point of difficulty holding utensils. Increased tremor when he feels anxious, he denies significant lower extremity tremor, He denies significant sensory loss, no weakness.  He denies loss sense of smell, no dysarthria, no dysphagia, he complains of mild constipation, taking Colace, sleeping well,  He is on polypharmacy treatment, Including hydrocodone, tramadol as needed, he was also taking gabapentin 300 mg 4 times a day,  I reviewed most recent laboratory evaluation, creatinine 1.4. Otherwise normal BMP   UPDATE Dec 03 2014: He is with his son at today's clinical visit, He complains of constant pain at his shoulder, knees, his hand tremor has much improved, only happened at early morning, stop gabapentin has helped, he is still taking sinemet 10/100 tid , was not sure it is helpful not.  He does have anxiety, increased tremor during anxiety   REVIEW OF SYSTEMS: Full 14 system review of systems performed and notable only for gait difficulty, multiple joints pain,  ALLERGIES: Allergies    Allergen Reactions  . Codeine Other (See Comments)    "felt like I was going to pass out"  . Celebrex [Celecoxib] Other (See Comments)    "caused me to bleed; dr took me off it right away" (12/05/2011)  . Aleve [Naproxen Sodium]     Diarrhea   . Primodone [Primidone]     AMS    HOME MEDICATIONS: Current Outpatient Prescriptions  Medication Sig Dispense Refill  . atorvastatin (LIPITOR) 10 MG tablet Take 1 tablet (10 mg total) by mouth daily. 90 tablet 3  . betaxolol (BETOPTIC-S) 0.25 % ophthalmic suspension Place 1 drop into both eyes 2 (two) times daily. 5 mL 3  . carbidopa-levodopa (SINEMET) 25-100 MG per tablet Take 1 tablet by mouth 3 (three) times daily. 90 tablet 1  . cetirizine (ZYRTEC) 10 MG tablet Take 1 tablet (10 mg total) by mouth daily. 30 tablet 11  . Docusate Sodium (COLACE PO) Take 1 capsule by mouth daily.     . furosemide (LASIX) 20 MG tablet Take 3 tablets (60 mg total) by mouth daily. May take 1 extra tablet daily as needed for swelling 30 tablet   . gabapentin (NEURONTIN) 300 MG capsule TAKE ONE CAPSULE BY MOUTH 4 TIMES DAILY 120 capsule 5  . HYDROcodone-acetaminophen (NORCO) 7.5-325 MG per tablet Take 1 tablet by mouth every 6 (six) hours as needed. 120 tablet 0  . Multiple Vitamins-Minerals (CENTRUM SILVER PO) Take 1 tablet by mouth daily.     . Multiple Vitamins-Minerals (  PRESERVISION AREDS PO) Take 1 capsule by mouth 2 (two) times daily.     . Potassium Gluconate 550 MG TABS Take 1 tablet (550 mg total) by mouth daily. 90 each 1  . ranitidine (ZANTAC) 150 MG tablet TAKE ONE TABLET BY MOUTH TWICE DAILY 60 tablet 11  . Saw Palmetto, Serenoa repens, (SAW PALMETTO PO) Take 1 tablet by mouth daily.     . tamsulosin (FLOMAX) 0.4 MG CAPS capsule Take 2 capsules (0.8 mg total) by mouth daily. 180 capsule 1  . traMADol (ULTRAM) 50 MG tablet TAKE ONE TABLET BY MOUTH EVERY 8 HOURS AS NEEDED 90 tablet 0     PAST MEDICAL HISTORY: Past Medical History  Diagnosis Date  .  Unintentional weight loss 10/07/2011  . BPH (benign prostatic hyperplasia) 10/07/2011  . Hematuria 10/07/2011  . Bladder stone 10/07/2011  . Aortic stenosis     and aortic insufficiency  . Hyperlipidemia   . Bladder infection     hx of  . GERD (gastroesophageal reflux disease)     only take medication as needed  . Arthritis     "bilateral knees and right shoulder"  . Hypertension     Dr. Laren BoomSean Hommel, [facility]  . Pneumonia     "had it very frequently when I was a child" (12/05/2011)  . Nitrate poisoning 1945  . Stomach ulcer 1945    "caused by nitrate poisoning" (12/05/2011)  . Migraines     "went away inthe early 1940's" (12/05/2011)  . Tremors of nervous system     PAST SURGICAL HISTORY: Past Surgical History  Procedure Laterality Date  . Circumcision      1996  . Inner ear surgery  1948    "left; perforated eardrum now" (12/05/2011)  . Inguinal hernia repair  12/05/2011    bilaterally  . Tonsillectomy  1946  . Inguinal hernia repair  12/05/2011    Procedure: HERNIA REPAIR INGUINAL ADULT BILATERAL;  Surgeon: Wilmon ArmsMatthew K. Corliss Skainssuei, MD;  Location: [facility] OR;  Service: General;  Laterality: Bilateral;  . Insertion of mesh  12/05/2011    Procedure: INSERTION OF MESH;  Surgeon: Wilmon ArmsMatthew K. Corliss Skainssuei, MD;  Location: [facility] OR;  Service: General;  Laterality: Bilateral;    FAMILY HISTORY: Family History  Problem Relation Age of Onset  . Heart attack Mother   . Stroke Mother   . Cancer Mother     cervial and ovarian  . Diabetes Father   . Heart Problems Brother     ruptured heart    SOCIAL HISTORY:  Social History   Social History  . Marital Status: Married    Spouse Name: N/A  . Number of Children: 3  . Years of Education: HS   Occupational History  . Retired    Social History Main Topics  . Smoking status: Former Smoker -- 0.50 packs/day for 5 years    Types: Cigarettes  . Smokeless tobacco: Never Used     Comment: 12/05/2011 "haven't smoked since age [age]"  .  Alcohol Use: No  . Drug Use: No  . Sexual Activity: No   Other Topics Concern  . Not on file   Social History Narrative   Lives at home with his son.   Right-handed.   1 cup caffeine per daily.     PHYSICAL EXAM   Filed Vitals:   12/03/14 0909  BP: 103/67  Pulse: 94    Not recorded      There is no weight on file  to calculate BMI.  PHYSICAL EXAMNIATION:  Gen: NAD, conversant, well nourised, obese, well groomed                     Cardiovascular: irregular heart rate, systolic ejection murmur  Eyes: Conjunctivae clear without exudates or hemorrhage Neck: Supple, no carotid bruise. Pulmonary: Clear to auscultation bilaterally   NEUROLOGICAL EXAM:  MENTAL STATUS: Speech:    Speech is normal; fluent and spontaneous with normal comprehension.  Cognition:     Orientation to time, place and person     Normal recent and remote memory     Normal Attention span and concentration     Normal Language, naming, repeating,spontaneous speech     Fund of knowledge   CRANIAL NERVES: CN II: Visual fields are full to confrontation. Pupils are round equal and briskly reactive to light. CN III, IV, VI: extraocular movement are normal. No ptosis. CN V: Facial sensation is intact to pinprick in all 3 divisions bilaterally. Corneal responses are intact.  CN VII: Face is symmetric with normal eye closure and smile. CN VIII:  Hard of hearing CN IX, X: Palate elevates symmetrically. Phonation is normal. CN XI: Head turning and shoulder shrug are intact CN XII: Tongue is midline with normal movements and no atrophy.  MOTOR: He has limited range of motion of bilateral knees, bilateral shoulders, normal tone, occasionally bilateral hands and lower extremity posture tremor, he has no rigidity, no weakness, no bradykinesia  REFLEXES: Reflexes are hypoactive and symmetric at the biceps, triceps, knees, and ankles. Plantar responses are flexor.  SENSORY: Intact to light touch, pinprick,   and vibration sense are intact in fingers and toes.  COORDINATION: There is no dysmetria on finger-to-nose and heel-knee-shin. Limited by joints pain  GAIT/STANCE: Need assistant to get up from seated position, difficulty initiate gait, antalgic, very unsteady   DIAGNOSTIC DATA (LABS, IMAGING, TESTING) - I reviewed patient records, labs, notes, testing and imaging myself where available.  ASSESSMENT AND PLAN  Roberto Day is a 81 y.o. male   Bilateral hands tremor  Most consistent with exaggerated physiological tremor, differentiation diagnosis also includes medicine side effect in the setting of aging, chronic renal  insufficiency. there was no parkinsonian signs,   I have advised him stop take Sinemet  His symptoms overall has improved after stopped taking gabapentin,    Levert Feinstein, M.D. Ph.D.  [facility] [address] [place], [place] [address] Ph: [phone] Fax: [phone]  CC: Laren Boom, DO

## 2014-12-22 ENCOUNTER — Other Ambulatory Visit: Payer: Self-pay

## 2014-12-22 DIAGNOSIS — M19011 Primary osteoarthritis, right shoulder: Secondary | ICD-10-CM

## 2014-12-22 MED ORDER — HYDROCODONE-ACETAMINOPHEN 7.5-325 MG PO TABS: 1.0000 | ORAL_TABLET | Freq: Four times a day (QID) | ORAL | Status: DC | PRN

## 2015-01-21 ENCOUNTER — Other Ambulatory Visit: Payer: Self-pay

## 2015-01-21 DIAGNOSIS — M19011 Primary osteoarthritis, right shoulder: Secondary | ICD-10-CM

## 2015-01-21 MED ORDER — HYDROCODONE-ACETAMINOPHEN 7.5-325 MG PO TABS: 1.0000 | ORAL_TABLET | Freq: Four times a day (QID) | ORAL | Status: DC | PRN

## 2015-01-27 ENCOUNTER — Telehealth: Payer: Self-pay | Admitting: Family Medicine

## 2015-01-27 DIAGNOSIS — M170 Bilateral primary osteoarthritis of knee: Secondary | ICD-10-CM

## 2015-01-27 DIAGNOSIS — M19019 Primary osteoarthritis, unspecified shoulder: Secondary | ICD-10-CM

## 2015-01-27 NOTE — Telephone Encounter (Signed)
Needs new referral

## 2015-02-12 ENCOUNTER — Ambulatory Visit (INDEPENDENT_AMBULATORY_CARE_PROVIDER_SITE_OTHER): Payer: Medicare Other | Admitting: Family Medicine

## 2015-02-12 ENCOUNTER — Encounter: Payer: Self-pay | Admitting: Family Medicine

## 2015-02-12 ENCOUNTER — Ambulatory Visit (INDEPENDENT_AMBULATORY_CARE_PROVIDER_SITE_OTHER): Payer: Medicare Other

## 2015-02-12 VITALS — BP 130/68 | HR 102 | Temp 98.3°F

## 2015-02-12 DIAGNOSIS — J8410 Pulmonary fibrosis, unspecified: Secondary | ICD-10-CM

## 2015-02-12 DIAGNOSIS — J189 Pneumonia, unspecified organism: Secondary | ICD-10-CM | POA: Diagnosis not present

## 2015-02-12 DIAGNOSIS — R05 Cough: Secondary | ICD-10-CM | POA: Diagnosis not present

## 2015-02-12 MED ORDER — MOXIFLOXACIN HCL 400 MG PO TABS: 400.0000 mg | ORAL_TABLET | Freq: Every day | ORAL | Status: DC

## 2015-02-12 NOTE — Assessment & Plan Note (Addendum)
Acute, likely bacterial given exam and preliminary CXR findings. Will start moxifloxacin. Follow up in 1 week or sooner if worsening.

## 2015-02-12 NOTE — Patient Instructions (Signed)
Thank you for coming in today. Take avelox daily.  Return in 1 week. Call or go to the emergency room if you get worse, have trouble breathing, have chest pains, or palpitations.   Community-Acquired Pneumonia, Adult Pneumonia is an infection of the lungs. There are different types of pneumonia. One type can develop while a person is in a hospital. A different type, called community-acquired pneumonia, develops in people who are not, or have not recently been, in the hospital or other health care facility.  CAUSES Pneumonia may be caused by bacteria, viruses, or funguses. Community-acquired pneumonia is often caused by Streptococcus pneumonia bacteria. These bacteria are often passed from one person to another by breathing in droplets from the cough or sneeze of an infected person. RISK FACTORS The condition is more likely to develop in:  People who havechronic diseases, such as chronic obstructive pulmonary disease (COPD), asthma, congestive heart failure, cystic fibrosis, diabetes, or kidney disease.  People who haveearly-stage or late-stage HIV.  People who havesickle cell disease.  People who havehad their spleen removed (splenectomy).  People who havepoor Administrator.  People who havemedical conditions that increase the risk of breathing in (aspirating) secretions their own mouth and nose.   People who havea weakened immune system (immunocompromised).  People who smoke.  People whotravel to areas where pneumonia-causing germs commonly exist.  People whoare around animal habitats or animals that have pneumonia-causing germs, including birds, bats, rabbits, cats, and farm animals. SYMPTOMS Symptoms of this condition include:  Adry cough.  A wet (productive) cough.  Fever.  Sweating.  Chest pain, especially when breathing deeply or coughing.  Rapid breathing or difficulty breathing.  Shortness of breath.  Shaking chills.  Fatigue.  Muscle  aches. DIAGNOSIS Your health care provider will take a medical history and perform a physical exam. You may also have other tests, including:  Imaging studies of your chest, including X-rays.  Tests to check your blood oxygen level and other blood gases.  Other tests on blood, mucus (sputum), fluid around your lungs (pleural fluid), and urine. If your pneumonia is severe, other tests may be done to identify the specific cause of your illness. TREATMENT The type of treatment that you receive depends on many factors, such as the cause of your pneumonia, the medicines you take, and other medical conditions that you have. For most adults, treatment and recovery from pneumonia may occur at home. In some cases, treatment must happen in a hospital. Treatment may include:  Antibiotic medicines, if the pneumonia was caused by bacteria.  Antiviral medicines, if the pneumonia was caused by a virus.  Medicines that are given by mouth or through an IV tube.  Oxygen.  Respiratory therapy. Although rare, treating severe pneumonia may include:  Mechanical ventilation. This is done if you are not breathing well on your own and you cannot maintain a safe blood oxygen level.  Thoracentesis. This procedureremoves fluid around one lung or both lungs to help you breathe better. HOME CARE INSTRUCTIONS  Take over-the-counter and prescription medicines only as told by your health care provider.  Only takecough medicine if you are losing sleep. Understand that cough medicine can prevent your body's natural ability to remove mucus from your lungs.  If you were prescribed an antibiotic medicine, take it as told by your health care provider. Do not stop taking the antibiotic even if you start to feel better.  Sleep in a semi-upright position at night. Try sleeping in a reclining chair, or place a  few pillows under your head.  Do not use tobacco products, including cigarettes, chewing tobacco, and  e-cigarettes. If you need help quitting, ask your health care provider.  Drink enough water to keep your urine clear or pale yellow. This will help to thin out mucus secretions in your lungs. PREVENTION There are ways that you can decrease your risk of developing community-acquired pneumonia. Consider getting a pneumococcal vaccine if:  You are older than 80 years of age.  You are older than 80 years of age and are undergoing cancer treatment, have chronic lung disease, or have other medical conditions that affect your immune system. Ask your health care provider if this applies to you. There are different types and schedules of pneumococcal vaccines. Ask your health care provider which vaccination option is best for you. You may also prevent community-acquired pneumonia if you take these actions:  Get an influenza vaccine every year. Ask your health care provider which type of influenza vaccine is best for you.  Go to the dentist on a regular basis.  Wash your hands often. Use hand sanitizer if soap and water are not available. SEEK MEDICAL CARE IF:  You have a fever.  You are losing sleep because you cannot control your cough with cough medicine. SEEK IMMEDIATE MEDICAL CARE IF:  You have worsening shortness of breath.  You have increased chest pain.  Your sickness becomes worse, especially if you are an older adult or have a weakened immune system.  You cough up blood.   This information is not intended to replace advice given to you by your health care provider. Make sure you discuss any questions you have with your health care provider.   Document Released: 12/20/2004 Document Revised: 09/10/2014 Document Reviewed: 04/16/2014 Elsevier Interactive Patient Education [employer]

## 2015-02-12 NOTE — Progress Notes (Signed)
Roberto Day is a 82 y.o. male who presents to [facility]: Primary Care today for productive cough. Patient presents with his daughter-in-law.   Patient has a one week history of increasingly productive cough of white mucus. Patient has had several paroxysms of coughing up mucus which has caused him chest pain. He feels short of breath at those times but ot otherwise, though he is mostly confined to a wheelchair secondary to bilateral knee arthritis. Patient notes the worst of which was last night which prompted evaluation today. Patient has had temperature in the 98s for the last week. He did not eat dinner or breakfast this AM which is atypical per daughter in law. Patient denies hemoptysis, pleuritic pain, aches, chills, nausea, vomiting.    Patient has had pneumonia the last 4 years and says this feels similar. Patient has a history of pulmonary fibrosis currently followed by Dr. Vassie Loll, due to see him in 1 month. Patient has history of mitral stenosis and aortic regurgitation which has been evaluated by cardiology and deemed nonsurgical candidate for repair. Patient moved here from [facility], [place], and has not been back since.    Past Medical History  Diagnosis Date  . Unintentional weight loss 10/07/2011  . BPH (benign prostatic hyperplasia) 10/07/2011  . Hematuria 10/07/2011  . Bladder stone 10/07/2011  . Aortic stenosis     and aortic insufficiency  . Hyperlipidemia   . Bladder infection     hx of  . GERD (gastroesophageal reflux disease)     only take medication as needed  . Arthritis     "bilateral knees and right shoulder"  . Hypertension     Dr. Laren Boom, [place] primary care  . Pneumonia     "had it very frequently when I was a child" (12/05/2011)  . Nitrate poisoning 1945  . Stomach ulcer 1945    "caused by nitrate poisoning" (12/05/2011)  . Migraines     "went  away inthe early 1940's" (12/05/2011)  . Tremors of nervous system    Past Surgical History  Procedure Laterality Date  . Circumcision      1996  . Inner ear surgery  1948    "left; perforated eardrum now" (12/05/2011)  . Inguinal hernia repair  12/05/2011    bilaterally  . Tonsillectomy  1946  . Inguinal hernia repair  12/05/2011    Procedure: HERNIA REPAIR INGUINAL ADULT BILATERAL;  Surgeon: [name]. [name], MD;  Location: [facility] OR;  Service: General;  Laterality: Bilateral;  . Insertion of mesh  12/05/2011    Procedure: INSERTION OF MESH;  Surgeon: [name]. [name], MD;  Location: [facility] OR;  Service: General;  Laterality: Bilateral;   Social History  Substance Use Topics  . Smoking status: Former Smoker -- 0.50 packs/day for 5 years    Types: Cigarettes  . Smokeless tobacco: Never Used     Comment: 12/05/2011 "haven't smoked since age [age]"  . Alcohol Use: No   family history includes Cancer in his mother; Diabetes in his father; Heart Problems in his brother; Heart attack in his mother; Stroke in his mother.  ROS as above Medications: Current Outpatient Prescriptions  Medication Sig Dispense Refill  . AMBULATORY NON FORMULARY MEDICATION Collapsible light weight wheelchair.  Use daily as needed for mobility.  Dx: Osteoarthritis 1 Units 0  . atorvastatin (LIPITOR) 10 MG tablet Take 1 tablet (10 mg total) by mouth daily. 90 tablet 3  . betaxolol (  BETOPTIC-S) 0.25 % ophthalmic suspension Place 1 drop into both eyes 2 (two) times daily. 5 mL 3  . carbidopa-levodopa (SINEMET IR) 10-100 MG tablet Take 1 tablet by mouth at bedtime. 30 tablet 5  . cetirizine (ZYRTEC) 10 MG tablet Take 1 tablet (10 mg total) by mouth daily. 30 tablet 11  . Docusate Sodium (COLACE PO) Take 1 capsule by mouth daily.     . finasteride (PROSCAR) 5 MG tablet Take 5 mg by mouth daily.    . furosemide (LASIX) 20 MG tablet Take 3 tablets (60 mg total) by mouth daily. May take 1 extra tablet daily as needed for swelling  30 tablet   . HYDROcodone-acetaminophen (NORCO) 7.5-325 MG tablet Take 1 tablet by mouth every 6 (six) hours as needed. 120 tablet 0  . Multiple Vitamins-Minerals (CENTRUM SILVER PO) Take 1 tablet by mouth daily.     . Multiple Vitamins-Minerals (PRESERVISION AREDS PO) Take 1 capsule by mouth 2 (two) times daily.     . Potassium Gluconate 550 MG TABS Take 1 tablet (550 mg total) by mouth daily. 90 each 1  . ranitidine (ZANTAC) 150 MG tablet TAKE ONE TABLET BY MOUTH TWICE DAILY 60 tablet 11  . Saw Palmetto, Serenoa repens, (SAW PALMETTO PO) Take 1 tablet by mouth daily.     . tamsulosin (FLOMAX) 0.4 MG CAPS capsule TAKE TWO CAPSULES BY MOUTH ONCE DAILY 180 capsule 0  . traMADol (ULTRAM) 50 MG tablet TAKE ONE TABLET BY MOUTH EVERY 8 HOURS AS NEEDED 90 tablet 0  . moxifloxacin (AVELOX) 400 MG tablet Take 1 tablet (400 mg total) by mouth daily at 8 pm. 7 tablet 0   No current facility-administered medications for this visit.   Allergies  Allergen Reactions  . Codeine Other (See Comments)    "felt like I was going to pass out"  . Celebrex [Celecoxib] Other (See Comments)    "caused me to bleed; dr took me off it right away" (12/05/2011)  . Aleve [Naproxen Sodium]     Diarrhea   . Primodone [Primidone]     AMS     Exam:  BP 130/68 mmHg  Pulse 102  Temp(Src) 98.3 F (36.8 C) (Oral)  SpO2 100% Gen: Mildly ill appearing gentleman in NAD HEENT: EOMI,  MMM Lungs: Normal work of breathing. CTABL, subtle diminished breath sounds in left lower lung fields, mild dullness to percussion over same area  Heart: Mildly tachycardic, 3/6 diastolic murmur best heard at LLSB, 2/6 systolic component as well  Abd: NABS, Soft. Nondistended, Nontender Exts: Brisk capillary refill, warm and well perfused.   No results found for this or any previous visit (from the past 24 hour(s)). Dg Chest 2 View  02/12/2015  CLINICAL DATA:  Productive cough for 1 week.  Pulmonary fibrosis. EXAM: CHEST  2 VIEW  COMPARISON:  05/22/2013 chest radiograph.  11/18/2013 chest CT. FINDINGS: Stable cardiomediastinal silhouette with normal heart size. No pneumothorax. No pleural effusion. No pulmonary edema. No acute consolidative airspace disease. Peripheral basilar interstitial opacities are again noted in both lungs, which may be slightly worsened. IMPRESSION: 1. No acute consolidative airspace disease to suggest a pneumonia. 2. Bibasilar interstitial lung opacities, possibly slightly worsened since 05/22/2013. Consider further evaluation with high-resolution chest CT. Electronically Signed   By: Delbert Phenix M.D.   On: 02/12/2015 10:39     Please see individual assessment and plan sections.

## 2015-02-13 ENCOUNTER — Telehealth: Payer: Self-pay

## 2015-02-13 ENCOUNTER — Other Ambulatory Visit: Payer: Self-pay | Admitting: Family Medicine

## 2015-02-13 DIAGNOSIS — R05 Cough: Secondary | ICD-10-CM

## 2015-02-13 DIAGNOSIS — R059 Cough, unspecified: Secondary | ICD-10-CM

## 2015-02-13 NOTE — Telephone Encounter (Signed)
Attempted to call, left VM, will try again next week.

## 2015-02-13 NOTE — Telephone Encounter (Signed)
Daughter in-law is asking that you call her personally and discuss Mr. Ketcher recent chest x-ray results.

## 2015-02-13 NOTE — Progress Notes (Signed)
Quick Note:  Worsening overall lung disease. No obvious pneumonia. Continue antibiotics. Follow up with Dr. Ivan Anchors. ______

## 2015-02-16 ENCOUNTER — Other Ambulatory Visit: Payer: Self-pay | Admitting: Family Medicine

## 2015-02-16 MED ORDER — PREDNISONE 20 MG PO TABS: ORAL_TABLET | ORAL | Status: AC

## 2015-02-16 NOTE — Telephone Encounter (Signed)
Addressed daughter in law's concerns.

## 2015-02-18 ENCOUNTER — Other Ambulatory Visit: Payer: Self-pay | Admitting: Family Medicine

## 2015-02-19 ENCOUNTER — Encounter: Payer: Self-pay | Admitting: Family Medicine

## 2015-02-19 ENCOUNTER — Ambulatory Visit (INDEPENDENT_AMBULATORY_CARE_PROVIDER_SITE_OTHER): Payer: Medicare Other | Admitting: Family Medicine

## 2015-02-19 VITALS — BP 125/69 | HR 80

## 2015-02-19 DIAGNOSIS — R05 Cough: Secondary | ICD-10-CM

## 2015-02-19 DIAGNOSIS — R059 Cough, unspecified: Secondary | ICD-10-CM

## 2015-02-19 MED ORDER — TAMSULOSIN HCL 0.4 MG PO CAPS: ORAL_CAPSULE | ORAL | Status: DC

## 2015-02-19 MED ORDER — BENZONATATE 200 MG PO CAPS: 200.0000 mg | ORAL_CAPSULE | Freq: Three times a day (TID) | ORAL | Status: DC | PRN

## 2015-02-19 NOTE — Progress Notes (Signed)
CC: Roberto Day is a 82 y.o. male is here for Pneumonia   Subjective: HPI:  Continued cough, much less than when he presented last week. Symptoms are worse in the evening and in dry environments. No benefit from over-the-counter cough and cold medication. He believes that moxifloxacin was slightly beneficial however much greater improvement after starting prednisone. He denies any known side effects. No shortness of breath, wheezing or blood in sputum. He denies any chest pain. Denies any nasal or facial congestion.denies wheezing, fevers or chills   Review Of Systems Outlined In HPI  Past Medical History  Diagnosis Date  . Unintentional weight loss 10/07/2011  . BPH (benign prostatic hyperplasia) 10/07/2011  . Hematuria 10/07/2011  . Bladder stone 10/07/2011  . Aortic stenosis     and aortic insufficiency  . Hyperlipidemia   . Bladder infection     hx of  . GERD (gastroesophageal reflux disease)     only take medication as needed  . Arthritis     "bilateral knees and right shoulder"  . Hypertension     Dr. Laren Boom, [place] primary care  . Pneumonia     "had it very frequently when I was a child" (12/05/2011)  . Nitrate poisoning 1945  . Stomach ulcer 1945    "caused by nitrate poisoning" (12/05/2011)  . Migraines     "went away inthe early 1940's" (12/05/2011)  . Tremors of nervous system     Past Surgical History  Procedure Laterality Date  . Circumcision      1996  . Inner ear surgery  1948    "left; perforated eardrum now" (12/05/2011)  . Inguinal hernia repair  12/05/2011    bilaterally  . Tonsillectomy  1946  . Inguinal hernia repair  12/05/2011    Procedure: HERNIA REPAIR INGUINAL ADULT BILATERAL;  Surgeon: [name]. [name], MD;  Location: [facility] OR;  Service: General;  Laterality: Bilateral;  . Insertion of mesh  12/05/2011    Procedure: INSERTION OF MESH;  Surgeon: [name]. [name], MD;  Location: [facility] OR;  Service: General;  Laterality: Bilateral;   Family  History  Problem Relation Age of Onset  . Heart attack Mother   . Stroke Mother   . Cancer Mother     cervial and ovarian  . Diabetes Father   . Heart Problems Brother     ruptured heart    Social History   Social History  . Marital Status: Married    Spouse Name: N/A  . Number of Children: 3  . Years of Education: HS   Occupational History  . Retired    Social History Main Topics  . Smoking status: Former Smoker -- 0.50 packs/day for 5 years    Types: Cigarettes  . Smokeless tobacco: Never Used     Comment: 12/05/2011 "haven't smoked since age [age]"  . Alcohol Use: No  . Drug Use: No  . Sexual Activity: No   Other Topics Concern  . Not on file   Social History Narrative   Lives at home with his son.   Right-handed.   1 cup caffeine per daily.     Objective: BP 125/69 mmHg  Pulse 80  SpO2 97%  General: Alert and Oriented, No Acute Distress HEENT: Pupils equal, round, reactive to light. Conjunctivae clear.  Moist mucous membranes Lungs: Clear to auscultation bilaterally, no wheezing/ronchi/rales.  Comfortable work of breathing. Good air movement.. Extremities: No peripheral edema.  Strong peripheral pulses.  Mental Status: No depression,  anxiety, nor agitation. Skin: Warm and dry.  Assessment & Plan: Sachin was seen today for pneumonia.  Diagnoses and all orders for this visit:  Cough  Other orders -     benzonatate (TESSALON) 200 MG capsule; Take 1 capsule (200 mg total) by mouth 3 (three) times daily as needed for cough. -     tamsulosin (FLOMAX) 0.4 MG CAPS capsule; TAKE TWO CAPSULES BY MOUTH ONCE DAILY   Cough: No signs of bacterial infection, start Tessalon Perles. Signs and symptoms requring emergent/urgent reevaluation were discussed with the patient.  Daughter-in-law has requested that we refill his tamsulosin, no more refills remaining at the pharmacy.  Return if symptoms worsen or fail to improve.

## 2015-02-20 DIAGNOSIS — M1711 Unilateral primary osteoarthritis, right knee: Secondary | ICD-10-CM | POA: Diagnosis not present

## 2015-02-20 DIAGNOSIS — M25561 Pain in right knee: Secondary | ICD-10-CM | POA: Diagnosis not present

## 2015-02-20 DIAGNOSIS — M1712 Unilateral primary osteoarthritis, left knee: Secondary | ICD-10-CM | POA: Diagnosis not present

## 2015-02-20 DIAGNOSIS — M25562 Pain in left knee: Secondary | ICD-10-CM | POA: Diagnosis not present

## 2015-02-20 DIAGNOSIS — M1990 Unspecified osteoarthritis, unspecified site: Secondary | ICD-10-CM | POA: Diagnosis not present

## 2015-02-24 ENCOUNTER — Ambulatory Visit (INDEPENDENT_AMBULATORY_CARE_PROVIDER_SITE_OTHER): Payer: Medicare Other | Admitting: Family Medicine

## 2015-02-24 ENCOUNTER — Encounter: Payer: Self-pay | Admitting: Family Medicine

## 2015-02-24 VITALS — BP 123/78 | HR 96

## 2015-02-24 DIAGNOSIS — I351 Nonrheumatic aortic (valve) insufficiency: Secondary | ICD-10-CM | POA: Diagnosis not present

## 2015-02-24 DIAGNOSIS — N400 Enlarged prostate without lower urinary tract symptoms: Secondary | ICD-10-CM

## 2015-02-24 DIAGNOSIS — H6120 Impacted cerumen, unspecified ear: Secondary | ICD-10-CM | POA: Insufficient documentation

## 2015-02-24 DIAGNOSIS — H6123 Impacted cerumen, bilateral: Secondary | ICD-10-CM

## 2015-02-24 DIAGNOSIS — Z23 Encounter for immunization: Secondary | ICD-10-CM

## 2015-02-24 DIAGNOSIS — M19011 Primary osteoarthritis, right shoulder: Secondary | ICD-10-CM | POA: Diagnosis not present

## 2015-02-24 MED ORDER — HYDROCODONE-ACETAMINOPHEN 7.5-325 MG PO TABS: 1.0000 | ORAL_TABLET | Freq: Four times a day (QID) | ORAL | Status: DC | PRN

## 2015-02-24 NOTE — Progress Notes (Signed)
CC: Roberto Day is a 82 y.o. male is here for Hyperlipidemia and Referral   Subjective: HPI:  FU BPH: Currently taking Flomax on a daily basis with 100% compliance. He denies any urinary complaints. He specifically denies any urinary hesitancy, incomplete voiding or waking up at night to urinate.  Follow-up osteoporosis arthritis of the right GH joint. He tells me that symptoms of pain are minimal and almost nonexistent provided he takes hydrocodone every 6-8 hours during the day. He denies any new pain or radiation of the pain. No new weakness.  Follow-up aortic regurgitation. He denies any peripheral edema, new shortness of breath, new motor or sensory disturbances nor any orthopnea  His major complaint today is earwax buildup in both ears that is causing hearing loss beyond his already known sensorineural hearing loss. He would like referral to my ear nose and throat to be evaluated for new hearing aids   Review Of Systems Outlined In HPI  Past Medical History  Diagnosis Date  . Unintentional weight loss 10/07/2011  . BPH (benign prostatic hyperplasia) 10/07/2011  . Hematuria 10/07/2011  . Bladder stone 10/07/2011  . Aortic stenosis     and aortic insufficiency  . Hyperlipidemia   . Bladder infection     hx of  . GERD (gastroesophageal reflux disease)     only take medication as needed  . Arthritis     "bilateral knees and right shoulder"  . Hypertension     Dr. Laren Boom,  primary care  . Pneumonia     "had it very frequently when I was a child" (12/05/2011)  . Nitrate poisoning 1945  . Stomach ulcer 1945    "caused by nitrate poisoning" (12/05/2011)  . Migraines     "went away inthe early 1940's" (12/05/2011)  . Tremors of nervous system     Past Surgical History  Procedure Laterality Date  . Circumcision      1996  . Inner ear surgery  1948    "left; perforated eardrum now" (12/05/2011)  . Inguinal hernia repair  12/05/2011    bilaterally  .  Tonsillectomy  1946  . Inguinal hernia repair  12/05/2011    Procedure: HERNIA REPAIR INGUINAL ADULT BILATERAL;  Surgeon: [name]. [name], MD;  Location: [facility] OR;  Service: General;  Laterality: Bilateral;  . Insertion of mesh  12/05/2011    Procedure: INSERTION OF MESH;  Surgeon: [name]. [name], MD;  Location: [facility] OR;  Service: General;  Laterality: Bilateral;   Family History  Problem Relation Age of Onset  . Heart attack Mother   . Stroke Mother   . Cancer Mother     cervial and ovarian  . Diabetes Father   . Heart Problems Brother     ruptured heart    Social History   Social History  . Marital Status: Married    Spouse Name: N/A  . Number of Children: 3  . Years of Education: HS   Occupational History  . Retired    Social History Main Topics  . Smoking status: Former Smoker -- 0.50 packs/day for 5 years    Types: Cigarettes  . Smokeless tobacco: Never Used     Comment: 12/05/2011 "haven't smoked since age [age]"  . Alcohol Use: No  . Drug Use: No  . Sexual Activity: No   Other Topics Concern  . Not on file   Social History Narrative   Lives at home with his son.   Right-handed.   1  cup caffeine per daily.     Objective: BP 123/78 mmHg  Pulse 96  General: Alert and Oriented, No Acute Distress HEENT: Pupils equal, round, reactive to light. Conjunctivae clear.  Moist mucous membranes Lungs: Clear to auscultation bilaterally, no wheezing/ronchi/rales.  Comfortable work of breathing. Good air movement. Cardiac: Regular rate and rhythm. Normal S1/S2.  Grade 1 over 2 diastolic murmur. No, rubs, nor gallops.   Extremities: No peripheral edema.  Strong peripheral pulses.  Mental Status: No depression, anxiety, nor agitation. Skin: Warm and dry.  Assessment & Plan: Roberto Day was seen today for hyperlipidemia and referral.  Diagnoses and all orders for this visit:  Excess ear wax, bilateral -     Ambulatory referral to ENT  Osteoarthritis of right glenohumeral  joint -     HYDROcodone-acetaminophen (NORCO) 7.5-325 MG tablet; Take 1 tablet by mouth every 6 (six) hours as needed.  Aortic regurgitation  BPH (benign prostatic hyperplasia)   Ear wax: Referral to ear nose and throat seems reasonable, he does not want irrigation here OA of the right shoulder, controlled with Norco. Aortic regurgitation: Asymptomatic no further intervention at this time, blood pressure controlled BPH: Controlled with Flomax   Return in about 3 months (around 05/24/2015) for BP .

## 2015-03-05 ENCOUNTER — Ambulatory Visit (INDEPENDENT_AMBULATORY_CARE_PROVIDER_SITE_OTHER): Payer: Medicare Other | Admitting: Pulmonary Disease

## 2015-03-05 ENCOUNTER — Encounter: Payer: Self-pay | Admitting: Pulmonary Disease

## 2015-03-05 VITALS — BP 151/82 | HR 76 | Ht 67.0 in | Wt 144.0 lb

## 2015-03-05 DIAGNOSIS — I351 Nonrheumatic aortic (valve) insufficiency: Secondary | ICD-10-CM

## 2015-03-05 DIAGNOSIS — J8410 Pulmonary fibrosis, unspecified: Secondary | ICD-10-CM | POA: Diagnosis not present

## 2015-03-05 NOTE — Progress Notes (Signed)
   Subjective:    Patient ID: Roberto Day, male    DOB: Dec 05, 1921, 82 y.o.   MRN: [id number]  HPI  82 y.o remote smoker (quit in 20's) for FU of RML pulmonary nodule & ILD & mod AS  He is wheelchair bound due to bad knees.  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 Follow-up    breathing doing well.  Only trouble is in the morning he gets chest discomfort and hurts for him to take a deep breath.      CXR 02/2015 slight worse   Accompanied by daughter-in-law He is in a wheelchair, hence Dyspnea is difficult to estimate due to his limited mobility, he would walk around the house He denies heartburn or dysphagia Breathing is at baseline -no cough, wheeze, ambulates with walker, limited by bad knees  Significant tests/ events    Echo 11/2012 showed normal LV function. There was mild aortic stenosis and moderate aortic insufficiency. There was mild to moderate mitral stenosis, nml RVSF . He has 3/ 6 ESM at base   CT abdomen for renal stone in 10/2011 had shown ILD & RLL pleural based 3 mm nodule  CT chest 11/2012 showed underlying interstitial lung Disease. There was a a 1.1 x 0.7 x 2.0 cm elongated nodular opacity in the posterior aspect of the right middle lobe abutting the major fissure. There were ill-defined nodular areas of ground-glass attenuation bilaterally in a predominantly upper lung and peribronchovascular distribution, suggestive of an active atypical infectious process.   05/08/2013 CT chest - RML pulmonary nodule previously noted along the right major fissure has resolved. RUL nodule has increased slightly in size. Interim improvement in nodular ill-defined infiltrates . - mild increase in peribronchial interstitial changes noted throughout both lungs Large sliding hiatal hernia. ? component of aspiration pneumonitis   CT 11/2013  chronic pulmonary scarring changes with new areas of inflammation most notable in the right upper lobe posteriorly, RLL nodule faint -improved    Review of Systems Patient denies significant dyspnea,cough, hemoptysis,  chest pain, palpitations, pedal edema, orthopnea, paroxysmal nocturnal dyspnea, lightheadedness, nausea, vomiting, abdominal or  leg pains      Objective:   Physical Exam  Gen. Pleasant, well-nourished, in no distress ENT - no lesions, no post nasal drip Neck: No JVD, no thyromegaly, no carotid bruits Lungs: no use of accessory muscles, no dullness to percussion, basal crackles Cardiovascular: Rhythm regular, heart sounds  normal, ESM 2/6 at base no peripheral edema Musculoskeletal: No deformities, no cyanosis or clubbing        Assessment & Plan:

## 2015-03-05 NOTE — Assessment & Plan Note (Signed)
moderate aortic stenosis on echo

## 2015-03-05 NOTE — Assessment & Plan Note (Signed)
pulmonary fibrosis This may be slightly worse compared to 2015 but your oxygen level is holding up well

## 2015-03-05 NOTE — Patient Instructions (Addendum)
You have pulmonary fibrosis This may be slightly worse compared to 2015 but your oxygen level is holding up well

## 2015-03-14 ENCOUNTER — Other Ambulatory Visit: Payer: Self-pay | Admitting: Family Medicine

## 2015-03-17 ENCOUNTER — Other Ambulatory Visit: Payer: Self-pay | Admitting: Family Medicine

## 2015-03-19 ENCOUNTER — Telehealth: Payer: Self-pay

## 2015-03-19 DIAGNOSIS — M19011 Primary osteoarthritis, right shoulder: Secondary | ICD-10-CM

## 2015-03-20 MED ORDER — HYDROCODONE-ACETAMINOPHEN 7.5-325 MG PO TABS: 1.0000 | ORAL_TABLET | Freq: Four times a day (QID) | ORAL | Status: DC | PRN

## 2015-03-20 NOTE — Telephone Encounter (Signed)
[name] notified

## 2015-03-20 NOTE — Telephone Encounter (Signed)
[place], Rx placed in in-box ready for pickup/faxing.  

## 2015-03-24 DIAGNOSIS — H90A32 Mixed conductive and sensorineural hearing loss, unilateral, left ear with restricted hearing on the contralateral side: Secondary | ICD-10-CM | POA: Diagnosis not present

## 2015-03-24 DIAGNOSIS — H7202 Central perforation of tympanic membrane, left ear: Secondary | ICD-10-CM | POA: Diagnosis not present

## 2015-04-20 ENCOUNTER — Other Ambulatory Visit: Payer: Self-pay | Admitting: Family Medicine

## 2015-04-20 DIAGNOSIS — M1712 Unilateral primary osteoarthritis, left knee: Secondary | ICD-10-CM | POA: Diagnosis not present

## 2015-04-20 DIAGNOSIS — M1711 Unilateral primary osteoarthritis, right knee: Secondary | ICD-10-CM | POA: Diagnosis not present

## 2015-04-20 DIAGNOSIS — M19012 Primary osteoarthritis, left shoulder: Secondary | ICD-10-CM | POA: Diagnosis not present

## 2015-04-20 DIAGNOSIS — M19011 Primary osteoarthritis, right shoulder: Secondary | ICD-10-CM | POA: Diagnosis not present

## 2015-04-21 ENCOUNTER — Other Ambulatory Visit: Payer: Self-pay | Admitting: Family Medicine

## 2015-04-23 ENCOUNTER — Other Ambulatory Visit: Payer: Self-pay

## 2015-04-23 DIAGNOSIS — M19011 Primary osteoarthritis, right shoulder: Secondary | ICD-10-CM

## 2015-04-23 MED ORDER — HYDROCODONE-ACETAMINOPHEN 7.5-325 MG PO TABS: 1.0000 | ORAL_TABLET | Freq: Four times a day (QID) | ORAL | Status: DC | PRN

## 2015-04-24 ENCOUNTER — Other Ambulatory Visit: Payer: Self-pay

## 2015-04-24 DIAGNOSIS — N183 Chronic kidney disease, stage 3 unspecified: Secondary | ICD-10-CM

## 2015-04-24 MED ORDER — FUROSEMIDE 20 MG PO TABS: 60.0000 mg | ORAL_TABLET | Freq: Every day | ORAL | Status: DC

## 2015-05-11 ENCOUNTER — Telehealth: Payer: Self-pay | Admitting: *Deleted

## 2015-05-11 DIAGNOSIS — N400 Enlarged prostate without lower urinary tract symptoms: Secondary | ICD-10-CM

## 2015-05-11 NOTE — Telephone Encounter (Signed)
Daughter in call and requests a referral to take with her to the patient's f/u appointment tomorrow. He has an appointment scheduled with Dr. Remer MachoNewsom who is a urologist that he sees for his prostate. Referral placed. Roberto Day would like an actual tangible piece of paper that has the referral info on it to take with her. rerferral paper placed up front

## 2015-05-12 DIAGNOSIS — N400 Enlarged prostate without lower urinary tract symptoms: Secondary | ICD-10-CM | POA: Diagnosis not present

## 2015-05-18 ENCOUNTER — Telehealth: Payer: Self-pay | Admitting: Family Medicine

## 2015-05-18 DIAGNOSIS — N183 Chronic kidney disease, stage 3 unspecified: Secondary | ICD-10-CM

## 2015-05-18 MED ORDER — FUROSEMIDE 20 MG PO TABS: 60.0000 mg | ORAL_TABLET | Freq: Every day | ORAL | Status: DC

## 2015-05-18 NOTE — Telephone Encounter (Signed)
Refill req 

## 2015-05-25 ENCOUNTER — Other Ambulatory Visit: Payer: Self-pay | Admitting: Family Medicine

## 2015-05-26 ENCOUNTER — Encounter: Payer: Self-pay | Admitting: Family Medicine

## 2015-05-26 ENCOUNTER — Ambulatory Visit (INDEPENDENT_AMBULATORY_CARE_PROVIDER_SITE_OTHER): Payer: Medicare Other | Admitting: Family Medicine

## 2015-05-26 VITALS — BP 121/76 | HR 84 | Wt 144.0 lb

## 2015-05-26 DIAGNOSIS — N400 Enlarged prostate without lower urinary tract symptoms: Secondary | ICD-10-CM

## 2015-05-26 DIAGNOSIS — N183 Chronic kidney disease, stage 3 unspecified: Secondary | ICD-10-CM

## 2015-05-26 DIAGNOSIS — M170 Bilateral primary osteoarthritis of knee: Secondary | ICD-10-CM

## 2015-05-26 DIAGNOSIS — I351 Nonrheumatic aortic (valve) insufficiency: Secondary | ICD-10-CM | POA: Diagnosis not present

## 2015-05-26 DIAGNOSIS — I700 Atherosclerosis of aorta: Secondary | ICD-10-CM | POA: Diagnosis not present

## 2015-05-26 MED ORDER — OXYCODONE-ACETAMINOPHEN 7.5-325 MG PO TABS: 1.0000 | ORAL_TABLET | Freq: Four times a day (QID) | ORAL | Status: DC | PRN

## 2015-05-26 MED ORDER — FUROSEMIDE 20 MG PO TABS: 60.0000 mg | ORAL_TABLET | Freq: Every day | ORAL | Status: DC

## 2015-05-26 NOTE — Progress Notes (Signed)
CC: Roberto SchlatterRobert J Notaro is a 82 y.o. male is here for Follow-up   Subjective: HPI:  Follow-up BPH: Taking Flomax on a daily basis. No urinary hesitancy urgency or incomplete voiding sensation. Denies any dysuria.  Follow-up aortic regurgitation: He is having to take 60 mg of Lasix every morning and 20 mg every afternoon. Provided he sticks to this regimen on a daily basis he denies any swelling in the lower extremities. He denies orthopnea or shortness of breath.  Follow-up atherosclerosis of the aorta: He wants to know if he can stop taking atorvastatin. Denies any abdominal pain or new back pain.  Follow-up osteoarthritis of the knees: He's been getting steroid injections every 2 months with his orthopedist however it only helps for a few weeks. He's already tried Visco supplementation.. No benefit from Aspercreme nor Pennsaid. He tells me that the pain is getting to the point where it's unbearable and it prevents him from getting up and doing things around the house. He denies any swelling redness or giving way of either knee. Both are equally painful   Review Of Systems Outlined In HPI  Past Medical History  Diagnosis Date  . Unintentional weight loss 10/07/2011  . BPH (benign prostatic hyperplasia) 10/07/2011  . Hematuria 10/07/2011  . Bladder stone 10/07/2011  . Aortic stenosis     and aortic insufficiency  . Hyperlipidemia   . Bladder infection     hx of  . GERD (gastroesophageal reflux disease)     only take medication as needed  . Arthritis     "bilateral knees and right shoulder"  . Hypertension     Dr. Laren BoomSean Drea Jurewicz, [place] primary care  . Pneumonia     "had it very frequently when I was a child" (12/05/2011)  . Nitrate poisoning 1945  . Stomach ulcer 1945    "caused by nitrate poisoning" (12/05/2011)  . Migraines     "went away inthe early 1940's" (12/05/2011)  . Tremors of nervous system     Past Surgical History  Procedure Laterality Date  . Circumcision      1996   . Inner ear surgery  1948    "left; perforated eardrum now" (12/05/2011)  . Inguinal hernia repair  12/05/2011    bilaterally  . Tonsillectomy  1946  . Inguinal hernia repair  12/05/2011    Procedure: HERNIA REPAIR INGUINAL ADULT BILATERAL;  Surgeon: Wilmon ArmsMatthew K. Corliss Skainssuei, MD;  Location: [facility] OR;  Service: General;  Laterality: Bilateral;  . Insertion of mesh  12/05/2011    Procedure: INSERTION OF MESH;  Surgeon: Wilmon ArmsMatthew K. Corliss Skainssuei, MD;  Location: [facility] OR;  Service: General;  Laterality: Bilateral;   Family History  Problem Relation Age of Onset  . Heart attack Mother   . Stroke Mother   . Cancer Mother     cervial and ovarian  . Diabetes Father   . Heart Problems Brother     ruptured heart    Social History   Social History  . Marital Status: Married    Spouse Name: N/A  . Number of Children: 3  . Years of Education: HS   Occupational History  . Retired    Social History Main Topics  . Smoking status: Former Smoker -- 0.50 packs/day for 5 years    Types: Cigarettes  . Smokeless tobacco: Never Used     Comment: 12/05/2011 "haven't smoked since age [age]"  . Alcohol Use: No  . Drug Use: No  . Sexual Activity: No  Other Topics Concern  . Not on file   Social History Narrative   Lives at home with his son.   Right-handed.   1 cup caffeine per daily.     Objective: BP 121/76 mmHg  Pulse 84  Wt 144 lb (65.318 kg)  General: Alert and Oriented, No Acute Distress HEENT: Pupils equal, round, reactive to light. Conjunctivae clear.  Moist mucous membranes Lungs: Clear to auscultation bilaterally, no wheezing/ronchi/rales.  Comfortable work of breathing. Good air movement. Cardiac: Regular rate and rhythm. Normal S1/S2.  No murmurs, rubs, nor gallops.   Extremities: No peripheral edema.  Strong peripheral pulses.  Mental Status: No depression, anxiety, nor agitation. Skin: Warm and dry.  Assessment & Plan: Daniele was seen today for follow-up.  Diagnoses and all orders for this  visit:  BPH (benign prostatic hyperplasia)  Aortic regurgitation  Atherosclerosis of aorta (HCC)  CKD (chronic kidney disease) stage 3, GFR 30-59 ml/min -     furosemide (LASIX) 20 MG tablet; Take 3 tablets (60 mg total) by mouth daily. May take 1 extra tablet daily as needed for swelling  Osteoarthritis of both knees, unspecified osteoarthritis type  Other orders -     oxyCODONE-acetaminophen (PERCOCET) 7.5-325 MG tablet; Take 1 tablet by mouth every 6 (six) hours as needed for moderate pain or severe pain.   BPH: Controlled with Flomax Aortic regurgitation: Stable and symptomatically controlled with current furosemide regimen Atherosclerosis of the aorta: Discussed the importance of staying on Lipitor to help prevent progression of the atherosclerosis.  Osteoarthritis of the knees: Changing pain medication regimen from hydrocodone to oxycodone. Continue to see Dr. pill   Return for Pain Follow Up.

## 2015-06-06 ENCOUNTER — Other Ambulatory Visit: Payer: Self-pay | Admitting: Family Medicine

## 2015-06-18 ENCOUNTER — Other Ambulatory Visit: Payer: Self-pay | Admitting: Family Medicine

## 2015-06-19 DIAGNOSIS — M19012 Primary osteoarthritis, left shoulder: Secondary | ICD-10-CM | POA: Diagnosis not present

## 2015-06-19 DIAGNOSIS — M19011 Primary osteoarthritis, right shoulder: Secondary | ICD-10-CM | POA: Diagnosis not present

## 2015-06-19 DIAGNOSIS — M25562 Pain in left knee: Secondary | ICD-10-CM | POA: Diagnosis not present

## 2015-06-19 DIAGNOSIS — M25561 Pain in right knee: Secondary | ICD-10-CM | POA: Diagnosis not present

## 2015-06-22 ENCOUNTER — Other Ambulatory Visit: Payer: Self-pay

## 2015-06-22 MED ORDER — OXYCODONE-ACETAMINOPHEN 7.5-325 MG PO TABS: 1.0000 | ORAL_TABLET | Freq: Four times a day (QID) | ORAL | Status: DC | PRN

## 2015-06-22 NOTE — Telephone Encounter (Signed)
Refilled oxycodone and advised he will need a follow up visit in August.

## 2015-06-24 ENCOUNTER — Other Ambulatory Visit: Payer: Self-pay | Admitting: Family Medicine

## 2015-06-26 ENCOUNTER — Other Ambulatory Visit: Payer: Self-pay

## 2015-06-26 MED ORDER — OXYCODONE-ACETAMINOPHEN 7.5-325 MG PO TABS: 1.0000 | ORAL_TABLET | Freq: Four times a day (QID) | ORAL | Status: DC | PRN

## 2015-06-26 NOTE — Telephone Encounter (Signed)
Daughter in law notified.

## 2015-06-26 NOTE — Telephone Encounter (Signed)
When daughter in-law went to fill Mr. Lorre MunroeJonas oxycodone Rx at [facility] [address] here [place] the only had 110 pill to give and not 120.  They told her to have us write another Rx for just the 10 they were short of and they'll fill it on Tues. when they get more.

## 2015-06-26 NOTE — Telephone Encounter (Signed)
[place], Rx placed in in-box ready for pickup/faxing.  

## 2015-07-21 ENCOUNTER — Other Ambulatory Visit: Payer: Self-pay

## 2015-07-21 DIAGNOSIS — M549 Dorsalgia, unspecified: Principal | ICD-10-CM

## 2015-07-21 DIAGNOSIS — G8929 Other chronic pain: Secondary | ICD-10-CM

## 2015-07-21 MED ORDER — OXYCODONE-ACETAMINOPHEN 7.5-325 MG PO TABS: 1.0000 | ORAL_TABLET | Freq: Four times a day (QID) | ORAL | Status: DC | PRN

## 2015-07-22 ENCOUNTER — Other Ambulatory Visit: Payer: Self-pay | Admitting: Family Medicine

## 2015-07-31 DIAGNOSIS — M19011 Primary osteoarthritis, right shoulder: Secondary | ICD-10-CM | POA: Diagnosis not present

## 2015-07-31 DIAGNOSIS — M1711 Unilateral primary osteoarthritis, right knee: Secondary | ICD-10-CM | POA: Diagnosis not present

## 2015-07-31 DIAGNOSIS — M19012 Primary osteoarthritis, left shoulder: Secondary | ICD-10-CM | POA: Diagnosis not present

## 2015-07-31 DIAGNOSIS — M1712 Unilateral primary osteoarthritis, left knee: Secondary | ICD-10-CM | POA: Diagnosis not present

## 2015-08-03 ENCOUNTER — Other Ambulatory Visit: Payer: Self-pay | Admitting: Family Medicine

## 2015-08-03 DIAGNOSIS — N183 Chronic kidney disease, stage 3 unspecified: Secondary | ICD-10-CM

## 2015-08-06 ENCOUNTER — Encounter: Payer: Self-pay | Admitting: Pulmonary Disease

## 2015-08-06 ENCOUNTER — Ambulatory Visit (INDEPENDENT_AMBULATORY_CARE_PROVIDER_SITE_OTHER): Payer: Medicare Other | Admitting: Pulmonary Disease

## 2015-08-06 DIAGNOSIS — R1013 Epigastric pain: Secondary | ICD-10-CM

## 2015-08-06 DIAGNOSIS — J8410 Pulmonary fibrosis, unspecified: Secondary | ICD-10-CM | POA: Diagnosis not present

## 2015-08-06 NOTE — Assessment & Plan Note (Signed)
Take Zantac twice daily for 5 days until abdominal pains better then back to once daily

## 2015-08-06 NOTE — Assessment & Plan Note (Signed)
Fibrosis appears stable Oxygenation appears stable -

## 2015-08-06 NOTE — Patient Instructions (Signed)
Fibrosis appears stable Take Zantac twice daily for 5 days until abdominal pains better then back to once daily

## 2015-08-06 NOTE — Progress Notes (Signed)
   Subjective:    Patient ID: Roberto Day, male    DOB: 03/22/1921, 82 y.o.   MRN: [id number]  HPI  82 y.o remote smoker (quit in 20's) for FU of RML pulmonary nodule & ILD & mod AS  He is wheelchair bound due to bad knees.  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 Follow-up    spit up blood a few days ago, chest discomfort.     Accompanied by daughter-in-law He is in a wheelchair, hence Dyspnea is difficult to estimate due to his limited mobility, he can walk around the house Breathing is at baseline -He reports epigastric abdominal pain and nonradiating, worse with meals  He reports one episode of spitting up blood 4 days ago has not recurred, he feels this is due to medications  Pedal edema is controlled with 4 tablets of Lasix   Significant tests/ events    Echo 11/2012 - normal LV function. There was mild aortic stenosis and moderate aortic insufficiency. There was mild to moderate mitral stenosis, nml RVSF .    CT abdomen for renal stone in 10/2011 - ILD & RLL pleural based 3 mm nodule   CT chest 11/2012 showed underlying interstitial lung Disease. There was a a 1.1 x 0.7 x 2.0 cm elongated nodular opacity in the posterior aspect of the right middle lobe abutting the major fissure.  GGO BUL suggestive of an active atypical infectious process.   05/08/2013 CT chest - RML pulmonary nodule previously noted along the right major fissure has resolved. RUL nodule has increased slightly in size. Interim improvement in nodular ill-defined infiltrates . - mild increase in peribronchial interstitial changes noted throughout both lungs Large sliding hiatal hernia. ? component of aspiration pneumonitis   CT 11/2013 chronic pulmonary scarring changes with new areas of inflammation most notable in the right upper lobe posteriorly, RLL nodule  faint -improved    Review of Systems neg for any significant sore throat, dysphagia, itching, sneezing, nasal congestion or excess/ purulent secretions, fever, chills, sweats, unintended wt loss, pleuritic or exertional cp, hempoptysis, orthopnea pnd or change in chronic leg swelling. Also denies presyncope, palpitations, heartburn, abdominal pain, nausea, vomiting, diarrhea or change in bowel or urinary habits, dysuria,hematuria, rash, arthralgias, visual complaints, headache, numbness weakness or ataxia.     Objective:   Physical Exam  Gen. Pleasant, well-nourished, in no distress ENT - no lesions, no post nasal drip Neck: No JVD, no thyromegaly, no carotid bruits Lungs: no use of accessory muscles, no dullness to percussion,bibasal rales, no rhonchi  Cardiovascular: Rhythm regular, heart sounds  normal, no murmurs or gallops, no peripheral edema Musculoskeletal: No deformities, no cyanosis or clubbing        Assessment & Plan:

## 2015-08-19 ENCOUNTER — Telehealth: Payer: Self-pay | Admitting: Emergency Medicine

## 2015-08-19 DIAGNOSIS — M549 Dorsalgia, unspecified: Principal | ICD-10-CM

## 2015-08-19 DIAGNOSIS — G8929 Other chronic pain: Secondary | ICD-10-CM

## 2015-08-19 MED ORDER — OXYCODONE-ACETAMINOPHEN 7.5-325 MG PO TABS: 1.0000 | ORAL_TABLET | Freq: Four times a day (QID) | ORAL | 0 refills | Status: DC | PRN

## 2015-08-19 NOTE — Telephone Encounter (Signed)
Daughter notified 

## 2015-08-19 NOTE — Telephone Encounter (Signed)
Patient's daughter-in-law calling for refill of Percoset for patient; left VM that this would be dealth with on 08-21-15, when next due,  and left for pick up unless otherwise notified. pak 

## 2015-08-19 NOTE — Telephone Encounter (Signed)
[place], Rx placed in in-box ready for pickup/faxing.  

## 2015-08-26 ENCOUNTER — Ambulatory Visit (INDEPENDENT_AMBULATORY_CARE_PROVIDER_SITE_OTHER): Payer: Medicare Other | Admitting: Family Medicine

## 2015-08-26 ENCOUNTER — Encounter: Payer: Self-pay | Admitting: Family Medicine

## 2015-08-26 VITALS — BP 122/76 | HR 90 | Wt 137.0 lb

## 2015-08-26 DIAGNOSIS — N400 Enlarged prostate without lower urinary tract symptoms: Secondary | ICD-10-CM | POA: Diagnosis not present

## 2015-08-26 DIAGNOSIS — Z23 Encounter for immunization: Secondary | ICD-10-CM

## 2015-08-26 DIAGNOSIS — M170 Bilateral primary osteoarthritis of knee: Secondary | ICD-10-CM | POA: Diagnosis not present

## 2015-08-26 DIAGNOSIS — R1013 Epigastric pain: Secondary | ICD-10-CM

## 2015-08-26 MED ORDER — RANITIDINE HCL 150 MG PO TABS: 150.0000 mg | ORAL_TABLET | Freq: Two times a day (BID) | ORAL | 7 refills | Status: DC

## 2015-08-26 NOTE — Progress Notes (Signed)
CC: Roberto Day is a 82 y.o. male is here for Follow-up   Subjective: HPI:  Follow-up BPH: Taking Flomax and saw palmetto daily with no urinary complaints today. He denies weak stream or sensation of incomplete voiding.  Follow-up osteoarthritis of the knees: He is currently getting Visco-supplementation with Dr. pill. He believes that switching from hydrocodone to oxycodone has made a big difference with reducing some of his pain in the knees. He denies any swelling redness or warmth of either knee.  Follow-up epigastric pain: He is requesting a refill on ranitidine. Provided he takes this on a daily basis he denies any epigastric pain or reflux symptoms.  Review Of Systems Outlined In HPI  Past Medical History:  Diagnosis Date  . Aortic stenosis    and aortic insufficiency  . Arthritis    "bilateral knees and right shoulder"  . Bladder infection    hx of  . Bladder stone 10/07/2011  . BPH (benign prostatic hyperplasia) 10/07/2011  . GERD (gastroesophageal reflux disease)    only take medication as needed  . Hematuria 10/07/2011  . Hyperlipidemia   . Hypertension    Dr. Laren BoomSean Declan Adamson, [facility]  . Migraines    "went away inthe early 1940's" (12/05/2011)  . Nitrate poisoning 1945  . Pneumonia    "had it very frequently when I was a child" (12/05/2011)  . Stomach ulcer 1945   "caused by nitrate poisoning" (12/05/2011)  . Tremors of nervous system   . Unintentional weight loss 10/07/2011    Past Surgical History:  Procedure Laterality Date  . CIRCUMCISION     1996  . INGUINAL HERNIA REPAIR  12/05/2011   bilaterally  . INGUINAL HERNIA REPAIR  12/05/2011   Procedure: HERNIA REPAIR INGUINAL ADULT BILATERAL;  Surgeon: Wilmon ArmsMatthew K. Corliss Skainssuei, MD;  Location: [facility] OR;  Service: General;  Laterality: Bilateral;  . INNER EAR SURGERY  1948   "left; perforated eardrum now" (12/05/2011)  . INSERTION OF MESH  12/05/2011   Procedure: INSERTION OF MESH;  Surgeon: Wilmon ArmsMatthew K. Corliss Skainssuei, MD;   Location: [facility] OR;  Service: General;  Laterality: Bilateral;  . TONSILLECTOMY  1946   Family History  Problem Relation Age of Onset  . Heart attack Mother   . Stroke Mother   . Cancer Mother     cervial and ovarian  . Diabetes Father   . Heart Problems Brother     ruptured heart    Social History   Social History  . Marital status: Married    Spouse name: N/A  . Number of children: 3  . Years of education: HS   Occupational History  . Retired    Social History Main Topics  . Smoking status: Former Smoker    Packs/Day: 0.50    Years: 5.00    Types: Cigarettes  . Smokeless tobacco: Never Used     Comment: 12/05/2011 "haven't smoked since age [age]"  . Alcohol use No  . Drug use: No  . Sexual activity: No   Other Topics Concern  . Not on file   Social History Narrative   Lives at home with his son.   Right-handed.   1 cup caffeine per daily.     Objective: BP 122/76   Pulse 90   Wt 137 lb (62.1 kg)   BMI 19.66 kg/m   Vital signs reviewed. General: Alert and Oriented, No Acute Distress HEENT: Pupils equal, round, reactive to light. Conjunctivae clear.  External ears unremarkable.  Moist  mucous membranes. Lungs: Clear and comfortable work of breathing, speaking in full sentences without accessory muscle use. Cardiac: Regular rate and rhythm. Unchanged diastolic murmur Neuro: CN II-XII grossly intact, gait normal. Extremities: No peripheral edema.  Strong peripheral pulses.  Mental Status: No depression, anxiety, nor agitation. Logical though process. Skin: Warm and dry.  Assessment & Plan: Roberto MaduroRobert was seen today for follow-up.  Diagnoses and all orders for this visit:  BPH (benign prostatic hyperplasia)  Osteoarthritis of both knees, unspecified osteoarthritis type  Epigastric pain  Other orders -     ranitidine (ZANTAC) 150 MG tablet; Take 1 tablet (150 mg total) by mouth 2 (two) times daily.  BPH: Control with Flomax and saw  palmetto Osteoarthritis: Improved with Percocet, continuing current regimen Epigastric discomfort: Resolved with daily ranitidine use.  Discussed with this patient that I will be resigning from my position here with [facility] in September in order to stay with my family who will be moving to [place]. I let him know about the providers that are still accepting patients and I feel that this individual will be under great care if he/she stays here with [facility].   Return in about 3 months (around 11/26/2015).

## 2015-08-26 NOTE — Addendum Note (Signed)
Addended by: Thom ChimesHENRY, Tierria Watson M on: 08/26/2015 11:24 AM   Modules accepted: Orders

## 2015-08-26 NOTE — Patient Instructions (Signed)
New nasal spray options: Nasacort or Flonase, all over the counter.

## 2015-08-27 DIAGNOSIS — M19012 Primary osteoarthritis, left shoulder: Secondary | ICD-10-CM | POA: Diagnosis not present

## 2015-08-27 DIAGNOSIS — M19011 Primary osteoarthritis, right shoulder: Secondary | ICD-10-CM | POA: Diagnosis not present

## 2015-08-27 DIAGNOSIS — M25561 Pain in right knee: Secondary | ICD-10-CM | POA: Diagnosis not present

## 2015-08-27 DIAGNOSIS — M170 Bilateral primary osteoarthritis of knee: Secondary | ICD-10-CM | POA: Diagnosis not present

## 2015-08-27 DIAGNOSIS — M25562 Pain in left knee: Secondary | ICD-10-CM | POA: Diagnosis not present

## 2015-09-03 ENCOUNTER — Other Ambulatory Visit: Payer: Self-pay | Admitting: Family Medicine

## 2015-09-03 DIAGNOSIS — N183 Chronic kidney disease, stage 3 unspecified: Secondary | ICD-10-CM

## 2015-09-04 DIAGNOSIS — M1712 Unilateral primary osteoarthritis, left knee: Secondary | ICD-10-CM | POA: Diagnosis not present

## 2015-09-04 DIAGNOSIS — M1711 Unilateral primary osteoarthritis, right knee: Secondary | ICD-10-CM | POA: Diagnosis not present

## 2015-09-07 ENCOUNTER — Other Ambulatory Visit: Payer: Self-pay | Admitting: Family Medicine

## 2015-09-11 DIAGNOSIS — M1711 Unilateral primary osteoarthritis, right knee: Secondary | ICD-10-CM | POA: Diagnosis not present

## 2015-09-11 DIAGNOSIS — M25561 Pain in right knee: Secondary | ICD-10-CM | POA: Diagnosis not present

## 2015-09-11 DIAGNOSIS — M1712 Unilateral primary osteoarthritis, left knee: Secondary | ICD-10-CM | POA: Diagnosis not present

## 2015-09-11 DIAGNOSIS — M25562 Pain in left knee: Secondary | ICD-10-CM | POA: Diagnosis not present

## 2015-09-22 ENCOUNTER — Other Ambulatory Visit: Payer: Self-pay | Admitting: Family Medicine

## 2015-09-22 ENCOUNTER — Other Ambulatory Visit: Payer: Self-pay

## 2015-09-22 DIAGNOSIS — M549 Dorsalgia, unspecified: Principal | ICD-10-CM

## 2015-09-22 DIAGNOSIS — G8929 Other chronic pain: Secondary | ICD-10-CM

## 2015-09-22 MED ORDER — OXYCODONE-ACETAMINOPHEN 7.5-325 MG PO TABS: 1.0000 | ORAL_TABLET | Freq: Four times a day (QID) | ORAL | 0 refills | Status: DC | PRN

## 2015-10-04 ENCOUNTER — Other Ambulatory Visit: Payer: Self-pay | Admitting: Family Medicine

## 2015-10-04 DIAGNOSIS — N183 Chronic kidney disease, stage 3 unspecified: Secondary | ICD-10-CM

## 2015-10-09 DIAGNOSIS — M19011 Primary osteoarthritis, right shoulder: Secondary | ICD-10-CM | POA: Diagnosis not present

## 2015-10-09 DIAGNOSIS — M19012 Primary osteoarthritis, left shoulder: Secondary | ICD-10-CM | POA: Diagnosis not present

## 2015-10-09 DIAGNOSIS — M25562 Pain in left knee: Secondary | ICD-10-CM | POA: Diagnosis not present

## 2015-10-09 DIAGNOSIS — M25561 Pain in right knee: Secondary | ICD-10-CM | POA: Diagnosis not present

## 2015-10-12 ENCOUNTER — Encounter: Payer: Self-pay | Admitting: Sports Medicine

## 2015-10-12 ENCOUNTER — Ambulatory Visit (INDEPENDENT_AMBULATORY_CARE_PROVIDER_SITE_OTHER): Payer: Medicare Other | Admitting: Sports Medicine

## 2015-10-12 DIAGNOSIS — M7989 Other specified soft tissue disorders: Secondary | ICD-10-CM | POA: Diagnosis not present

## 2015-10-12 DIAGNOSIS — Z9889 Other specified postprocedural states: Secondary | ICD-10-CM | POA: Diagnosis not present

## 2015-10-12 DIAGNOSIS — Z8719 Personal history of other diseases of the digestive system: Secondary | ICD-10-CM

## 2015-10-12 NOTE — Assessment & Plan Note (Signed)
Patient gets some suprapubic fullness, I do not appreciate any hernias at rest or with Valsalva, I'm going to have him touch base with Gen. surgery again, Dr. Margaree Mackintoshsui.

## 2015-10-12 NOTE — Assessment & Plan Note (Addendum)
Lower extreme Dopplers, we do likely just need to increase his dose of Lasix. He did have a positive Homans sign and with unilateral swelling need to rule out a DVT first.  There are Baker's cysts behind both knees, these can be aspirated and injected, he should make an appointment to see me. No evidence of DVT however. Also he can go ahead and take his furosemide to help get rate of some of the fluid, needs to also get high compression lower extremity compression stockings.

## 2015-10-12 NOTE — Progress Notes (Signed)
  Subjective:    CC: R-sided inguinal hernia   HPI: 82 yo M with history of  Mitral stenosis, aortic regurge, bilateral inguinal hernia repair presenting for evaluation of recurrent hernia.  Patient says for the past two months, his R sided hernia has recurred with some intermittent bulging with activity. He says hernia is reducible.  This past weekend he was lifting a box and felt the hernia bulge with some associated pain.  He says it reduced on its own, but he feels like he continues to have some swelling.  He is hoping to get an evaluation of his hernia to see if he needs it repaired again.   He has also fallen two times in the past two weeks.  He has not hit his head and denies LOC during these falls.  Since his fall he has had leg swelling, R>L.  He takes Lasix 20mg  daily and has not increased his dose since his legs began to swell.    Past medical history:  Negative.  See flowsheet/record as well for more information.  Surgical history: Negative.  See flowsheet/record as well for more information.  Family history: Negative.  See flowsheet/record as well for more information.  Social history: Negative.  See flowsheet/record as well for more information.  Allergies, and medications have been entered into the medical record, reviewed, and no changes needed.   Review of Systems: No fevers, chills, night sweats, weight loss, chest pain, or shortness of breath.   Objective:    General: Well Developed, well nourished, and in no acute distress.  Neuro: Alert and oriented x3, extra-ocular muscles intact, sensation grossly intact.  HEENT: Normocephalic, atraumatic, pupils equal round reactive to light, neck supple, no masses, no lymphadenopathy, thyroid nonpalpable.  Skin: Warm and dry. 2+ lower extremity edema to knees, R>L.  Positive Homan's sign on R.  Cardiac: Regular rate. 3/5 diastolic ejection murmur over left lower sternal border.  No rubs or gallops, 2+ lower extremity edema to knees,  R>L.  Respiratory: Clear to auscultation bilaterally. Not using accessory muscles, speaking in full sentences. GU: Cannot appreciate inguinal hernia on R side with Valsalva or at rest.    Impression and Recommendations:    1. R-sided inguinal bulging and pain: cannot appreciate hernia or bulging on exam -Will refer to Dr. Margaree Mackintoshsui for second opinion  2. R sided peripheral edema: Given asymetric swelling and positive Homan's sign, would like to workup for DVT.   -Doppler US tomorrow.  If positive, will anticoagulate.  If negative, will increase Lasix to 40mg  daily until swelling subsides.

## 2015-10-13 ENCOUNTER — Ambulatory Visit (HOSPITAL_BASED_OUTPATIENT_CLINIC_OR_DEPARTMENT_OTHER)
Admission: RE | Admit: 2015-10-13 | Discharge: 2015-10-13 | Disposition: A | Discharge status: Home / Self Care | Payer: Medicare Other | Source: Ambulatory Visit | Attending: Sports Medicine | Admitting: Sports Medicine

## 2015-10-13 DIAGNOSIS — M7989 Other specified soft tissue disorders: Secondary | ICD-10-CM | POA: Diagnosis present

## 2015-10-21 ENCOUNTER — Other Ambulatory Visit: Payer: Self-pay

## 2015-10-21 MED ORDER — OXYCODONE-ACETAMINOPHEN 7.5-325 MG PO TABS: 1.0000 | ORAL_TABLET | Freq: Four times a day (QID) | ORAL | 0 refills | Status: DC | PRN

## 2015-10-26 ENCOUNTER — Ambulatory Visit: Payer: Self-pay | Admitting: Surgery

## 2015-10-26 DIAGNOSIS — K4091 Unilateral inguinal hernia, without obstruction or gangrene, recurrent: Secondary | ICD-10-CM | POA: Diagnosis not present

## 2015-10-26 NOTE — H&P (Signed)
History of Present Illness Roberto Day. Roberto Bowens MD; 10/26/2015 12:43 PM) Patient words: hernia.  The patient is a 82 year old male who presents with an inguinal hernia. Referred by Dr. Benjamin Stain for recurrent right inguinal hernia  This is a 82 year old male who is 4 years status post open repair of bilateral inguinal hernias. He had a large direct left inguinal hernia that was repaired with ultra Pro mesh. He had a large indirect hernia on the right that was repaired with ultra Pro mesh. The mesh was secured with 2-0 Prolene. The patient had been doing well until earlier this year. Even though he is fairly unsteady on his feet, he was lifting some boxes. He also has a chronic cough. He noticed some bulging and discomfort in his right groin. This has enlarged. He denies any obstructive symptoms. He has not had any imaging of this area. He was examined by his primary care physician felt that he might have a recurrent hernia. The patient is in a wheelchair and is accompanied by family member. He is able to stand up with some assistance.   Other Problems (Roberto Eversole, LPN; 09/81/1914 10:59 AM) Arthritis Back Pain Cholelithiasis Enlarged Prostate Gastric Ulcer Heart murmur Hemorrhoids Hypercholesterolemia Inguinal Hernia Migraine Headache Umbilical Hernia Repair Ventral Hernia Repair  Past Surgical History (Roberto Eversole, LPN; 78/29/5621 10:59 AM) Cataract Surgery Bilateral. Tonsillectomy  Allergies (Roberto Eversole, LPN; 30/86/5784 11:01 AM) CeleBREX *ANALGESICS - ANTI-INFLAMMATORY*  Medication History (Roberto Eversole, LPN; 69/62/9528 11:01 AM) Timolol Maleate (0.25% Solution, Ophthalmic) Active. Tamsulosin HCl (0.4MG  Capsule, Oral) Active. RaNITidine HCl (150MG  Tablet, Oral) Active. Gabapentin (300MG  Capsule, Oral) Active. Furosemide (20MG  Tablet, Oral) Active. Atorvastatin Calcium (10MG  Tablet, Oral) Active. TraMADol HCl (50MG  Tablet, Oral)  Active. Oxycodone-Acetaminophen (7.5-325MG  Tablet, Oral) Active. Medications Reconciled  Social History (Roberto Eversole, LPN; 41/32/4401 10:59 AM) Alcohol use Occasional alcohol use. Caffeine use Coffee. Tobacco use Former smoker.  Family History (Roberto Pilling, LPN; 02/72/5366 10:59 AM) Arthritis Father, Mother. Cervical Cancer Mother. Depression Mother. Diabetes Mellitus Father, Mother. Family history unknown First Degree Relatives Heart Disease Brother. Heart disease in male family member before age 8 Hypertension Brother. Migraine Headache Brother, Mother. Seizure disorder Mother.     Review of Systems (Roberto Eversole LPN; 44/03/4740 10:59 AM) General Present- Weight Loss. Not Present- Appetite Loss, Chills, Fatigue, Fever, Night Sweats and Weight Gain. Skin Not Present- Change in Wart/Mole, Dryness, Hives, Jaundice, New Lesions, Non-Healing Wounds, Rash and Ulcer. HEENT Present- Earache and Visual Disturbances. Not Present- Hearing Loss, Hoarseness, Nose Bleed, Oral Ulcers, Ringing in the Ears, Seasonal Allergies, Sinus Pain, Sore Throat, Wears glasses/contact lenses and Yellow Eyes. Respiratory Present- Chronic Cough. Not Present- Bloody sputum, Difficulty Breathing, Snoring and Wheezing. Breast Not Present- Breast Mass, Breast Pain, Nipple Discharge and Skin Changes. Cardiovascular Present- Leg Cramps and Swelling of Extremities. Not Present- Chest Pain, Difficulty Breathing Lying Down, Palpitations, Rapid Heart Rate and Shortness of Breath. Gastrointestinal Present- Abdominal Pain, Difficulty Swallowing, Excessive gas, Gets full quickly at meals and Hemorrhoids. Not Present- Bloating, Bloody Stool, Change in Bowel Habits, Chronic diarrhea, Constipation, Indigestion, Nausea, Rectal Pain and Vomiting. Male Genitourinary Present- Frequency and Impotence. Not Present- Blood in Urine, Change in Urinary Stream, Nocturia, Painful Urination, Urgency and Urine  Leakage. Musculoskeletal Present- Back Pain, Joint Pain, Joint Stiffness, Muscle Weakness and Swelling of Extremities. Not Present- Muscle Pain. Neurological Present- Decreased Memory, Headaches and Trouble walking. Not Present- Fainting, Numbness, Seizures, Tingling, Tremor and Weakness. Psychiatric Present- Anxiety and Change in Sleep Pattern. Not Present- Bipolar, Depression,  Fearful and Frequent crying. Endocrine Not Present- Cold Intolerance, Excessive Hunger, Hair Changes, Heat Intolerance, Hot flashes and New Diabetes. Hematology Present- Easy Bruising. Not Present- Blood Thinners, Excessive bleeding, Gland problems, HIV and Persistent Infections.  Vitals (Roberto Eversole LPN; 10/26/2015 11:00 AM) 10/26/2015 11:00 AM Weight: 141.2 lb Height: 63in Body Surfa16/10/9604ce Area: 1.67 m Body Mass Index: 25.01 kg/m  Temp.: 68F(Oral)  Pulse: 48 (Regular)  BP: 110/60 (Sitting, Left Arm, Standard)      Physical Exam [place](Maurine Mowbray K. Neveyah Garzon MD; 10/26/2015 12:44 PM)  The physical exam findings are as follows: Note:WDWN in NAD Eyes: Pupils equal, round; sclera anicteric HENT: Oral mucosa moist; good dentition Neck: No masses palpated, no thyromegaly Lungs: CTA bilaterally; normal respiratory effort CV: Regular rate and rhythm; no murmurs; extremities well-perfused with no edema Abd: +bowel sounds, soft, non-tender, no palpable organomegaly; no palpable hernias GU: bilateral descended testes; no testicular masses; visible reducible right inguinal hernia; no sign of left inguinal hernia; well-healed incisions Skin: Warm, dry; no sign of jaundice Psychiatric - alert and oriented x 4; calm mood and affect    Assessment & Plan [place](Breck Hollinger K. Koralyn Prestage MD; 10/26/2015 11:28 AM)  RECURRENT RIGHT INGUINAL HERNIA (K40.91)  Current Plans Schedule for Surgery - Right inguinal hernia repair with mesh. The surgical procedure has been discussed with the patient. Potential risks, benefits, alternative  treatments, and expected outcomes have been explained. All of the patient's questions at this time have been answered. The likelihood of reaching the patient's treatment goal is good. The patient understand the proposed surgical procedure and wishes to proceed.   Cardiac clearance by Dr. Glade Stanfordrenshaw  Ahyan Kreeger K. Corliss Skainssuei, MD, [facility]  General/ Trauma Surgery  10/26/2015 12:44 PM

## 2015-10-27 ENCOUNTER — Encounter: Payer: Self-pay | Admitting: Cardiology

## 2015-10-27 NOTE — Progress Notes (Signed)
HPI: FU aortic insufficiency. Echocardiogram repeated May 2016. Ejection fraction was 65-70%. There was moderate aortic stenosis (mean gradient 25 mmHg) and mild to moderate aortic insufficiency. There was moderate mitral stenosis (MVA 1.49 cm2) and mild mitral regurgitation. There was mild left atrial enlargement and mildly elevated pulmonary pressures. Since last seen, patient has limited mobility and predominantly stays in his wheelchair. He denies dyspnea, exertional chest pain or syncope. He has mild pedal edema.  Current Outpatient Prescriptions  Medication Sig Dispense Refill  . AMBULATORY NON FORMULARY MEDICATION Collapsible light weight wheelchair.  Use daily as needed for mobility.  Dx: Osteoarthritis 1 Units 0  . atorvastatin (LIPITOR) 10 MG tablet TAKE ONE TABLET BY MOUTH ONCE DAILY 90 tablet 0  . betaxolol (BETOPTIC-S) 0.25 % ophthalmic suspension Place 1 drop into both eyes 2 (two) times daily. 5 mL 3  . cetirizine (ZYRTEC) 10 MG tablet Take 1 tablet (10 mg total) by mouth daily. 30 tablet 11  . Docusate Sodium (COLACE PO) Take 1 capsule by mouth daily.     . furosemide (LASIX) 20 MG tablet TAKE THREE TABLETS BY MOUTH ONCE DAILY. MAY  TAKE  1  EXTRA  TABLET  BY  MOUTH  DAILY  AS  NEEDED  FOR  SWELLING 120 tablet 0  . Multiple Vitamins-Minerals (CENTRUM SILVER PO) Take 1 tablet by mouth daily.     . Multiple Vitamins-Minerals (PRESERVISION AREDS PO) Take 1 capsule by mouth 2 (two) times daily.     Marland Kitchen oxyCODONE-acetaminophen (PERCOCET) 7.5-325 MG tablet Take 1 tablet by mouth every 6 (six) hours as needed for moderate pain or severe pain. 120 tablet 0  . Potassium Gluconate 550 MG TABS Take 1 tablet (550 mg total) by mouth daily. 90 each 1  . ranitidine (ZANTAC) 150 MG tablet Take 1 tablet (150 mg total) by mouth 2 (two) times daily. 60 tablet 7  . Saw Palmetto, Serenoa repens, (SAW PALMETTO PO) Take 1 tablet by mouth daily.     . tamsulosin (FLOMAX) 0.4 MG CAPS capsule TAKE TWO  CAPSULES BY MOUTH ONCE DAILY 180 capsule 2  . traMADol (ULTRAM) 50 MG tablet TAKE ONE TABLET BY MOUTH EVERY 8 HOURS AS NEEDED 90 tablet 0   No current facility-administered medications for this visit.      Past Medical History:  Diagnosis Date  . Aortic stenosis    and aortic insufficiency  . Arthritis    "bilateral knees and right shoulder"  . Bladder infection    hx of  . Bladder stone 10/07/2011  . BPH (benign prostatic hyperplasia) 10/07/2011  . GERD (gastroesophageal reflux disease)    only take medication as needed  . Hematuria 10/07/2011  . Hyperlipidemia   . Hypertension    Dr. Laren Boom, [facility]  . Migraines    "went away inthe early 1940's" (12/05/2011)  . Nitrate poisoning 1945  . Pneumonia    "had it very frequently when I was a child" (12/05/2011)  . Stomach ulcer 1945   "caused by nitrate poisoning" (12/05/2011)  . Tremors of nervous system   . Unintentional weight loss 10/07/2011    Past Surgical History:  Procedure Laterality Date  . CIRCUMCISION     1996  . INGUINAL HERNIA REPAIR  12/05/2011   bilaterally  . INGUINAL HERNIA REPAIR  12/05/2011   Procedure: HERNIA REPAIR INGUINAL ADULT BILATERAL;  Surgeon: [name]. [name], MD;  Location: [facility] OR;  Service: General;  Laterality: Bilateral;  .  INNER EAR SURGERY  1948   "left; perforated eardrum now" (12/05/2011)  . INSERTION OF MESH  12/05/2011   Procedure: INSERTION OF MESH;  Surgeon: Wilmon ArmsMatthew K. Corliss Skainssuei, MD;  Location: [facility] OR;  Service: General;  Laterality: Bilateral;  . TONSILLECTOMY  1946    Social History   Social History  . Marital status: Married    Spouse name: N/A  . Number of children: 3  . Years of education: HS   Occupational History  . Retired    Social History Main Topics  . Smoking status: Former Smoker    Packs/day: 0.50    Years: 5.00    Types: Cigarettes  . Smokeless tobacco: Never Used     Comment: 12/05/2011 "haven't smoked since age 923-26"  . Alcohol use No  .  Drug use: No  . Sexual activity: No   Other Topics Concern  . Not on file   Social History Narrative   Lives at home with his son.   Right-handed.   1 cup caffeine per daily.    Family History  Problem Relation Age of Onset  . Heart attack Mother   . Stroke Mother   . Cancer Mother     cervial and ovarian  . Diabetes Father   . Heart Problems Brother     ruptured heart    ROS: knee pain but no fevers or chills, productive cough, hemoptysis, dysphasia, odynophagia, melena, hematochezia, dysuria, hematuria, rash, seizure activity, orthopnea, PND, claudication. Remaining systems are negative.  Physical Exam: Well-developed frail in no acute distress.  Skin is warm and dry.  HEENT is normal.  Neck is supple.  Chest is clear to auscultation with normal expansion.  Cardiovascular exam is regular rate and rhythm. 3/6 systolic murmur left sternal border. Abdominal exam nontender or distended. No masses palpated. Extremities show 1+ ankle edema. neuro grossly intact  ECG-Sinus rhythm with PACs. Left anterior fascicular block.  A/P  1 Hyperlipidemia-continue statin. Check lipids and liver.   2 aortic stenosis/aortic insufficiency/mitral stenosis-repeat echocardiogram.Continue present dose of Lasix. He can take an additional 20 mg daily as needed. Check potassium and renal function.   3 preoperative evaluation- Patient is scheduled for inguinal hernia repair. He has very limited mobility and therefore functional status cannot be assessed. He has a history of significant valvular disease. I will repeat echocardiogram. Given his age and valvular heart disease I would consider him high risk for any surgery. He does not want further cardiac testing and would never consider TAVR. I think this is reasonable. He therefore may proceed with surgery with the understanding that he is high risk for complications. Patient understands this and is in agreement.   Olga MillersBrian Crenshaw, MD

## 2015-10-28 ENCOUNTER — Encounter: Payer: Self-pay | Admitting: Cardiology

## 2015-10-28 ENCOUNTER — Ambulatory Visit: Payer: Medicare Other | Admitting: Cardiology

## 2015-10-28 ENCOUNTER — Ambulatory Visit (INDEPENDENT_AMBULATORY_CARE_PROVIDER_SITE_OTHER): Payer: Medicare Other | Admitting: Cardiology

## 2015-10-28 VITALS — BP 121/71 | HR 87 | Ht 70.0 in | Wt 140.0 lb

## 2015-10-28 DIAGNOSIS — N183 Chronic kidney disease, stage 3 unspecified: Secondary | ICD-10-CM

## 2015-10-28 DIAGNOSIS — E785 Hyperlipidemia, unspecified: Secondary | ICD-10-CM

## 2015-10-28 DIAGNOSIS — I359 Nonrheumatic aortic valve disorder, unspecified: Secondary | ICD-10-CM | POA: Diagnosis not present

## 2015-10-28 DIAGNOSIS — Z01810 Encounter for preprocedural cardiovascular examination: Secondary | ICD-10-CM | POA: Diagnosis not present

## 2015-10-28 MED ORDER — FUROSEMIDE 20 MG PO TABS: ORAL_TABLET | ORAL | 3 refills | Status: DC

## 2015-10-28 NOTE — Patient Instructions (Signed)
Medication Instructions:   NO CHANGE= REFILL SENT TO THE PHARMACY  Labwork:  Your physician recommends that you return for lab work WHEN CONVENIENT IN THE [place] OFFICE= DO NOT EAT PRIOR TO LAB WORK  Testing/Procedures:  Your physician has requested that you have an echocardiogram. Echocardiography is a painless test that uses sound waves to create images of your heart. It provides your doctor with information about the size and shape of your heart and how well your heart's chambers and valves are working. This procedure takes approximately one hour. There are no restrictions for this procedure.    Follow-Up:  Your physician wants you to follow-up in: 6 MONTHS WITH DR Jens SomRENSHAW You will receive a reminder letter in the mail two months in advance. If you don't receive a letter, please call our office to schedule the follow-up appointment.   If you need a refill on your cardiac medications before your next appointment, please call your pharmacy.

## 2015-10-29 LAB — HEPATIC FUNCTION PANEL
ALT: 11 U/L (ref 9–46)
AST: 19 U/L (ref 10–35)
Albumin: 3.3 g/dL — ABNORMAL LOW (ref 3.6–5.1)
Alkaline Phosphatase: 97 U/L (ref 40–115)
BILIRUBIN DIRECT: 0.2 mg/dL (ref <=0.2)
BILIRUBIN TOTAL: 0.6 mg/dL (ref 0.2–1.2)
Indirect Bilirubin: 0.4 mg/dL (ref 0.2–1.2)
Total Protein: 5.3 g/dL — ABNORMAL LOW (ref 6.1–8.1)

## 2015-10-29 LAB — LIPID PANEL
CHOL/HDL RATIO: 2.6 ratio (ref <=5.0)
CHOLESTEROL: 127 mg/dL (ref 125–200)
HDL: 48 mg/dL (ref >=40)
LDL Cholesterol: 50 mg/dL (ref <130)
Triglycerides: 146 mg/dL (ref <150)
VLDL: 29 mg/dL (ref <30)

## 2015-10-29 LAB — BASIC METABOLIC PANEL
BUN: 26 mg/dL — ABNORMAL HIGH (ref 7–25)
CHLORIDE: 104 mmol/L (ref 98–110)
CO2: 25 mmol/L (ref 20–31)
Calcium: 9.2 mg/dL (ref 8.6–10.3)
Creat: 1.57 mg/dL — ABNORMAL HIGH (ref 0.70–1.11)
GLUCOSE: 96 mg/dL (ref 65–99)
POTASSIUM: 4.4 mmol/L (ref 3.5–5.3)
SODIUM: 142 mmol/L (ref 135–146)

## 2015-10-30 ENCOUNTER — Encounter: Payer: Self-pay | Admitting: *Deleted

## 2015-11-03 ENCOUNTER — Other Ambulatory Visit: Payer: Self-pay

## 2015-11-03 DIAGNOSIS — Z961 Presence of intraocular lens: Secondary | ICD-10-CM | POA: Diagnosis not present

## 2015-11-03 DIAGNOSIS — H40003 Preglaucoma, unspecified, bilateral: Secondary | ICD-10-CM | POA: Diagnosis not present

## 2015-11-03 DIAGNOSIS — H5111 Convergence insufficiency: Secondary | ICD-10-CM | POA: Diagnosis not present

## 2015-11-03 DIAGNOSIS — H353131 Nonexudative age-related macular degeneration, bilateral, early dry stage: Secondary | ICD-10-CM | POA: Diagnosis not present

## 2015-11-03 MED ORDER — TRAMADOL HCL 50 MG PO TABS: 50.0000 mg | ORAL_TABLET | Freq: Three times a day (TID) | ORAL | 0 refills | Status: DC | PRN

## 2015-11-11 ENCOUNTER — Ambulatory Visit (HOSPITAL_BASED_OUTPATIENT_CLINIC_OR_DEPARTMENT_OTHER)
Admission: RE | Admit: 2015-11-11 | Discharge: 2015-11-11 | Disposition: A | Discharge status: Home / Self Care | Payer: Medicare Other | Source: Ambulatory Visit | Attending: Cardiology | Admitting: Cardiology

## 2015-11-11 DIAGNOSIS — I080 Rheumatic disorders of both mitral and aortic valves: Secondary | ICD-10-CM | POA: Diagnosis not present

## 2015-11-11 DIAGNOSIS — I272 Pulmonary hypertension, unspecified: Secondary | ICD-10-CM | POA: Insufficient documentation

## 2015-11-11 DIAGNOSIS — Z87891 Personal history of nicotine dependence: Secondary | ICD-10-CM | POA: Insufficient documentation

## 2015-11-11 DIAGNOSIS — I359 Nonrheumatic aortic valve disorder, unspecified: Secondary | ICD-10-CM

## 2015-11-11 NOTE — Progress Notes (Signed)
  Echocardiogram 2D Echocardiogram has been performed.  Nolon RodBrown, Tony 11/11/2015, 1:49 PM

## 2015-11-17 ENCOUNTER — Telehealth: Payer: Self-pay

## 2015-11-17 NOTE — Telephone Encounter (Signed)
Roberto Day called for a refill request for Oxycodone 7.5-325 mg one tablet every 6 hors as needed # 120. Are you ok with refilling this medication. Refill note due until 11/20/2015. Please advise.

## 2015-11-18 ENCOUNTER — Other Ambulatory Visit: Payer: Self-pay | Admitting: Physician Assistant

## 2015-11-18 DIAGNOSIS — M170 Bilateral primary osteoarthritis of knee: Secondary | ICD-10-CM

## 2015-11-18 DIAGNOSIS — M5134 Other intervertebral disc degeneration, thoracic region: Secondary | ICD-10-CM

## 2015-11-18 MED ORDER — OXYCODONE-ACETAMINOPHEN 7.5-325 MG PO TABS: 1.0000 | ORAL_TABLET | Freq: Four times a day (QID) | ORAL | 0 refills | Status: DC | PRN

## 2015-11-18 NOTE — Progress Notes (Signed)
This is Dr. Ivan AnchorsHommel patients. I have not seen him yet. We need to have discussion about pain medication. Pt needs to come into office.

## 2015-11-18 NOTE — Telephone Encounter (Signed)
Ok for this month. Pt needs to come in to discuss pain medication and protocols.

## 2015-11-19 NOTE — Telephone Encounter (Signed)
[name] notified of rx.

## 2015-11-25 ENCOUNTER — Ambulatory Visit (INDEPENDENT_AMBULATORY_CARE_PROVIDER_SITE_OTHER): Payer: Medicare Other | Admitting: Physician Assistant

## 2015-11-25 ENCOUNTER — Encounter: Payer: Self-pay | Admitting: Physician Assistant

## 2015-11-25 VITALS — BP 115/61 | HR 68 | Ht 70.0 in | Wt 144.0 lb

## 2015-11-25 DIAGNOSIS — N183 Chronic kidney disease, stage 3 unspecified: Secondary | ICD-10-CM

## 2015-11-25 DIAGNOSIS — M549 Dorsalgia, unspecified: Secondary | ICD-10-CM

## 2015-11-25 DIAGNOSIS — G8929 Other chronic pain: Secondary | ICD-10-CM

## 2015-11-25 DIAGNOSIS — M170 Bilateral primary osteoarthritis of knee: Secondary | ICD-10-CM

## 2015-11-25 MED ORDER — RANITIDINE HCL 150 MG PO TABS: 150.0000 mg | ORAL_TABLET | Freq: Two times a day (BID) | ORAL | 11 refills | Status: DC

## 2015-11-28 ENCOUNTER — Encounter: Payer: Self-pay | Admitting: Physician Assistant

## 2015-11-28 DIAGNOSIS — G8929 Other chronic pain: Secondary | ICD-10-CM | POA: Insufficient documentation

## 2015-11-28 NOTE — Progress Notes (Addendum)
Subjective:    Patient ID: Roberto SchlatterRobert J Day, male    DOB: 03/25/1921, 82 y.o.   MRN: [id number]  HPI Pt is a 82 yo male who presents to the clinic to establish care after PCP left clinic.   .. Active Ambulatory Problems    Diagnosis Date Noted  . Hematuria 10/07/2011  . Inguinal hernia, bilateral 10/07/2011  . Unintentional weight loss 10/07/2011  . BPH (benign prostatic hyperplasia) 10/07/2011  . Bladder stone 10/07/2011  . Elevated PSA (6.1 05/2011), (6.5 07/2011) (7.6 10/13) (7.5 5/14) 10/11/2011  . Other and unspecified hyperlipidemia 10/12/2011  . UTI (lower urinary tract infection) 10/17/2011  . Hyperlipidemia 10/17/2011  . Osteopenia (Femur T= -1.9 11/2011) 10/17/2011  . Chronic back pain 10/17/2011  . Rheumatoid arthritis (per outside records) 10/17/2011  . Glaucoma 10/17/2011  . CKD (chronic kidney disease) stage 3, GFR 30-59 ml/min 10/17/2011  . 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 Diarrhea 02/10/2012  . Polypharmacy 02/21/2012  . Diverticulosis of colon without hemorrhage 03/05/2012  . Osteoarthritis of both knees 06/20/2012  . Osteoarthritis of right glenohumeral joint 06/27/2012  . Aortic regurgitation 11/15/2012  . Mitral valve stenosis 11/15/2012  . Pulmonary fibrosis (HCC) 12/10/2012  . Tremor 05/07/2013  . Solitary pulmonary nodule 05/08/2013  . Glaucoma suspect 07/01/2013  . Atherosclerosis of aorta (HCC) 11/08/2013  . Essential tremor 02/18/2014  . Vision loss 02/18/2014  . Hyperglycemia 05/05/2014  . Allergic rhinitis 07/24/2014  . Abnormality of gait 12/03/2014  . Excess ear wax 02/24/2015  . Epigastric pain 08/06/2015  . Swelling of right lower extremity 10/12/2015  . H/O inguinal hernia repair 10/12/2015   Resolved Ambulatory Problems    Diagnosis Date Noted  . Pulmonary nodule, left 10/17/2011  . Pulmonary nodule 11/21/2012  . CAP (community acquired pneumonia) 02/12/2015   Past Medical History:   Diagnosis Date  . Aortic stenosis   . Arthritis   . Bladder infection   . Bladder stone 10/07/2011  . BPH (benign prostatic hyperplasia) 10/07/2011  . GERD (gastroesophageal reflux disease)   . Hematuria 10/07/2011  . Hyperlipidemia   . Hypertension   . Migraines   . Nitrate poisoning 1945  . Pneumonia   . Stomach ulcer 1945  . Tremors of nervous system   . Unintentional weight loss 10/07/2011   .Marland Kitchen. Family History  Problem Relation Age of Onset  . Heart attack Mother   . Stroke Mother   . Cancer Mother     cervial and ovarian  . Diabetes Father   . Heart Problems Brother     ruptured heart   .Marland Kitchen. Social History   Social History  . Marital status: Married    Spouse name: N/A  . Number of children: 3  . Years of education: HS   Occupational History  . Retired    Social History Main Topics  . Smoking status: Former Smoker    Packs/day: 0.50    Years: 5.00    Types: Cigarettes  . Smokeless tobacco: Never Used     Comment: 12/05/2011 "haven't smoked since age [age]"  . Alcohol use No  . Drug use: No  . Sexual activity: No   Other Topics Concern  . Not on file   Social History Narrative   Lives at home with his son.   Right-handed.   1 cup caffeine per daily.   Pt does not need any refills today but comes in to discuss pain management. He has been taking  oxycodone 4 times a day for bilateral knee OA and low back pain.    Review of Systems  All other systems reviewed and are negative.      Objective:   Physical Exam  Constitutional: He is oriented to person, place, and time. He appears well-developed and well-nourished.  HENT:  Head: Normocephalic and atraumatic.  Cardiovascular: Normal rate and regular rhythm.   Murmur heard. 3/6 systolic ejection murmur.   Pulmonary/Chest: Effort normal and breath sounds normal.  Neurological: He is alert and oriented to person, place, and time.  Psychiatric: He has a normal mood and affect. His behavior is normal.           Assessment & Plan:  Marland Kitchen.Marland Kitchen.Diagnoses and all orders for this visit:  Primary osteoarthritis of both knees -     Pain Mgmt, Profile 6 Conf w/o mM, U  CKD (chronic kidney disease) stage 3, GFR 30-59 ml/min  Chronic back pain, unspecified back location, unspecified back pain laterality -     Pain Mgmt, Profile 6 Conf w/o mM, U  Other orders -     ranitidine (ZANTAC) 150 MG tablet; Take 1 tablet (150 mg total) by mouth 2 (two) times daily.   Discussed with daughter and patient new pain management policy along with risk of taking opiate so many times a day with current age.  Opoid risk tool 0.  Pain contract signed today.  UDS done in office.  Discussed will set a goal of tapering from 120 to 90 over next 3 months. Will decrease by 10 tablets each month.  Encouraged use of OTc NSAIDs or biofreeze.  Follow up in 3 months.   Spent 30 minutes with patient discuss pain contract and goal of tapering down on opiates.

## 2015-11-30 LAB — PAIN MGMT, PROFILE 6 CONF W/O MM, U
6 Acetylmorphine: NEGATIVE ng/mL (ref <10)
AMPHETAMINES: NEGATIVE ng/mL (ref <500)
Alcohol Metabolites: NEGATIVE ng/mL (ref <500)
BARBITURATES: NEGATIVE ng/mL (ref <300)
Benzodiazepines: NEGATIVE ng/mL (ref <100)
COCAINE METABOLITE: NEGATIVE ng/mL (ref <150)
Codeine: NEGATIVE ng/mL (ref <50)
Creatinine: 36.9 mg/dL (ref >=20.0)
Hydrocodone: NEGATIVE ng/mL (ref <50)
Hydromorphone: NEGATIVE ng/mL (ref <50)
MARIJUANA METABOLITE: NEGATIVE ng/mL (ref <20)
MORPHINE: NEGATIVE ng/mL (ref <50)
Methadone Metabolite: NEGATIVE ng/mL (ref <100)
NORHYDROCODONE: NEGATIVE ng/mL (ref <50)
Noroxycodone: 2821 ng/mL — ABNORMAL HIGH (ref <50)
OXIDANT: NEGATIVE ug/mL (ref <200)
OXYCODONE: 623 ng/mL — AB (ref <50)
OXYCODONE: POSITIVE ng/mL — AB (ref <100)
OXYMORPHONE: 77 ng/mL — AB (ref <50)
Opiates: NEGATIVE ng/mL (ref <100)
Phencyclidine: NEGATIVE ng/mL (ref <25)
Please note:: 0
pH: 7.02 (ref 4.5–9.0)

## 2015-12-17 ENCOUNTER — Other Ambulatory Visit: Payer: Self-pay

## 2015-12-17 MED ORDER — OXYCODONE-ACETAMINOPHEN 7.5-325 MG PO TABS: 1.0000 | ORAL_TABLET | Freq: Four times a day (QID) | ORAL | 0 refills | Status: DC | PRN

## 2015-12-23 ENCOUNTER — Other Ambulatory Visit: Payer: Self-pay | Admitting: Physician Assistant

## 2016-01-14 ENCOUNTER — Other Ambulatory Visit: Payer: Self-pay | Admitting: Physician Assistant

## 2016-01-14 NOTE — Telephone Encounter (Signed)
Since he is on Hydrocodone, is this okay to fill?

## 2016-01-15 ENCOUNTER — Telehealth: Payer: Self-pay | Admitting: *Deleted

## 2016-01-15 NOTE — Telephone Encounter (Signed)
Pt should not be on both oxycodone and tramadol. Confirm which one he is taking.

## 2016-01-15 NOTE — Telephone Encounter (Signed)
[name] is requesting a refill of tramadol for this patient. Is he supposed to be on tramadol and oxycodone? Please rout to amber as I will not be here this afternoon.

## 2016-01-22 ENCOUNTER — Other Ambulatory Visit: Payer: Self-pay

## 2016-01-22 MED ORDER — OXYCODONE-ACETAMINOPHEN 7.5-325 MG PO TABS: 1.0000 | ORAL_TABLET | Freq: Four times a day (QID) | ORAL | 0 refills | Status: DC | PRN

## 2016-02-03 IMAGING — CR DG LUMBAR SPINE COMPLETE 4+V
3 series · 3 of 3 positions shown · non-contrast
Comparison: None.

CLINICAL DATA: Low back pain after a fall this morning.

EXAM:
LUMBAR SPINE - COMPLETE 4+ VIEW

[l-spine ap]
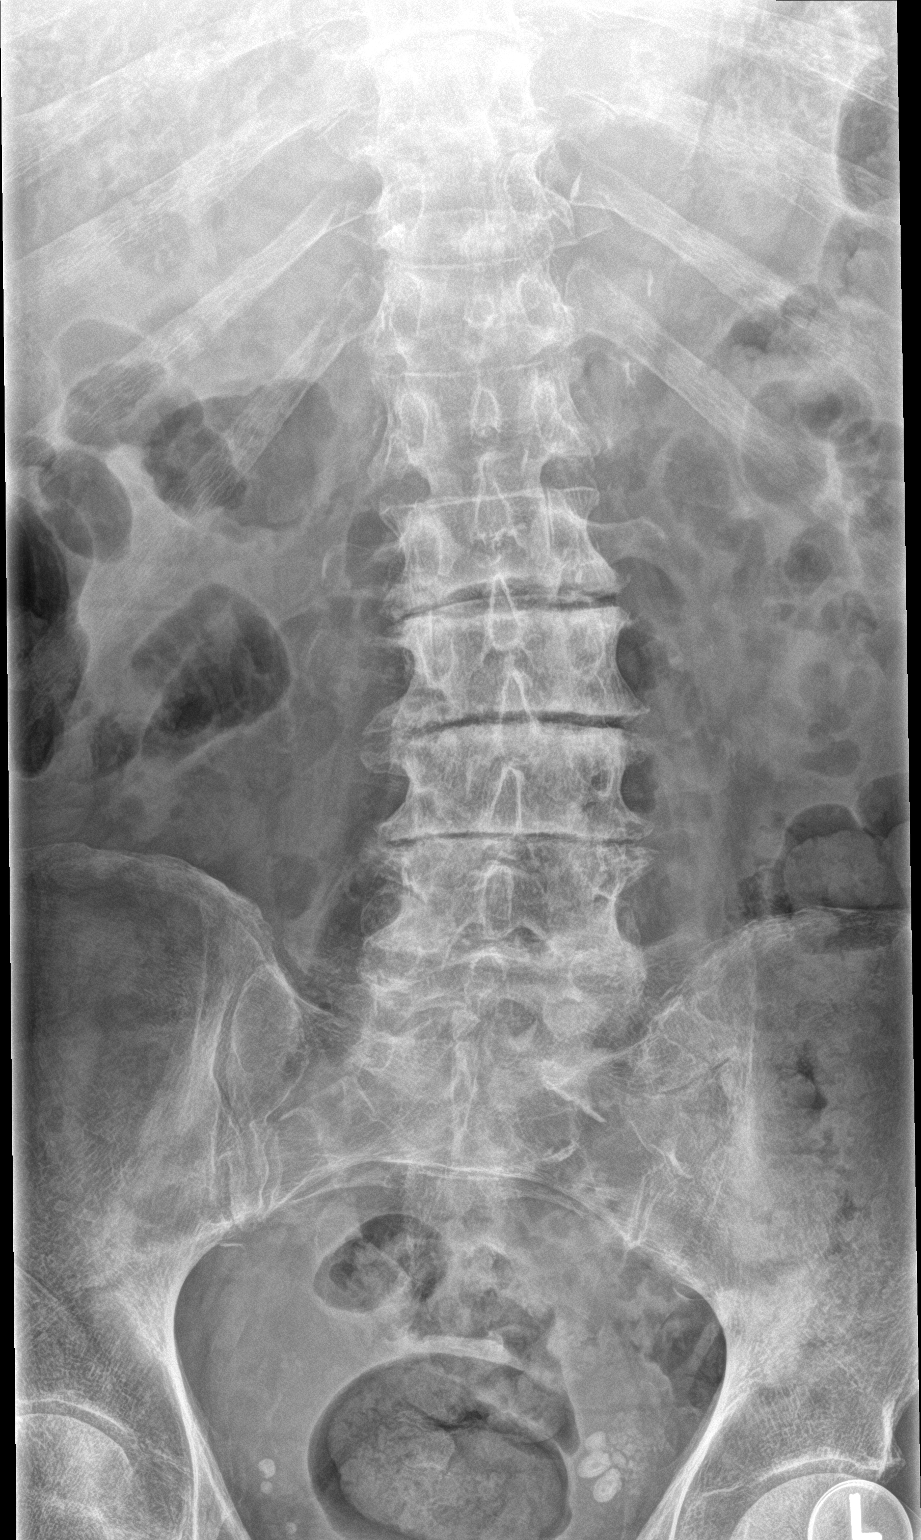

[l-spine lat]
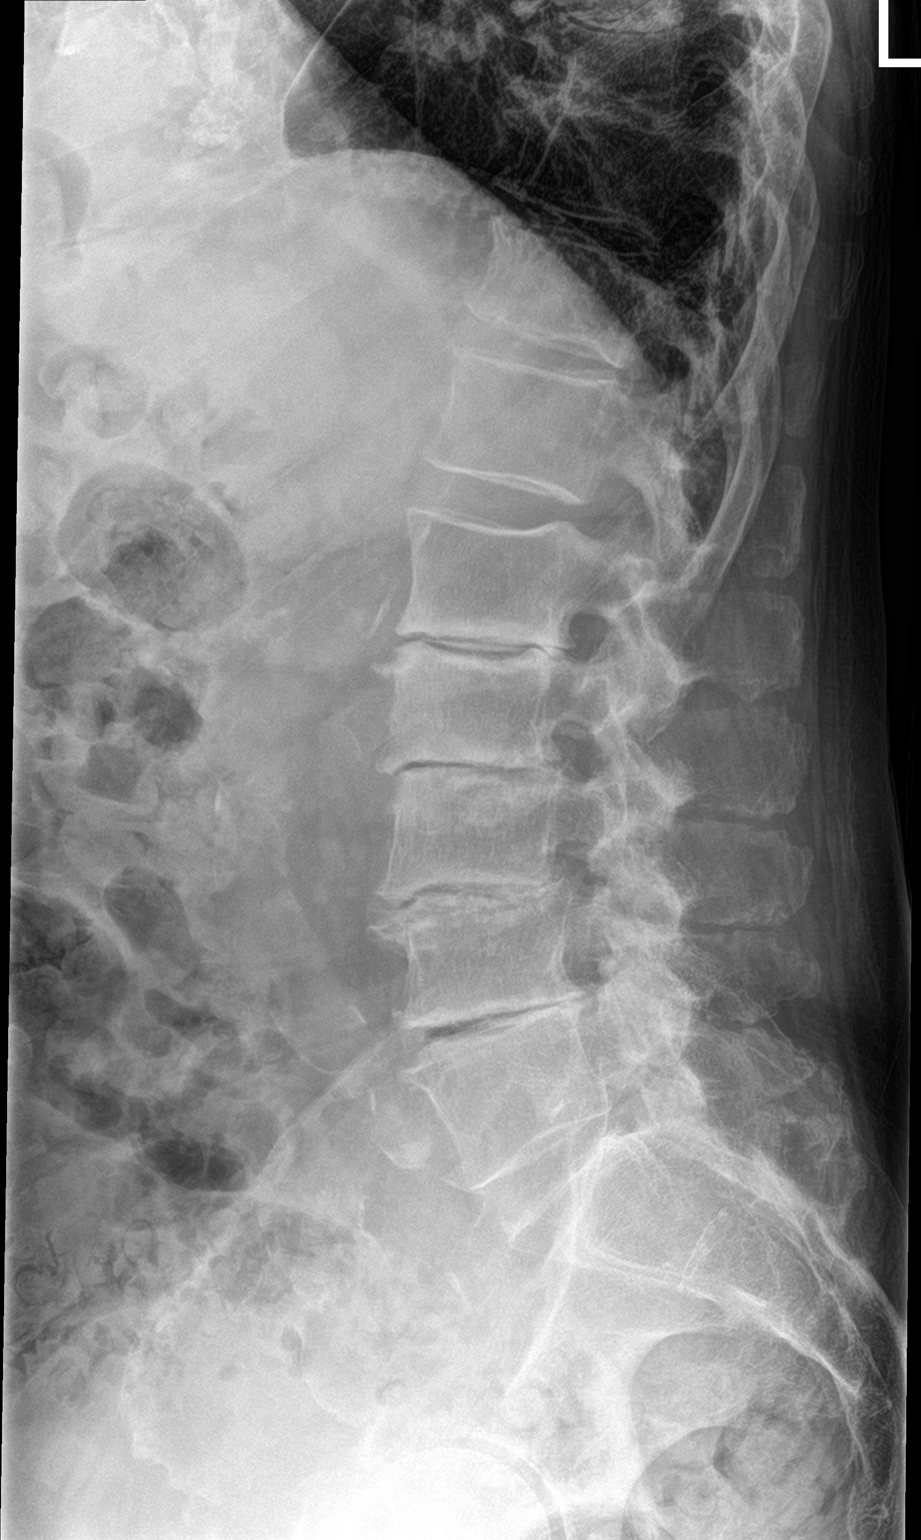

[l-spine spot]
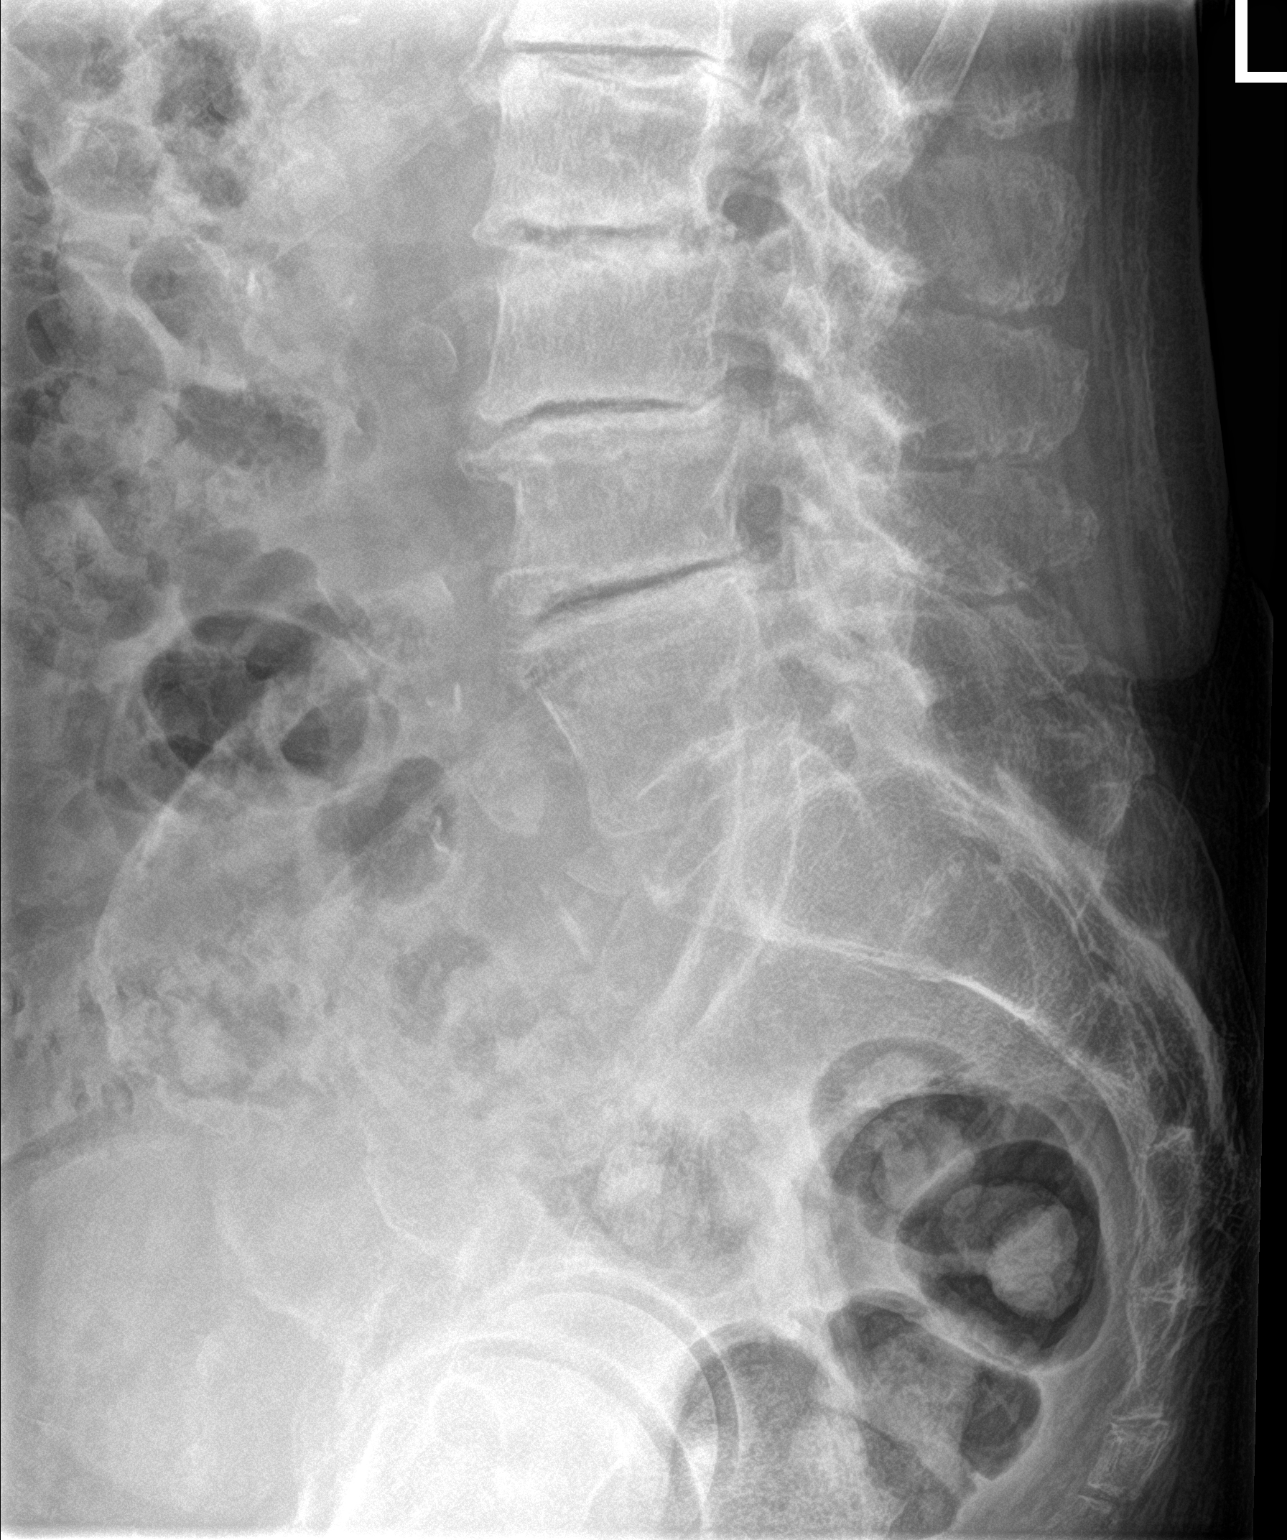

[3 of 3 positions shown; findings below may reference images not displayed]

FINDINGS: There is no acute fracture. There is severe degenerative disc
disease at L1-2 through L4-5 with slight anterolisthesis of L2 on L3
and of L3 on L4. Multilevel degenerative facet arthritis.
Calcification in the abdominal aorta and iliac arteries.
IMPRESSION: No acute abnormalities of the lumbar spine.

## 2016-02-19 ENCOUNTER — Other Ambulatory Visit: Payer: Self-pay

## 2016-02-19 MED ORDER — TAMSULOSIN HCL 0.4 MG PO CAPS: ORAL_CAPSULE | ORAL | 0 refills | Status: DC

## 2016-02-23 ENCOUNTER — Encounter: Payer: Self-pay | Admitting: Physician Assistant

## 2016-02-23 ENCOUNTER — Ambulatory Visit (INDEPENDENT_AMBULATORY_CARE_PROVIDER_SITE_OTHER): Payer: Medicare Other | Admitting: Physician Assistant

## 2016-02-23 VITALS — BP 131/72 | HR 86

## 2016-02-23 DIAGNOSIS — L89221 Pressure ulcer of left hip, stage 1: Secondary | ICD-10-CM

## 2016-02-23 DIAGNOSIS — M19011 Primary osteoarthritis, right shoulder: Secondary | ICD-10-CM

## 2016-02-23 DIAGNOSIS — R601 Generalized edema: Secondary | ICD-10-CM

## 2016-02-23 DIAGNOSIS — M170 Bilateral primary osteoarthritis of knee: Secondary | ICD-10-CM | POA: Diagnosis not present

## 2016-02-23 DIAGNOSIS — N183 Chronic kidney disease, stage 3 unspecified: Secondary | ICD-10-CM

## 2016-02-23 DIAGNOSIS — M5134 Other intervertebral disc degeneration, thoracic region: Secondary | ICD-10-CM | POA: Diagnosis not present

## 2016-02-23 DIAGNOSIS — G894 Chronic pain syndrome: Secondary | ICD-10-CM | POA: Diagnosis not present

## 2016-02-23 LAB — BASIC METABOLIC PANEL WITH GFR
BUN: 21 mg/dL (ref 7–25)
CALCIUM: 9.8 mg/dL (ref 8.6–10.3)
CO2: 30 mmol/L (ref 20–31)
Chloride: 100 mmol/L (ref 98–110)
Creat: 1.87 mg/dL — ABNORMAL HIGH (ref 0.70–1.11)
GFR, EST NON AFRICAN AMERICAN: 31 mL/min — AB (ref >=60)
GFR, Est African American: 35 mL/min — ABNORMAL LOW (ref >=60)
GLUCOSE: 129 mg/dL — AB (ref 65–99)
POTASSIUM: 4 mmol/L (ref 3.5–5.3)
Sodium: 139 mmol/L (ref 135–146)

## 2016-02-23 MED ORDER — HYDROXYZINE HCL 10 MG PO TABS: 10.0000 mg | ORAL_TABLET | Freq: Three times a day (TID) | ORAL | 2 refills | Status: DC | PRN

## 2016-02-23 MED ORDER — SPIRONOLACTONE 25 MG PO TABS: 25.0000 mg | ORAL_TABLET | Freq: Every day | ORAL | 2 refills | Status: DC

## 2016-02-23 MED ORDER — OXYCODONE-ACETAMINOPHEN 7.5-325 MG PO TABS: 1.0000 | ORAL_TABLET | Freq: Four times a day (QID) | ORAL | 0 refills | Status: DC | PRN

## 2016-02-23 MED ORDER — FUROSEMIDE 20 MG PO TABS: ORAL_TABLET | ORAL | 3 refills | Status: DC

## 2016-02-23 NOTE — Patient Instructions (Signed)
Pressure Injury A pressure injury, sometimes called a bedsore, is an injury to the skin and underlying tissue caused by pressure. Pressure on blood vessels causes decreased blood flow to the skin, which can eventually cause the skin tissue to die and break down into a wound. Pressure injuries usually occur:  Over bony parts of the body such as the tailbone, shoulders, elbows, hips, and heels.  Under medical devices such as respiratory equipment, stockings, tubes, and splints. Pressure injuries start as reddened areas on the skin and can lead to pain, muscle damage, and infection. Pressure injuries can vary in severity. What are the causes? Pressure injuries are caused by a lack of blood supply to an area of skin. They can occur from intense pressure over a short period of time or from less intense pressure over a long period of time. What increases the risk? This condition is more likely to develop in people who:  Are in the hospital or an extended care facility.  Are bedridden or in a wheelchair.  Have an injury or disease that keeps them from:  Moving normally.  Feeling pain or pressure.  Have a condition that:  Makes them sleepy or less alert.  Causes poor blood flow.  Need to wear a medical device.  Have poor control of their bladder or bowel functions (incontinence).  Have poor nutrition (malnutrition).  Are of certain ethnicities. People of African American and Latino or Hispanic descent are at higher risk compared to other ethnic groups. If you are at risk for pressure ulcers, your health care provider may recommend certain types of bedding to help prevent them. These may include foam or gel mattresses covered with one of the following:  A sheepskin blanket.  A pad that is filled with gel, air, water, or foam. What are the signs or symptoms? The main symptom is a blister or change in skin color that opens into a wound. Other symptoms include:  Red or dark areas of  skin that do not turn white or pale when pressed with a finger.  Pain, warmth, or change of skin texture. How is this diagnosed? This condition is diagnosed with a medical history and physical exam. You may also have tests, including:  Blood tests to check for infection or signs of poor nutrition.  Imaging studies to check for damage to the deep tissues under your skin.  Blood flow studies. Your pressure injury will be staged to determine its severity. Staging is an assessment of:  The depth of the pressure injury.  Which tissues are exposed because of the pressure injury.  The causes of the pressure injury. How is this treated? The main focus of treatment is to help your injury heal. This may be done by:  Relieving or redistributing pressure on your skin. This includes:  Frequently changing your position.  Eliminating or minimizing positions that caused the wound or that can make the wound worse.  Using specific bed mattresses and chair cushions.  Refitting, resizing, or replacing any medical devices, or padding the skin under them.  Using creams or powders to prevent rubbing (friction) on the skin.  Keeping your skin clean and dry. This may include using a skin cleanser or skin protectant as told by your health care provider. This may be a lotion, ointment, or spray.  Cleaning your injury and removing any dead tissue from the wound (debridement).  Placing a bandage (dressing) over your injury.  Preventing or treating infection. This may include antibiotic, antimicrobial, or   antiseptic medicines. Treatment may also include medicine for pain. Sometimes surgery is needed to close the wound with a flap of healthy skin or a piece of skin from another area of your body (graft). You may need surgery if other treatments are not working or if your injury is very deep. Follow these instructions at home: Wound care   Follow instructions from your health care provider about:  How  to take care of your wound.  When and how you should change your dressing.  When you should remove your dressing. If your dressing is dry and stuck when you try to remove it, moisten or wet the dressing with saline or water so that it can be removed without harming your skin or wound tissue.  Check your wound every day for signs of infection. Have a caregiver do this for you if you are not able. Watch for:  More redness, swelling, or pain.  More fluid, blood, or pus.  A bad smell. Skin Care   Keep your skin clean and dry. Gently pat your skin dry.  Do not rub or massage your skin.  Use a skin protectant only as told by your health care provider.  Check your skin every day for any changes in color or any new blisters or sores (ulcers). Have a caregiver do this for you if you are not able. Medicines   Take over-the-counter and prescription medicines only as told by your health care provider.  If you were prescribed an antibiotic medicine, take it or apply it as told by your health care provider. Do not stop taking or using the antibiotic even if your condition improves. Reducing and Redistributing Pressure   Do not lie or sit in one position for a long time. Move or change position every two hours or as told by your health care provider.  Use pillows or cushions to reduce pressure. Ask your health care provider to recommend cushions or pads for you.  Use medical devices that do not rub your skin. Tell your health care provider if one of your medical devices is causing a pressure injury to develop. General instructions    Eat a healthy diet that includes lots of protein. Ask your health care provider for diet advice.  Drink enough fluid to keep your urine clear or pale yellow.  Be as active as you can every day. Ask your health care provider to suggest safe exercises or activities.  Do not abuse drugs or alcohol.  Keep all follow-up visits as told by your health care  provider. This is important.  Do not smoke. Contact a health care provider if:   You have chills or fever.  Your pain medicine is not helping.  You have any changes in skin color.  You have new blisters or sores.  You develop warmth, redness, or swelling near a pressure injury.  You have a bad odor or pus coming from your pressure injury.  You lose control of your bowels or bladder.  You develop new symptoms.  Your wound does not improve after 1-2 weeks of treatment.  You develop a new medical condition, such as diabetes, peripheral vascular disease, or conditions that affect your defense (immune) system. This information is not intended to replace advice given to you by your health care provider. Make sure you discuss any questions you have with your health care provider. Document Released: 12/20/2004 Document Revised: 05/25/2015 Document Reviewed: 04/30/2014 Elsevier Interactive Patient Education  2017 [employer]  

## 2016-02-23 NOTE — Progress Notes (Signed)
Subjective:    Patient ID: Roberto SchlatterRobert J Day, male    DOB: 04/17/1921, 83 y.o.   MRN: [id number]  HPI  Pt is a 83 yo male who presents to the clinic for 3 month follow up. He is accompanied by his daughter in law. He needs his pain medications refilled for chronic pain. He is doing ok on the current dose. Over past 3 weeks he has noticed a sore on his left hip. Seems to keep getting redder and painful. He has not put anything on it. He is limited in his ambulation and spends most of his Day in his chair and bed. He is more prone to lay on left side. He has noticed more overall swelling as well in his hands and feet. He is taking lasix. Denies any SOB.    Review of Systems  All other systems reviewed and are negative.      Objective:   Physical Exam  Constitutional: He is oriented to person, place, and time. He appears well-developed and well-nourished.  HENT:  Head: Normocephalic and atraumatic.  Cardiovascular: Normal rate, regular rhythm and normal heart sounds.   Pulmonary/Chest: Effort normal and breath sounds normal.  Neurological: He is alert and oriented to person, place, and time.  Skin:  Left hip 1cm by 1cm stage 1 ulcer with 3cm ring of erythema. No discharge. Not warm to touch.   Psychiatric: He has a normal mood and affect. His behavior is normal.          Assessment & Plan:  Marland Kitchen.Marland Kitchen.Diagnoses and all orders for this visit:  Decubitus ulcer of left hip, stage 1 -     oxyCODONE-acetaminophen (PERCOCET) 7.5-325 MG tablet; Take 1 tablet by mouth every 6 (six) hours as needed for moderate pain or severe pain.  CKD (chronic kidney disease) stage 3, GFR 30-59 ml/min -     furosemide (LASIX) 20 MG tablet; TAKE THREE TABLETS BY MOUTH ONCE DAILY. MAY  TAKE  1  EXTRA  TABLET  BY  MOUTH  DAILY  AS  NEEDED  FOR  SWELLING -     BASIC METABOLIC PANEL WITH GFR  Generalized edema -     spironolactone (ALDACTONE) 25 MG tablet; Take 1 tablet (25 mg total) by mouth daily.  Osteoarthritis  of right glenohumeral joint -     oxyCODONE-acetaminophen (PERCOCET) 7.5-325 MG tablet; Take 1 tablet by mouth every 6 (six) hours as needed for moderate pain or severe pain.  Primary osteoarthritis of both knees -     oxyCODONE-acetaminophen (PERCOCET) 7.5-325 MG tablet; Take 1 tablet by mouth every 6 (six) hours as needed for moderate pain or severe pain.  Degenerative disc disease, thoracic T9/T10 (Consider MRI r/o Diskitis) -     oxyCODONE-acetaminophen (PERCOCET) 7.5-325 MG tablet; Take 1 tablet by mouth every 6 (six) hours as needed for moderate pain or severe pain.  Chronic pain syndrome -     oxyCODONE-acetaminophen (PERCOCET) 7.5-325 MG tablet; Take 1 tablet by mouth every 6 (six) hours as needed for moderate pain or severe pain.  Other orders -     hydrOXYzine (ATARAX/VISTARIL) 10 MG tablet; Take 1 tablet (10 mg total) by mouth 3 (three) times daily as needed.   [place] controlled substance databases reviewed. 3 months ago UDS with no concerns.  Refilled for 3 months.  Not taking NSAIDs but has been using voltaren gel.   For swelling added spirolactone.   duoderm placed on ulcer. Discussed treatment plan to to change dressing every  7 days. Follow up in 2 weeks. I do think we caught it early. Make sure to switch positiions in bed and chairs to avoid constant pressure. Consider foam bed/chair.

## 2016-02-25 ENCOUNTER — Encounter: Payer: Self-pay | Admitting: Physician Assistant

## 2016-02-25 DIAGNOSIS — L89221 Pressure ulcer of left hip, stage 1: Secondary | ICD-10-CM | POA: Insufficient documentation

## 2016-02-25 DIAGNOSIS — R601 Generalized edema: Secondary | ICD-10-CM | POA: Insufficient documentation

## 2016-02-26 ENCOUNTER — Ambulatory Visit: Payer: Medicare Other | Admitting: Physician Assistant

## 2016-03-04 ENCOUNTER — Other Ambulatory Visit: Payer: Self-pay

## 2016-03-04 DIAGNOSIS — R7989 Other specified abnormal findings of blood chemistry: Secondary | ICD-10-CM

## 2016-03-08 LAB — BASIC METABOLIC PANEL
BUN: 37 mg/dL — AB (ref 7–25)
CO2: 29 mmol/L (ref 20–31)
CREATININE: 1.67 mg/dL — AB (ref 0.70–1.11)
Calcium: 9.1 mg/dL (ref 8.6–10.3)
Chloride: 102 mmol/L (ref 98–110)
Glucose, Bld: 147 mg/dL — ABNORMAL HIGH (ref 65–99)
POTASSIUM: 4.4 mmol/L (ref 3.5–5.3)
Sodium: 138 mmol/L (ref 135–146)

## 2016-03-08 NOTE — Progress Notes (Signed)
Serum creatine is improving.

## 2016-03-22 ENCOUNTER — Ambulatory Visit (INDEPENDENT_AMBULATORY_CARE_PROVIDER_SITE_OTHER): Payer: Medicare Other | Admitting: Physician Assistant

## 2016-03-22 ENCOUNTER — Encounter: Payer: Self-pay | Admitting: Physician Assistant

## 2016-03-22 VITALS — BP 129/79 | HR 93 | Ht 70.0 in | Wt 132.0 lb

## 2016-03-22 DIAGNOSIS — M5134 Other intervertebral disc degeneration, thoracic region: Secondary | ICD-10-CM

## 2016-03-22 DIAGNOSIS — L89221 Pressure ulcer of left hip, stage 1: Secondary | ICD-10-CM | POA: Diagnosis not present

## 2016-03-22 DIAGNOSIS — M19011 Primary osteoarthritis, right shoulder: Secondary | ICD-10-CM

## 2016-03-22 DIAGNOSIS — G894 Chronic pain syndrome: Secondary | ICD-10-CM

## 2016-03-22 DIAGNOSIS — M170 Bilateral primary osteoarthritis of knee: Secondary | ICD-10-CM

## 2016-03-22 MED ORDER — OXYCODONE-ACETAMINOPHEN 7.5-325 MG PO TABS: 1.0000 | ORAL_TABLET | Freq: Four times a day (QID) | ORAL | 0 refills | Status: DC | PRN

## 2016-03-22 NOTE — Patient Instructions (Signed)
Pressure Injury A pressure injury, sometimes called a bedsore, is an injury to the skin and underlying tissue caused by pressure. Pressure on blood vessels causes decreased blood flow to the skin, which can eventually cause the skin tissue to die and break down into a wound. Pressure injuries usually occur:  Over bony parts of the body such as the tailbone, shoulders, elbows, hips, and heels.  Under medical devices such as respiratory equipment, stockings, tubes, and splints. Pressure injuries start as reddened areas on the skin and can lead to pain, muscle damage, and infection. Pressure injuries can vary in severity. What are the causes? Pressure injuries are caused by a lack of blood supply to an area of skin. They can occur from intense pressure over a short period of time or from less intense pressure over a long period of time. What increases the risk? This condition is more likely to develop in people who:  Are in the hospital or an extended care facility.  Are bedridden or in a wheelchair.  Have an injury or disease that keeps them from:  Moving normally.  Feeling pain or pressure.  Have a condition that:  Makes them sleepy or less alert.  Causes poor blood flow.  Need to wear a medical device.  Have poor control of their bladder or bowel functions (incontinence).  Have poor nutrition (malnutrition).  Are of certain ethnicities. People of African American and Latino or Hispanic descent are at higher risk compared to other ethnic groups. If you are at risk for pressure ulcers, your health care provider may recommend certain types of bedding to help prevent them. These may include foam or gel mattresses covered with one of the following:  A sheepskin blanket.  A pad that is filled with gel, air, water, or foam. What are the signs or symptoms? The main symptom is a blister or change in skin color that opens into a wound. Other symptoms include:  Red or dark areas of  skin that do not turn white or pale when pressed with a finger.  Pain, warmth, or change of skin texture. How is this diagnosed? This condition is diagnosed with a medical history and physical exam. You may also have tests, including:  Blood tests to check for infection or signs of poor nutrition.  Imaging studies to check for damage to the deep tissues under your skin.  Blood flow studies. Your pressure injury will be staged to determine its severity. Staging is an assessment of:  The depth of the pressure injury.  Which tissues are exposed because of the pressure injury.  The causes of the pressure injury. How is this treated? The main focus of treatment is to help your injury heal. This may be done by:  Relieving or redistributing pressure on your skin. This includes:  Frequently changing your position.  Eliminating or minimizing positions that caused the wound or that can make the wound worse.  Using specific bed mattresses and chair cushions.  Refitting, resizing, or replacing any medical devices, or padding the skin under them.  Using creams or powders to prevent rubbing (friction) on the skin.  Keeping your skin clean and dry. This may include using a skin cleanser or skin protectant as told by your health care provider. This may be a lotion, ointment, or spray.  Cleaning your injury and removing any dead tissue from the wound (debridement).  Placing a bandage (dressing) over your injury.  Preventing or treating infection. This may include antibiotic, antimicrobial, or   antiseptic medicines. Treatment may also include medicine for pain. Sometimes surgery is needed to close the wound with a flap of healthy skin or a piece of skin from another area of your body (graft). You may need surgery if other treatments are not working or if your injury is very deep. Follow these instructions at home: Wound care   Follow instructions from your health care provider about:  How  to take care of your wound.  When and how you should change your dressing.  When you should remove your dressing. If your dressing is dry and stuck when you try to remove it, moisten or wet the dressing with saline or water so that it can be removed without harming your skin or wound tissue.  Check your wound every day for signs of infection. Have a caregiver do this for you if you are not able. Watch for:  More redness, swelling, or pain.  More fluid, blood, or pus.  A bad smell. Skin Care   Keep your skin clean and dry. Gently pat your skin dry.  Do not rub or massage your skin.  Use a skin protectant only as told by your health care provider.  Check your skin every day for any changes in color or any new blisters or sores (ulcers). Have a caregiver do this for you if you are not able. Medicines   Take over-the-counter and prescription medicines only as told by your health care provider.  If you were prescribed an antibiotic medicine, take it or apply it as told by your health care provider. Do not stop taking or using the antibiotic even if your condition improves. Reducing and Redistributing Pressure   Do not lie or sit in one position for a long time. Move or change position every two hours or as told by your health care provider.  Use pillows or cushions to reduce pressure. Ask your health care provider to recommend cushions or pads for you.  Use medical devices that do not rub your skin. Tell your health care provider if one of your medical devices is causing a pressure injury to develop. General instructions    Eat a healthy diet that includes lots of protein. Ask your health care provider for diet advice.  Drink enough fluid to keep your urine clear or pale yellow.  Be as active as you can every day. Ask your health care provider to suggest safe exercises or activities.  Do not abuse drugs or alcohol.  Keep all follow-up visits as told by your health care  provider. This is important.  Do not smoke. Contact a health care provider if:   You have chills or fever.  Your pain medicine is not helping.  You have any changes in skin color.  You have new blisters or sores.  You develop warmth, redness, or swelling near a pressure injury.  You have a bad odor or pus coming from your pressure injury.  You lose control of your bowels or bladder.  You develop new symptoms.  Your wound does not improve after 1-2 weeks of treatment.  You develop a new medical condition, such as diabetes, peripheral vascular disease, or conditions that affect your defense (immune) system. This information is not intended to replace advice given to you by your health care provider. Make sure you discuss any questions you have with your health care provider. Document Released: 12/20/2004 Document Revised: 05/25/2015 Document Reviewed: 04/30/2014 Elsevier Interactive Patient Education  2017 [employer]  

## 2016-03-23 DIAGNOSIS — G894 Chronic pain syndrome: Secondary | ICD-10-CM | POA: Insufficient documentation

## 2016-03-23 NOTE — Progress Notes (Signed)
   Subjective:    Patient ID: Roberto Day, male    DOB: 07/02/1921, 83 y.o.   MRN: [id number]  HPI Pt is a 83 yo male who presents to the clinic to follow up on decubitus ulcer. Pt used duoderm until ran out. Seems to be helping a lot but there continues to be a small ulceration and some tenderness.   Needs refill of percocet for chronic pain.    Review of Systems  All other systems reviewed and are negative.      Objective:   Physical Exam  Constitutional: He is oriented to person, place, and time. He appears well-developed and well-nourished.  HENT:  Head: Normocephalic and atraumatic.  Cardiovascular: Normal rate, regular rhythm and normal heart sounds.   Pulmonary/Chest: Effort normal and breath sounds normal.  Neurological: He is alert and oriented to person, place, and time.  Skin:  Left hip one abrasion and one 2mm ulceration that almost completely healed.   Psychiatric: He has a normal mood and affect. His behavior is normal.          Assessment & Plan:  Marland Kitchen.Marland Kitchen.Diagnoses and all orders for this visit:  Decubitus ulcer of left hip, stage 1 -     oxyCODONE-acetaminophen (PERCOCET) 7.5-325 MG tablet; Take 1 tablet by mouth every 6 (six) hours as needed for moderate pain or severe pain.  Osteoarthritis of right glenohumeral joint -     oxyCODONE-acetaminophen (PERCOCET) 7.5-325 MG tablet; Take 1 tablet by mouth every 6 (six) hours as needed for moderate pain or severe pain.  Primary osteoarthritis of both knees -     oxyCODONE-acetaminophen (PERCOCET) 7.5-325 MG tablet; Take 1 tablet by mouth every 6 (six) hours as needed for moderate pain or severe pain.  Degenerative disc disease, thoracic T9/T10 (Consider MRI r/o Diskitis) -     oxyCODONE-acetaminophen (PERCOCET) 7.5-325 MG tablet; Take 1 tablet by mouth every 6 (six) hours as needed for moderate pain or severe pain.  Chronic pain syndrome -     oxyCODONE-acetaminophen (PERCOCET) 7.5-325 MG tablet; Take 1 tablet by  mouth every 6 (six) hours as needed for moderate pain or severe pain.   Follow up in 2 months.   Given duoderm to place over remaining wound for one week then take off and replace until extra given is gone. Follow up as needed. Discussed prevention. Make sure getting good nutrition.

## 2016-04-13 DIAGNOSIS — M25562 Pain in left knee: Secondary | ICD-10-CM | POA: Diagnosis not present

## 2016-04-13 DIAGNOSIS — M25561 Pain in right knee: Secondary | ICD-10-CM | POA: Diagnosis not present

## 2016-04-13 DIAGNOSIS — M170 Bilateral primary osteoarthritis of knee: Secondary | ICD-10-CM | POA: Diagnosis not present

## 2016-04-13 DIAGNOSIS — M19012 Primary osteoarthritis, left shoulder: Secondary | ICD-10-CM | POA: Diagnosis not present

## 2016-04-13 DIAGNOSIS — M19011 Primary osteoarthritis, right shoulder: Secondary | ICD-10-CM | POA: Diagnosis not present

## 2016-04-20 ENCOUNTER — Other Ambulatory Visit: Payer: Self-pay

## 2016-04-20 ENCOUNTER — Telehealth: Payer: Self-pay

## 2016-04-20 DIAGNOSIS — M5134 Other intervertebral disc degeneration, thoracic region: Secondary | ICD-10-CM

## 2016-04-20 DIAGNOSIS — M19011 Primary osteoarthritis, right shoulder: Secondary | ICD-10-CM

## 2016-04-20 DIAGNOSIS — G894 Chronic pain syndrome: Secondary | ICD-10-CM

## 2016-04-20 DIAGNOSIS — L89221 Pressure ulcer of left hip, stage 1: Secondary | ICD-10-CM

## 2016-04-20 DIAGNOSIS — M170 Bilateral primary osteoarthritis of knee: Secondary | ICD-10-CM

## 2016-04-20 MED ORDER — OXYCODONE-ACETAMINOPHEN 7.5-325 MG PO TABS: 1.0000 | ORAL_TABLET | Freq: Four times a day (QID) | ORAL | 0 refills | Status: DC | PRN

## 2016-04-20 NOTE — Telephone Encounter (Signed)
Percocet script in your box to sign. Pt caregiver called for refill

## 2016-04-21 NOTE — Telephone Encounter (Signed)
Signed.

## 2016-05-03 DIAGNOSIS — H5111 Convergence insufficiency: Secondary | ICD-10-CM | POA: Diagnosis not present

## 2016-05-03 DIAGNOSIS — Z961 Presence of intraocular lens: Secondary | ICD-10-CM | POA: Diagnosis not present

## 2016-05-03 DIAGNOSIS — H40003 Preglaucoma, unspecified, bilateral: Secondary | ICD-10-CM | POA: Diagnosis not present

## 2016-05-03 DIAGNOSIS — H353131 Nonexudative age-related macular degeneration, bilateral, early dry stage: Secondary | ICD-10-CM | POA: Diagnosis not present

## 2016-05-18 ENCOUNTER — Other Ambulatory Visit: Payer: Self-pay | Admitting: Physician Assistant

## 2016-05-20 ENCOUNTER — Other Ambulatory Visit: Payer: Self-pay

## 2016-05-20 DIAGNOSIS — G894 Chronic pain syndrome: Secondary | ICD-10-CM

## 2016-05-20 DIAGNOSIS — M170 Bilateral primary osteoarthritis of knee: Secondary | ICD-10-CM

## 2016-05-20 DIAGNOSIS — L89221 Pressure ulcer of left hip, stage 1: Secondary | ICD-10-CM

## 2016-05-20 DIAGNOSIS — M19011 Primary osteoarthritis, right shoulder: Secondary | ICD-10-CM

## 2016-05-20 DIAGNOSIS — M5134 Other intervertebral disc degeneration, thoracic region: Secondary | ICD-10-CM

## 2016-05-20 MED ORDER — OXYCODONE-ACETAMINOPHEN 7.5-325 MG PO TABS: 1.0000 | ORAL_TABLET | Freq: Four times a day (QID) | ORAL | 0 refills | Status: DC | PRN

## 2016-05-24 ENCOUNTER — Ambulatory Visit (INDEPENDENT_AMBULATORY_CARE_PROVIDER_SITE_OTHER): Payer: Medicare Other | Admitting: Physician Assistant

## 2016-05-24 ENCOUNTER — Encounter: Payer: Self-pay | Admitting: Physician Assistant

## 2016-05-24 VITALS — BP 132/78 | HR 83

## 2016-05-24 DIAGNOSIS — G894 Chronic pain syndrome: Secondary | ICD-10-CM | POA: Diagnosis not present

## 2016-05-24 DIAGNOSIS — L89221 Pressure ulcer of left hip, stage 1: Secondary | ICD-10-CM | POA: Diagnosis not present

## 2016-05-24 DIAGNOSIS — I050 Rheumatic mitral stenosis: Secondary | ICD-10-CM | POA: Diagnosis not present

## 2016-05-24 DIAGNOSIS — N183 Chronic kidney disease, stage 3 unspecified: Secondary | ICD-10-CM

## 2016-05-24 DIAGNOSIS — I351 Nonrheumatic aortic (valve) insufficiency: Secondary | ICD-10-CM | POA: Diagnosis not present

## 2016-05-25 ENCOUNTER — Other Ambulatory Visit: Payer: Self-pay | Admitting: Physician Assistant

## 2016-05-25 DIAGNOSIS — M25562 Pain in left knee: Secondary | ICD-10-CM | POA: Diagnosis not present

## 2016-05-25 DIAGNOSIS — M25561 Pain in right knee: Secondary | ICD-10-CM | POA: Diagnosis not present

## 2016-05-25 DIAGNOSIS — M19012 Primary osteoarthritis, left shoulder: Secondary | ICD-10-CM | POA: Diagnosis not present

## 2016-05-25 DIAGNOSIS — M19011 Primary osteoarthritis, right shoulder: Secondary | ICD-10-CM | POA: Diagnosis not present

## 2016-05-25 DIAGNOSIS — R601 Generalized edema: Secondary | ICD-10-CM

## 2016-05-26 NOTE — Progress Notes (Signed)
Subjective:    Patient ID: Roberto Day, male    DOB: 08/07/1921, 83 y.o.   MRN: [id number]  HPI Pt is a 83 yo male with chronic pain who presents to the clinic for 3 month follow up.   He is doing well. He does have hx of decubutis ulcer of left hip. Caregiver is concerned because he has an area of redness that she would like looked at.   .. Active Ambulatory Problems    Diagnosis Date Noted  . Hematuria 10/07/2011  . Inguinal hernia, bilateral 10/07/2011  . Unintentional weight loss 10/07/2011  . BPH (benign prostatic hyperplasia) 10/07/2011  . Bladder stone 10/07/2011  . Elevated PSA (6.1 05/2011), (6.5 07/2011) (7.6 10/13) (7.5 5/14) 10/11/2011  . Other and unspecified hyperlipidemia 10/12/2011  . UTI (lower urinary tract infection) 10/17/2011  . Hyperlipidemia 10/17/2011  . Osteopenia (Femur T= -1.9 11/2011) 10/17/2011  . Chronic back pain 10/17/2011  . Rheumatoid arthritis (per outside records) 10/17/2011  . Glaucoma 10/17/2011  . CKD (chronic kidney disease) stage 3, GFR 30-59 ml/min 10/17/2011  . 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 Diarrhea 02/10/2012  . Polypharmacy 02/21/2012  . Diverticulosis of colon without hemorrhage 03/05/2012  . Osteoarthritis of both knees 06/20/2012  . Osteoarthritis of right glenohumeral joint 06/27/2012  . Aortic regurgitation 11/15/2012  . Mitral valve stenosis 11/15/2012  . Pulmonary fibrosis (HCC) 12/10/2012  . Tremor 05/07/2013  . Solitary pulmonary nodule 05/08/2013  . Glaucoma suspect 07/01/2013  . Atherosclerosis of aorta (HCC) 11/08/2013  . Essential tremor 02/18/2014  . Hyperglycemia 05/05/2014  . Allergic rhinitis 07/24/2014  . Abnormality of gait 12/03/2014  . Excess ear wax 02/24/2015  . Epigastric pain 08/06/2015  . Swelling of right lower extremity 10/12/2015  . H/O inguinal hernia repair 10/12/2015  . Encounter for chronic pain management 11/28/2015  . Decubitus ulcer  of left hip, stage 1 02/25/2016  . Generalized edema 02/25/2016  . Chronic pain syndrome 03/23/2016   Resolved Ambulatory Problems    Diagnosis Date Noted  . Pulmonary nodule, left 10/17/2011  . Pulmonary nodule 11/21/2012  . Vision loss 02/18/2014  . CAP (community acquired pneumonia) 02/12/2015   Past Medical History:  Diagnosis Date  . Aortic stenosis   . Arthritis   . Bladder infection   . Bladder stone 10/07/2011  . BPH (benign prostatic hyperplasia) 10/07/2011  . GERD (gastroesophageal reflux disease)   . Hematuria 10/07/2011  . Hyperlipidemia   . Hypertension   . Migraines   . Nitrate poisoning 1945  . Pneumonia   . Stomach ulcer 1945  . Tremors of nervous system   . Unintentional weight loss 10/07/2011      Review of Systems  All other systems reviewed and are negative.      Objective:   Physical Exam  Constitutional: He is oriented to person, place, and time. He appears well-nourished.  In a wheelchair.  HENT:  Head: Normocephalic and atraumatic.  Cardiovascular: Normal rate and regular rhythm.   Murmur heard. Loud holosystolic ejection murmur 6/6 with thrill.   Neurological: He is alert and oriented to person, place, and time.  Skin:  Left lateral buttocks area of erythema and skin appears thin. No open would or ulcer.   Psychiatric: He has a normal mood and affect. His behavior is normal.          Assessment & Plan:  Marland Kitchen.Marland Kitchen.Diagnoses and all orders for this visit:  Chronic pain syndrome  CKD (  chronic kidney disease) stage 3, GFR 30-59 ml/min  Decubitus ulcer of left hip, stage 1  Mitral valve stenosis, unspecified etiology  Aortic valve insufficiency, etiology of cardiac valve disease unspecified   No medications needed today.  He does have the start of some skin redness and thining where ulcer could start to form. Given duoderm to use as needed. Discussed frequent position changes to avoid this. Follow up as needed.  Continue same dose of  pain medication. Pt does not need at this time.

## 2016-05-29 ENCOUNTER — Other Ambulatory Visit: Payer: Self-pay | Admitting: Physician Assistant

## 2016-06-07 DIAGNOSIS — N401 Enlarged prostate with lower urinary tract symptoms: Secondary | ICD-10-CM | POA: Diagnosis not present

## 2016-06-07 DIAGNOSIS — N138 Other obstructive and reflux uropathy: Secondary | ICD-10-CM | POA: Diagnosis not present

## 2016-06-10 ENCOUNTER — Telehealth: Payer: Self-pay | Admitting: *Deleted

## 2016-06-10 ENCOUNTER — Other Ambulatory Visit: Payer: Self-pay | Admitting: *Deleted

## 2016-06-10 MED ORDER — LINACLOTIDE 145 MCG PO CAPS: 145.0000 ug | ORAL_CAPSULE | Freq: Every day | ORAL | 2 refills | Status: DC

## 2016-06-10 NOTE — Telephone Encounter (Signed)
Pt's daughter asked for rx for Linzess.  Verbal consent given by Tandy GawJade Breeback, PA-C.  Linzess 145mg  #30 2 rf sent to pharm.  Daughter notified.

## 2016-06-23 ENCOUNTER — Other Ambulatory Visit: Payer: Self-pay | Admitting: *Deleted

## 2016-06-23 DIAGNOSIS — M170 Bilateral primary osteoarthritis of knee: Secondary | ICD-10-CM

## 2016-06-23 DIAGNOSIS — M19011 Primary osteoarthritis, right shoulder: Secondary | ICD-10-CM

## 2016-06-23 DIAGNOSIS — G894 Chronic pain syndrome: Secondary | ICD-10-CM

## 2016-06-23 DIAGNOSIS — L89221 Pressure ulcer of left hip, stage 1: Secondary | ICD-10-CM

## 2016-06-23 DIAGNOSIS — M5134 Other intervertebral disc degeneration, thoracic region: Secondary | ICD-10-CM

## 2016-06-23 MED ORDER — OXYCODONE-ACETAMINOPHEN 7.5-325 MG PO TABS: 1.0000 | ORAL_TABLET | Freq: Four times a day (QID) | ORAL | 0 refills | Status: DC | PRN

## 2016-07-04 DIAGNOSIS — M25562 Pain in left knee: Secondary | ICD-10-CM | POA: Diagnosis not present

## 2016-07-04 DIAGNOSIS — M19012 Primary osteoarthritis, left shoulder: Secondary | ICD-10-CM | POA: Diagnosis not present

## 2016-07-04 DIAGNOSIS — M25561 Pain in right knee: Secondary | ICD-10-CM | POA: Diagnosis not present

## 2016-07-04 DIAGNOSIS — M19011 Primary osteoarthritis, right shoulder: Secondary | ICD-10-CM | POA: Diagnosis not present

## 2016-07-21 ENCOUNTER — Other Ambulatory Visit: Payer: Self-pay

## 2016-07-21 DIAGNOSIS — G894 Chronic pain syndrome: Secondary | ICD-10-CM

## 2016-07-21 DIAGNOSIS — M170 Bilateral primary osteoarthritis of knee: Secondary | ICD-10-CM

## 2016-07-21 DIAGNOSIS — M5134 Other intervertebral disc degeneration, thoracic region: Secondary | ICD-10-CM

## 2016-07-21 DIAGNOSIS — L89221 Pressure ulcer of left hip, stage 1: Secondary | ICD-10-CM

## 2016-07-21 DIAGNOSIS — M19011 Primary osteoarthritis, right shoulder: Secondary | ICD-10-CM

## 2016-07-21 MED ORDER — OXYCODONE-ACETAMINOPHEN 7.5-325 MG PO TABS: 1.0000 | ORAL_TABLET | Freq: Four times a day (QID) | ORAL | 0 refills | Status: DC | PRN

## 2016-07-21 NOTE — Telephone Encounter (Signed)
Daughter in law called to have pt pain meds refilled. Please advise

## 2016-08-01 ENCOUNTER — Other Ambulatory Visit: Payer: Self-pay | Admitting: Physician Assistant

## 2016-08-18 ENCOUNTER — Other Ambulatory Visit: Payer: Self-pay | Admitting: Physician Assistant

## 2016-08-21 ENCOUNTER — Other Ambulatory Visit: Payer: Self-pay | Admitting: Physician Assistant

## 2016-08-21 DIAGNOSIS — R601 Generalized edema: Secondary | ICD-10-CM

## 2016-08-23 ENCOUNTER — Encounter: Payer: Self-pay | Admitting: Physician Assistant

## 2016-08-23 ENCOUNTER — Ambulatory Visit (INDEPENDENT_AMBULATORY_CARE_PROVIDER_SITE_OTHER): Payer: Medicare Other | Admitting: Physician Assistant

## 2016-08-23 VITALS — BP 132/84 | HR 91 | Temp 98.0°F | Wt 126.0 lb

## 2016-08-23 DIAGNOSIS — G894 Chronic pain syndrome: Secondary | ICD-10-CM

## 2016-08-23 DIAGNOSIS — M19011 Primary osteoarthritis, right shoulder: Secondary | ICD-10-CM | POA: Diagnosis not present

## 2016-08-23 DIAGNOSIS — K4020 Bilateral inguinal hernia, without obstruction or gangrene, not specified as recurrent: Secondary | ICD-10-CM | POA: Diagnosis not present

## 2016-08-23 DIAGNOSIS — R251 Tremor, unspecified: Secondary | ICD-10-CM

## 2016-08-23 DIAGNOSIS — M5134 Other intervertebral disc degeneration, thoracic region: Secondary | ICD-10-CM | POA: Diagnosis not present

## 2016-08-23 DIAGNOSIS — M170 Bilateral primary osteoarthritis of knee: Secondary | ICD-10-CM | POA: Diagnosis not present

## 2016-08-23 DIAGNOSIS — Z23 Encounter for immunization: Secondary | ICD-10-CM | POA: Diagnosis not present

## 2016-08-23 MED ORDER — OXYCODONE-ACETAMINOPHEN 7.5-325 MG PO TABS: 1.0000 | ORAL_TABLET | Freq: Four times a day (QID) | ORAL | 0 refills | Status: DC | PRN

## 2016-08-23 NOTE — Progress Notes (Signed)
Subjective:    Patient ID: Roberto Day, male    DOB: 16-Jun-1921, 83 y.o.   MRN: [id number]  HPI Pt is a 83 yo male who presents to the clinic with caregiver for chronic pain management follow up.   He feels controlled at current opoid dose. He has no concerns or complaints.   He does feel like vistaril has helped tremor but still remains first thing in the morning.   He still has a lot of discomfort with inguinal hernia especially after he eats. Worse on right than left. Currently not doing anything to make better.   .. Active Ambulatory Problems    Diagnosis Date Noted  . Hematuria 10/07/2011  . Inguinal hernia, bilateral 10/07/2011  . Unintentional weight loss 10/07/2011  . BPH (benign prostatic hyperplasia) 10/07/2011  . Bladder stone 10/07/2011  . Elevated PSA (6.1 05/2011), (6.5 07/2011) (7.6 10/13) (7.5 5/14) 10/11/2011  . Other and unspecified hyperlipidemia 10/12/2011  . UTI (lower urinary tract infection) 10/17/2011  . Hyperlipidemia 10/17/2011  . Osteopenia (Femur T= -1.9 11/2011) 10/17/2011  . Chronic back pain 10/17/2011  . Rheumatoid arthritis (per outside records) 10/17/2011  . Glaucoma 10/17/2011  . CKD (chronic kidney disease) stage 3, GFR 30-59 ml/min 10/17/2011  . 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 Diarrhea 02/10/2012  . Polypharmacy 02/21/2012  . Diverticulosis of colon without hemorrhage 03/05/2012  . Osteoarthritis of both knees 06/20/2012  . Osteoarthritis of right glenohumeral joint 06/27/2012  . Aortic regurgitation 11/15/2012  . Mitral valve stenosis 11/15/2012  . Pulmonary fibrosis (HCC) 12/10/2012  . Tremor 05/07/2013  . Solitary pulmonary nodule 05/08/2013  . Glaucoma suspect 07/01/2013  . Atherosclerosis of aorta (HCC) 11/08/2013  . Essential tremor 02/18/2014  . Hyperglycemia 05/05/2014  . Allergic rhinitis 07/24/2014  . Abnormality of gait 12/03/2014  . Excess ear wax 02/24/2015  .  Epigastric pain 08/06/2015  . Swelling of right lower extremity 10/12/2015  . H/O inguinal hernia repair 10/12/2015  . Encounter for chronic pain management 11/28/2015  . Decubitus ulcer of left hip, stage 1 02/25/2016  . Generalized edema 02/25/2016  . Chronic pain syndrome 03/23/2016   Resolved Ambulatory Problems    Diagnosis Date Noted  . Pulmonary nodule, left 10/17/2011  . Pulmonary nodule 11/21/2012  . Vision loss 02/18/2014  . CAP (community acquired pneumonia) 02/12/2015   Past Medical History:  Diagnosis Date  . Aortic stenosis   . Arthritis   . Bladder infection   . Bladder stone 10/07/2011  . BPH (benign prostatic hyperplasia) 10/07/2011  . GERD (gastroesophageal reflux disease)   . Hematuria 10/07/2011  . Hyperlipidemia   . Hypertension   . Migraines   . Nitrate poisoning 1945  . Pneumonia   . Stomach ulcer 1945  . Tremors of nervous system   . Unintentional weight loss 10/07/2011      Review of Systems See HPI.     Objective:   Physical Exam  Constitutional: He is oriented to person, place, and time. He appears well-developed and well-nourished.  HENT:  Head: Normocephalic and atraumatic.  Cardiovascular: Normal rate, regular rhythm and normal heart sounds.   6/6 holosystolic murmur  Pulmonary/Chest: Effort normal and breath sounds normal.  Neurological: He is alert and oriented to person, place, and time.  Psychiatric: He has a normal mood and affect. His behavior is normal.          Assessment & Plan:  Marland KitchenMarland KitchenReiden was seen today for pain management.  Diagnoses and all orders for this visit:  Chronic pain syndrome -     oxyCODONE-acetaminophen (PERCOCET) 7.5-325 MG tablet; Take 1 tablet by mouth every 6 (six) hours as needed for moderate pain or severe pain. -     Pain Mgmt, Profile 6 Conf w/o mM, U  Decubitus ulcer of left hip, stage 1 -     oxyCODONE-acetaminophen (PERCOCET) 7.5-325 MG tablet; Take 1 tablet by mouth every 6 (six) hours as  needed for moderate pain or severe pain.  Osteoarthritis of right glenohumeral joint -     oxyCODONE-acetaminophen (PERCOCET) 7.5-325 MG tablet; Take 1 tablet by mouth every 6 (six) hours as needed for moderate pain or severe pain.  Primary osteoarthritis of both knees -     oxyCODONE-acetaminophen (PERCOCET) 7.5-325 MG tablet; Take 1 tablet by mouth every 6 (six) hours as needed for moderate pain or severe pain.  Degenerative disc disease, thoracic T9/T10 (Consider MRI r/o Diskitis) -     oxyCODONE-acetaminophen (PERCOCET) 7.5-325 MG tablet; Take 1 tablet by mouth every 6 (six) hours as needed for moderate pain or severe pain.  Needs flu shot -     Flu vaccine HIGH DOSE PF (Fluzone High dose)  Need for 23-polyvalent pneumococcal polysaccharide vaccine -     Pneumococcal polysaccharide vaccine 23-valent greater than or equal to 2yo subcutaneous/IM  Tremor  Bilateral inguinal hernia without obstruction or gangrene, recurrence not specified  Other orders -     Cancel: oxyCODONE-acetaminophen (PERCOCET) 7.5-325 MG tablet; Take 1 tablet by mouth every 6 (six) hours as needed for moderate pain or severe pain.     .. Depression screen [facility] 2/9 08/23/2016 08/23/2016 03/22/2016 05/07/2013  Decreased Interest 0 0 3 1  Down, Depressed, Hopeless 0 0 0 1  PHQ - 2 Score 0 0 3 2  Altered sleeping - - 2 1  Tired, decreased energy - - 2 1  Change in appetite - - 1 0  Feeling bad or failure about yourself  - - 0 0  Trouble concentrating - - 0 0  Moving slowly or fidgety/restless - - - 0  Suicidal thoughts - - 0 0  PHQ-9 Score - - 8 4   pain contract up to date.  UDS done today.  Low risk opoid assessment.  [place] controlled substance database reviewed with no concerns.   Pt cannot tolerate codiene, he had a GI bleed with celebrex.  After viewing his chart will consider talking to him about gabapentin for pain.   Increase vistaril to 20mg  in am and 10mg  other times for tremor and anxiety. Follow  up in 3 months.   Consider ace wrap to allow patient to feel more support especially after eating. Pt is not a surgical candidate.

## 2016-08-26 LAB — PAIN MGMT, PROFILE 6 CONF W/O MM, U
6 ACETYLMORPHINE: NEGATIVE ng/mL (ref <10)
ALCOHOL METABOLITES: NEGATIVE ng/mL (ref <500)
AMPHETAMINES: NEGATIVE ng/mL (ref <500)
Barbiturates: NEGATIVE ng/mL (ref <300)
Benzodiazepines: NEGATIVE ng/mL (ref <100)
CODEINE: NEGATIVE ng/mL (ref <50)
CREATININE: 47.5 mg/dL (ref >=20.0)
Cocaine Metabolite: NEGATIVE ng/mL (ref <150)
HYDROMORPHONE: NEGATIVE ng/mL (ref <50)
Hydrocodone: NEGATIVE ng/mL (ref <50)
MARIJUANA METABOLITE: NEGATIVE ng/mL (ref <20)
Methadone Metabolite: NEGATIVE ng/mL (ref <100)
Morphine: NEGATIVE ng/mL (ref <50)
NOROXYCODONE: 4138 ng/mL — AB (ref <50)
Norhydrocodone: NEGATIVE ng/mL (ref <50)
OPIATES: NEGATIVE ng/mL (ref <100)
OXIDANT: NEGATIVE ug/mL (ref <200)
OXYCODONE: 982 ng/mL — AB (ref <50)
Oxycodone: POSITIVE ng/mL — AB (ref <100)
Oxymorphone: 130 ng/mL — ABNORMAL HIGH (ref <50)
PH: 6.88 (ref 4.5–9.0)
PHENCYCLIDINE: NEGATIVE ng/mL (ref <25)
PLEASE NOTE: 0

## 2016-08-29 ENCOUNTER — Telehealth: Payer: Self-pay | Admitting: Physician Assistant

## 2016-08-29 NOTE — Telephone Encounter (Signed)
Pt's daughter-n-law called adv the Hydroxyzine which was increased is working and he will need another refill called in due to the increase because he will not have enough to last this month. Thanks Please adv

## 2016-08-30 MED ORDER — HYDROXYZINE HCL 10 MG PO TABS: 10.0000 mg | ORAL_TABLET | Freq: Three times a day (TID) | ORAL | 5 refills | Status: DC | PRN

## 2016-08-30 NOTE — Telephone Encounter (Signed)
Please advise 

## 2016-08-30 NOTE — Telephone Encounter (Signed)
Pt's daughter in law advised.

## 2016-08-30 NOTE — Telephone Encounter (Signed)
Rx sent 

## 2016-08-30 NOTE — Telephone Encounter (Signed)
Yes ok to send refill for up to three times a day 90 tablets with 5 refills.

## 2016-09-02 DIAGNOSIS — M1711 Unilateral primary osteoarthritis, right knee: Secondary | ICD-10-CM | POA: Diagnosis not present

## 2016-09-02 DIAGNOSIS — M170 Bilateral primary osteoarthritis of knee: Secondary | ICD-10-CM | POA: Diagnosis not present

## 2016-09-02 DIAGNOSIS — M19011 Primary osteoarthritis, right shoulder: Secondary | ICD-10-CM | POA: Diagnosis not present

## 2016-09-02 DIAGNOSIS — M19012 Primary osteoarthritis, left shoulder: Secondary | ICD-10-CM | POA: Diagnosis not present

## 2016-09-08 ENCOUNTER — Ambulatory Visit (INDEPENDENT_AMBULATORY_CARE_PROVIDER_SITE_OTHER): Payer: Medicare Other | Admitting: Family Medicine

## 2016-09-08 ENCOUNTER — Encounter: Payer: Self-pay | Admitting: Family Medicine

## 2016-09-08 VITALS — BP 113/74 | HR 77 | Wt 126.0 lb

## 2016-09-08 DIAGNOSIS — K4020 Bilateral inguinal hernia, without obstruction or gangrene, not specified as recurrent: Secondary | ICD-10-CM | POA: Diagnosis not present

## 2016-09-08 DIAGNOSIS — R7989 Other specified abnormal findings of blood chemistry: Secondary | ICD-10-CM | POA: Diagnosis not present

## 2016-09-08 DIAGNOSIS — N183 Chronic kidney disease, stage 3 unspecified: Secondary | ICD-10-CM

## 2016-09-08 DIAGNOSIS — M7989 Other specified soft tissue disorders: Secondary | ICD-10-CM

## 2016-09-08 DIAGNOSIS — R601 Generalized edema: Secondary | ICD-10-CM | POA: Diagnosis not present

## 2016-09-08 NOTE — Progress Notes (Signed)
   Subjective:    I'm seeing this patient as a consultation for: Tandy GawJade Breeback, PA  CC: Foot swelling  HPI: Foot swelling: Patient reports that both feet were swollen for about 1 week. Patient has previously had similar episodes of swelling which usually resolves with compression stockings. This time, the swelling on the left foot resolved with the stockings but the right did not. Daughter reports that the stocking on the right side was rolled down when she last checked and she believes this may have contributed to the lack of improvement. She also noted some change in skin color which has since improved. Patient has not missed any doses of spironolactone or lasix. Patient denies any shortness of breath, recent illness, fever, chills, or weight loss.   Hernia: Patient also reports hernia which has been present for many years. He states that he has seen several surgeons who have evaluated it and decided not to offer surgical repair. The hernia is on the right side. He has not noticed any new intense pain or peritoneal signs.    Past medical history, Surgical history, Family history not pertinant except as noted below, Social history, Allergies, and medications have been entered into the medical record, reviewed, and no changes needed.   Review of Systems: No headache, visual changes, nausea, vomiting, diarrhea, constipation, dizziness, abdominal pain, skin rash, fevers, chills, night sweats, weight loss, swollen lymph nodes, body aches, joint swelling, muscle aches, chest pain, shortness of breath, mood changes, visual or auditory hallucinations.   Objective:    Vitals:   09/08/16 1314  BP: 113/74  Pulse: 77   General: Well Developed, well nourished, and in no acute distress.  Neuro/Psych: Alert and oriented x3, extra-ocular muscles intact, able to move all 4 extremities, sensation grossly intact. Skin: Warm and dry, no rashes noted.  Respiratory: Normal work of breathing clear to  auscultation bilaterally Cardiovascular: Regular rate and rhythm no murmurs rubs or gallops Abdomen: Nondistended. Normal active bowel sounds. Right femoral/direct inguinal hernia present tender however was reducible and became nontender. No rebound or guarding. Right lower extremity  4+ pitting edema present, no bruising on inspection, some evidence of statis dermatitis over the middle of the tibia No tenderness with palpation Range of motion is full with dorsiflexion and plantar flexion   No results found for this or any previous visit (from the past 24 hour(s)). No results found.  Impression and Recommendations:    Assessment and Plan: 83 y.o. male with foot swelling. Given patient's exam findings and daughter's report of compression stocking use, it is likely that his right foot swelling is due to fluid collection. DVT would is also on the differential, but this is unlikely given that there is no pain and the problem was originally bilateral. Patient's hernia was reduced in clinic and this could also be contributing to a decrease in leg drainage secondary to compression of the femoral vein. Patient was instructed on how to reduce hernia if further episodes occur. Patient was also advised to use compression stockings throughout the day and to have blood work completed today. Patient should follow-up in clinic if symptoms worsen or do not improve in the next few days.   Orders Placed This Encounter  Procedures  . CBC  . COMPLETE METABOLIC PANEL WITH GFR  . B Nat Peptide   No orders of the defined types were placed in this encounter.   Discussed warning signs or symptoms. Please see discharge instructions. Patient expresses understanding.

## 2016-09-08 NOTE — Patient Instructions (Signed)
Thank you for coming in today. Continue to press in the hernia if able.   Use the compression stockings.   Get labs today.   Follow up with Tandy GawJade Breeback in 2 weeks.    Edema Edema is when you have too much fluid in your body or under your skin. Edema may make your legs, feet, and ankles swell up. Swelling is also common in looser tissues, like around your eyes. This is a common condition. It gets more common as you get older. There are many possible causes of edema. Eating too much salt (sodium) and being on your feet or sitting for a long time can cause edema in your legs, feet, and ankles. Hot weather may make edema worse. Edema is usually painless. Your skin may look swollen or shiny. Follow these instructions at home:  Keep the swollen body part raised (elevated) above the level of your heart when you are sitting or lying down.  Do not sit still or stand for a long time.  Do not wear tight clothes. Do not wear garters on your upper legs.  Exercise your legs. This can help the swelling go down.  Wear elastic bandages or support stockings as told by your doctor.  Eat a low-salt (low-sodium) diet to reduce fluid as told by your doctor.  Depending on the cause of your swelling, you may need to limit how much fluid you drink (fluid restriction).  Take over-the-counter and prescription medicines only as told by your doctor. Contact a doctor if:  Treatment is not working.  You have heart, liver, or kidney disease and have symptoms of edema.  You have sudden and unexplained weight gain. Get help right away if:  You have shortness of breath or chest pain.  You cannot breathe when you lie down.  You have pain, redness, or warmth in the swollen areas.  You have heart, liver, or kidney disease and get edema all of a sudden.  You have a fever and your symptoms get worse all of a sudden. Summary  Edema is when you have too much fluid in your body or under your skin.  Edema  may make your legs, feet, and ankles swell up. Swelling is also common in looser tissues, like around your eyes.  Raise (elevate) the swollen body part above the level of your heart when you are sitting or lying down.  Follow your doctor's instructions about diet and how much fluid you can drink (fluid restriction). This information is not intended to replace advice given to you by your health care provider. Make sure you discuss any questions you have with your health care provider. Document Released: 06/08/2007 Document Revised: 01/08/2016 Document Reviewed: 01/08/2016 Elsevier Interactive Patient Education  2017 [employer]

## 2016-09-09 LAB — CBC
HCT: 41.1 % (ref 38.5–50.0)
Hemoglobin: 13.2 g/dL (ref 13.2–17.1)
MCH: 28.8 pg (ref 27.0–33.0)
MCHC: 32.1 g/dL (ref 32.0–36.0)
MCV: 89.5 fL (ref 80.0–100.0)
MPV: 11.2 fL (ref 7.5–12.5)
PLATELETS: 217 10*3/uL (ref 140–400)
RBC: 4.59 10*6/uL (ref 4.20–5.80)
RDW: 14.8 % (ref 11.0–15.0)
WBC: 10.8 10*3/uL (ref 3.8–10.8)

## 2016-09-09 LAB — COMPLETE METABOLIC PANEL WITH GFR
AG RATIO: 1.8 (calc) (ref 1.0–2.5)
ALKALINE PHOSPHATASE (APISO): 86 U/L (ref 40–115)
ALT: 18 U/L (ref 9–46)
AST: 23 U/L (ref 10–35)
Albumin: 3.5 g/dL — ABNORMAL LOW (ref 3.6–5.1)
BILIRUBIN TOTAL: 0.5 mg/dL (ref 0.2–1.2)
BUN/Creatinine Ratio: 20 (calc) (ref 6–22)
BUN: 32 mg/dL — ABNORMAL HIGH (ref 7–25)
CHLORIDE: 100 mmol/L (ref 98–110)
CO2: 29 mmol/L (ref 20–32)
Calcium: 9.4 mg/dL (ref 8.6–10.3)
Creat: 1.57 mg/dL — ABNORMAL HIGH (ref 0.70–1.11)
GFR, EST NON AFRICAN AMERICAN: 37 mL/min/{1.73_m2} — AB (ref >=60)
GFR, Est African American: 43 mL/min/{1.73_m2} — ABNORMAL LOW (ref >=60)
GLOBULIN: 2 g/dL (ref 1.9–3.7)
Glucose, Bld: 104 mg/dL — ABNORMAL HIGH (ref 65–99)
POTASSIUM: 4 mmol/L (ref 3.5–5.3)
SODIUM: 137 mmol/L (ref 135–146)
Total Protein: 5.5 g/dL — ABNORMAL LOW (ref 6.1–8.1)

## 2016-09-09 LAB — BRAIN NATRIURETIC PEPTIDE: Brain Natriuretic Peptide: 423 pg/mL — ABNORMAL HIGH (ref <100)

## 2016-09-12 ENCOUNTER — Other Ambulatory Visit: Payer: Self-pay | Admitting: Physician Assistant

## 2016-09-14 ENCOUNTER — Ambulatory Visit (INDEPENDENT_AMBULATORY_CARE_PROVIDER_SITE_OTHER): Payer: Medicare Other | Admitting: Cardiology

## 2016-09-14 ENCOUNTER — Encounter: Payer: Self-pay | Admitting: Cardiology

## 2016-09-14 VITALS — BP 110/69 | HR 84 | Ht 70.0 in | Wt 123.4 lb

## 2016-09-14 DIAGNOSIS — N183 Chronic kidney disease, stage 3 unspecified: Secondary | ICD-10-CM

## 2016-09-14 DIAGNOSIS — I359 Nonrheumatic aortic valve disorder, unspecified: Secondary | ICD-10-CM | POA: Diagnosis not present

## 2016-09-14 DIAGNOSIS — E7800 Pure hypercholesterolemia, unspecified: Secondary | ICD-10-CM | POA: Diagnosis not present

## 2016-09-14 MED ORDER — FUROSEMIDE 80 MG PO TABS: 80.0000 mg | ORAL_TABLET | Freq: Every day | ORAL | 3 refills | Status: DC

## 2016-09-14 NOTE — Patient Instructions (Signed)
Medication Instructions:   INCREASE FUROSEMIDE TO 80 MG ONCE DAILY  Labwork:  Your physician recommends that you return for lab work in: ONE WEEK  Follow-Up:  Your physician wants you to follow-up in: 6 MONTHS WITH DR Jens SomRENSHAW You will receive a reminder letter in the mail two months in advance. If you don't receive a letter, please call our office to schedule the follow-up appointment.   If you need a refill on your cardiac medications before your next appointment, please call your pharmacy.

## 2016-09-14 NOTE — Progress Notes (Signed)
HPI: FU aortic insufficiency. Last echocardiogram November 2017 showed normal LV function, grade 2 diastolic dysfunction, moderate aortic stenosis with mean gradient 32 mmHg, mild aortic insufficiency, moderate mitral stenosis, mild to moderate mitral regurgitation, moderate left atrial enlargement. Since last seen, patient denies dyspnea, chest pain, palpitations or syncope. In the past few weeks he has had increased pedal edema right greater than left.  Current Outpatient Prescriptions  Medication Sig Dispense Refill  . AMBULATORY NON FORMULARY MEDICATION Collapsible light weight wheelchair.  Use daily as needed for mobility.  Dx: Osteoarthritis 1 Units 0  . atorvastatin (LIPITOR) 10 MG tablet TAKE ONE TABLET BY MOUTH ONCE DAILY 90 tablet 3  . betaxolol (BETOPTIC-S) 0.25 % ophthalmic suspension Place 1 drop into both eyes 2 (two) times daily. 5 mL 3  . cetirizine (ZYRTEC) 10 MG tablet Take 1 tablet (10 mg total) by mouth daily. 30 tablet 11  . Docusate Sodium (COLACE PO) Take 1 capsule by mouth daily.     . furosemide (LASIX) 20 MG tablet TAKE THREE TABLETS BY MOUTH ONCE DAILY. MAY  TAKE  1  EXTRA  TABLET  BY  MOUTH  DAILY  AS  NEEDED  FOR  SWELLING 360 tablet 3  . hydrOXYzine (ATARAX/VISTARIL) 10 MG tablet Take 1 tablet (10 mg total) by mouth 3 (three) times daily as needed. 90 tablet 5  . LINZESS 145 MCG CAPS capsule TAKE 1 CAPSULE BY MOUTH ONCE DAILY BEFORE BREAKFAST. 30 capsule 2  . Multiple Vitamins-Minerals (CENTRUM SILVER PO) Take 1 tablet by mouth daily.     . Multiple Vitamins-Minerals (PRESERVISION AREDS PO) Take 1 capsule by mouth 2 (two) times daily.     Marland Kitchen. oxyCODONE-acetaminophen (PERCOCET) 7.5-325 MG tablet Take 1 tablet by mouth every 6 (six) hours as needed for moderate pain or severe pain. 120 tablet 0  . Potassium Gluconate 550 MG TABS Take 1 tablet (550 mg total) by mouth daily. 90 each 1  . ranitidine (ZANTAC) 150 MG tablet Take 1 tablet (150 mg total) by mouth 2 (two)  times daily. 60 tablet 11  . Saw Palmetto, Serenoa repens, (SAW PALMETTO PO) Take 1 tablet by mouth daily.     Marland Kitchen. spironolactone (ALDACTONE) 25 MG tablet TAKE 1 TABLET BY MOUTH ONCE DAILY 30 tablet 2  . tamsulosin (FLOMAX) 0.4 MG CAPS capsule TAKE 2 CAPSULES BY MOUTH ONCE DAILY 180 capsule 0   No current facility-administered medications for this visit.      Past Medical History:  Diagnosis Date  . Aortic stenosis    and aortic insufficiency  . Arthritis    "bilateral knees and right shoulder"  . Bladder infection    hx of  . Bladder stone 10/07/2011  . BPH (benign prostatic hyperplasia) 10/07/2011  . GERD (gastroesophageal reflux disease)    only take medication as needed  . Hematuria 10/07/2011  . Hyperlipidemia   . Hypertension    Dr. Laren BoomSean Hommel, [facility]  . Migraines    "went away inthe early 1940's" (12/05/2011)  . Nitrate poisoning 1945  . Pneumonia    "had it very frequently when I was a child" (12/05/2011)  . Stomach ulcer 1945   "caused by nitrate poisoning" (12/05/2011)  . Tremors of nervous system   . Unintentional weight loss 10/07/2011    Past Surgical History:  Procedure Laterality Date  . CIRCUMCISION     1996  . INGUINAL HERNIA REPAIR  12/05/2011   bilaterally  . INGUINAL HERNIA  REPAIR  12/05/2011   Procedure: HERNIA REPAIR INGUINAL ADULT BILATERAL;  Surgeon: [name]. [name], MD;  Location: [facility] OR;  Service: General;  Laterality: Bilateral;  . INNER EAR SURGERY  1948   "left; perforated eardrum now" (12/05/2011)  . INSERTION OF MESH  12/05/2011   Procedure: INSERTION OF MESH;  Surgeon: [name]. [name], MD;  Location: [facility] OR;  Service: General;  Laterality: Bilateral;  . TONSILLECTOMY  1946    Social History   Social History  . Marital status: Married    Spouse name: N/A  . Number of children: 3  . Years of education: HS   Occupational History  . Retired    Social History Main Topics  . Smoking status: Former Smoker    Packs/day:  0.50    Years: 5.00    Types: Cigarettes  . Smokeless tobacco: Never Used     Comment: 12/05/2011 "haven't smoked since age [age]"  . Alcohol use No  . Drug use: No  . Sexual activity: No   Other Topics Concern  . Not on file   Social History Narrative   Lives at home with his son.   Right-handed.   1 cup caffeine per daily.    Family History  Problem Relation Age of Onset  . Heart attack Mother   . Stroke Mother   . Cancer Mother        cervial and ovarian  . Diabetes Father   . Heart Problems Brother        ruptured heart    ROS: Pain from hernia but no fevers or chills, productive cough, hemoptysis, dysphasia, odynophagia, melena, hematochezia, dysuria, hematuria, rash, seizure activity, orthopnea, PND, claudication. Remaining systems are negative.  Physical Exam: Well-developed frail in no acute distress.  Skin is warm and dry.  HEENT is normal.  Neck is supple.  Chest is clear to auscultation with normal expansion.  Cardiovascular exam is regular rate and rhythm. 3/6 systolic murmur, S2 diminished Abdominal exam nontender or distended. No masses palpated. Extremities show 1+ right ankle edema. neuro grossly intact  ECG- Sinus rhythm with PACs and PVCs. Left axis deviation. personally reviewed  A/P  1 aortic stenosis/aortic insufficiency-patient has significant aortic stenosis (severe on exam) and aortic insufficiency. He is clear he only wants medical therapy and would never consider TAVR. He does not want follow-up imaging studies which is reasonable given his age. He is mildly volume overloaded. Increase Lasix to 80 mg daily. Check potassium and renal function in 1 week.  2 mitral stenosis/mitral insufficiency-as outlined above we will plan conservative measures.  3 hyperlipidemia-continue statin.   Olga Millers, MD

## 2016-09-20 ENCOUNTER — Ambulatory Visit (INDEPENDENT_AMBULATORY_CARE_PROVIDER_SITE_OTHER): Payer: Medicare Other | Admitting: Physician Assistant

## 2016-09-20 ENCOUNTER — Encounter: Payer: Self-pay | Admitting: Physician Assistant

## 2016-09-20 VITALS — BP 127/67 | HR 79

## 2016-09-20 DIAGNOSIS — R600 Localized edema: Secondary | ICD-10-CM | POA: Diagnosis not present

## 2016-09-20 DIAGNOSIS — I359 Nonrheumatic aortic valve disorder, unspecified: Secondary | ICD-10-CM | POA: Diagnosis not present

## 2016-09-20 DIAGNOSIS — N183 Chronic kidney disease, stage 3 unspecified: Secondary | ICD-10-CM

## 2016-09-20 LAB — BASIC METABOLIC PANEL
BUN/Creatinine Ratio: 16 (calc) (ref 6–22)
BUN: 31 mg/dL — ABNORMAL HIGH (ref 7–25)
CO2: 28 mmol/L (ref 20–32)
Calcium: 9.5 mg/dL (ref 8.6–10.3)
Chloride: 102 mmol/L (ref 98–110)
Creat: 1.88 mg/dL — ABNORMAL HIGH (ref 0.70–1.11)
Glucose, Bld: 101 mg/dL — ABNORMAL HIGH (ref 65–99)
Potassium: 4.8 mmol/L (ref 3.5–5.3)
Sodium: 138 mmol/L (ref 135–146)

## 2016-09-20 NOTE — Progress Notes (Signed)
Subjective:    Patient ID: Roberto Day, male    DOB: [birth year], 83 y.o.   MRN: [id number]  HPI  Pt is a 83 yo male with AVD who presents to the clinic to follow up on right lower extremity edema. He was orginally seen by Dr. Denyse Amass and BNP was mildly elevated. He was seen by Dr. Jens Som on 9/12 and lasix was increased to  daily. He is having his kidney checked today to make sure stable. His swelling has improved a lot. He is using compression stockings most days. No worsening SOB or weakness.   .. Active Ambulatory Problems    Diagnosis Date Noted  . Hematuria 10/07/2011  . Inguinal hernia, bilateral 10/07/2011  . Unintentional weight loss 10/07/2011  . BPH (benign prostatic hyperplasia) 10/07/2011  . Bladder stone 10/07/2011  . Elevated PSA (6.1 05/2011), (6.5 07/2011) (7.6 10/13) (7.5 5/14) 10/11/2011  . Other and unspecified hyperlipidemia 10/12/2011  . UTI (lower urinary tract infection) 10/17/2011  . Hyperlipidemia 10/17/2011  . Osteopenia (Femur T= -1.9 11/2011) 10/17/2011  . Chronic back pain 10/17/2011  . Rheumatoid arthritis (per outside records) 10/17/2011  . Glaucoma 10/17/2011  . CKD (chronic kidney disease) stage 3, GFR 30-59 ml/min 10/17/2011  . 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 Diarrhea 02/10/2012  . Polypharmacy 02/21/2012  . Diverticulosis of colon without hemorrhage 03/05/2012  . Osteoarthritis of both knees 06/20/2012  . Osteoarthritis of right glenohumeral joint 06/27/2012  . Aortic regurgitation 11/15/2012  . Mitral valve stenosis 11/15/2012  . Pulmonary fibrosis (HCC) 12/10/2012  . Tremor 05/07/2013  . Solitary pulmonary nodule 05/08/2013  . Glaucoma suspect 07/01/2013  . Atherosclerosis of aorta (HCC) 11/08/2013  . Essential tremor 02/18/2014  . Hyperglycemia 05/05/2014  . Allergic rhinitis 07/24/2014  . Abnormality of gait 12/03/2014  . Excess ear wax 02/24/2015  . Epigastric pain 08/06/2015   . Swelling of right lower extremity 10/12/2015  . H/O inguinal hernia repair 10/12/2015  . Encounter for chronic pain management 11/28/2015  . Decubitus ulcer of left hip, stage 1 02/25/2016  . Generalized edema 02/25/2016  . Chronic pain syndrome 03/23/2016   Resolved Ambulatory Problems    Diagnosis Date Noted  . Pulmonary nodule, left 10/17/2011  . Pulmonary nodule 11/21/2012  . Vision loss 02/18/2014  . CAP (community acquired pneumonia) 02/12/2015   Past Medical History:  Diagnosis Date  . Aortic stenosis   . Arthritis   . Bladder infection   . Bladder stone 10/07/2011  . BPH (benign prostatic hyperplasia) 10/07/2011  . GERD (gastroesophageal reflux disease)   . Hematuria 10/07/2011  . Hyperlipidemia   . Hypertension   . Migraines   . Nitrate poisoning 1945  . Pneumonia   . Stomach ulcer 1945  . Tremors of nervous system   . Unintentional weight loss 10/07/2011      Review of Systems  All other systems reviewed and are negative.      Objective:   Physical Exam  Constitutional: He appears well-developed and well-nourished.  Cardiovascular: Normal rate and regular rhythm.   5/6 systolic murmur.  Pulmonary/Chest: Effort normal and breath sounds normal. He has no wheezes.  Neurological:  Right 2+ pitting edema around ankles.           Assessment & Plan:  Marland KitchenMarland KitchenDiagnoses and all orders for this visit:  Edema of right lower extremity  AVD (aortic valve disease)  CKD (chronic kidney disease) stage 3, GFR 30-59 ml/min   Continue lasix   daily. Watch salt and correlation to swelling. Keep compression stockings on and keep feet elevated. Follow up as needed. Pt denies any SOB changes.

## 2016-09-21 ENCOUNTER — Encounter: Payer: Self-pay | Admitting: *Deleted

## 2016-09-22 ENCOUNTER — Other Ambulatory Visit: Payer: Self-pay

## 2016-09-22 ENCOUNTER — Telehealth: Payer: Self-pay

## 2016-09-22 DIAGNOSIS — M170 Bilateral primary osteoarthritis of knee: Secondary | ICD-10-CM

## 2016-09-22 DIAGNOSIS — M5134 Other intervertebral disc degeneration, thoracic region: Secondary | ICD-10-CM

## 2016-09-22 DIAGNOSIS — M19011 Primary osteoarthritis, right shoulder: Secondary | ICD-10-CM

## 2016-09-22 DIAGNOSIS — G894 Chronic pain syndrome: Secondary | ICD-10-CM

## 2016-09-22 MED ORDER — OXYCODONE-ACETAMINOPHEN 7.5-325 MG PO TABS: 1.0000 | ORAL_TABLET | Freq: Four times a day (QID) | ORAL | 0 refills | Status: DC | PRN

## 2016-09-22 NOTE — Telephone Encounter (Signed)
Pt need a refill on percocet, if ok, I will print and place in your box.

## 2016-09-22 NOTE — Telephone Encounter (Signed)
In your box

## 2016-09-22 NOTE — Telephone Encounter (Signed)
Ok to print. I will sign tomorrow.

## 2016-10-14 DIAGNOSIS — M1712 Unilateral primary osteoarthritis, left knee: Secondary | ICD-10-CM | POA: Diagnosis not present

## 2016-10-14 DIAGNOSIS — M19012 Primary osteoarthritis, left shoulder: Secondary | ICD-10-CM | POA: Diagnosis not present

## 2016-10-14 DIAGNOSIS — M19011 Primary osteoarthritis, right shoulder: Secondary | ICD-10-CM | POA: Diagnosis not present

## 2016-10-14 DIAGNOSIS — M1711 Unilateral primary osteoarthritis, right knee: Secondary | ICD-10-CM | POA: Diagnosis not present

## 2016-10-19 ENCOUNTER — Other Ambulatory Visit: Payer: Self-pay | Admitting: Physician Assistant

## 2016-10-19 DIAGNOSIS — M170 Bilateral primary osteoarthritis of knee: Secondary | ICD-10-CM

## 2016-10-19 DIAGNOSIS — M5134 Other intervertebral disc degeneration, thoracic region: Secondary | ICD-10-CM

## 2016-10-19 DIAGNOSIS — M19011 Primary osteoarthritis, right shoulder: Secondary | ICD-10-CM

## 2016-10-19 DIAGNOSIS — G894 Chronic pain syndrome: Secondary | ICD-10-CM

## 2016-10-20 MED ORDER — OXYCODONE-ACETAMINOPHEN 7.5-325 MG PO TABS: 1.0000 | ORAL_TABLET | Freq: Four times a day (QID) | ORAL | 0 refills | Status: DC | PRN

## 2016-10-20 NOTE — Telephone Encounter (Signed)
[name] notified

## 2016-10-31 ENCOUNTER — Telehealth: Payer: Self-pay | Admitting: Cardiology

## 2016-10-31 NOTE — Telephone Encounter (Signed)
Pt c/o Shortness Of Breath: STAT if SOB developed within the last 24 hours or pt is noticeably SOB on the phone  1. Are you currently SOB (can you hear that pt is SOB on the phone)? pt daughter n law calling  She is with him   2. How long have you been experiencing SOB?  today  3. Are you SOB when sitting or when up moving around? Up moving around  4. Are you currently experiencing any other symptoms? no

## 2016-10-31 NOTE — Telephone Encounter (Signed)
Received a call from patient's daughter n law [name].She stated patient has had 2 episodes of sob.One episode this past Friday and he had sob while cooking breakfast this morning.No sob at present. Stated he wanted to see Dr.Crenshaw in [place] office this week.He refuses to see a PA.Advised Dr.Crenshaw's RN out of office today.I will send message to her.

## 2016-11-01 NOTE — Telephone Encounter (Signed)
Spoke with pt dtr, he has not had any problems today. She is [place]

## 2016-11-03 NOTE — Telephone Encounter (Signed)
Spoke with pt dtr, Follow up scheduled  

## 2016-11-04 ENCOUNTER — Ambulatory Visit (INDEPENDENT_AMBULATORY_CARE_PROVIDER_SITE_OTHER): Payer: Medicare Other | Admitting: Physician Assistant

## 2016-11-04 ENCOUNTER — Encounter: Payer: Self-pay | Admitting: Physician Assistant

## 2016-11-04 VITALS — BP 118/86 | HR 86 | Ht 70.0 in | Wt 117.0 lb

## 2016-11-04 DIAGNOSIS — K4090 Unilateral inguinal hernia, without obstruction or gangrene, not specified as recurrent: Secondary | ICD-10-CM | POA: Diagnosis not present

## 2016-11-04 DIAGNOSIS — R0602 Shortness of breath: Secondary | ICD-10-CM | POA: Diagnosis not present

## 2016-11-04 DIAGNOSIS — K5903 Drug induced constipation: Secondary | ICD-10-CM | POA: Diagnosis not present

## 2016-11-04 DIAGNOSIS — F419 Anxiety disorder, unspecified: Secondary | ICD-10-CM | POA: Diagnosis not present

## 2016-11-04 MED ORDER — LINACLOTIDE 290 MCG PO CAPS: 290.0000 ug | ORAL_CAPSULE | Freq: Every day | ORAL | 5 refills | Status: DC

## 2016-11-04 MED ORDER — DICYCLOMINE HCL 10 MG PO CAPS: 10.0000 mg | ORAL_CAPSULE | Freq: Three times a day (TID) | ORAL | 2 refills | Status: DC

## 2016-11-04 NOTE — Patient Instructions (Addendum)
Increase linzess to 290mcg.  Start bentyl with meals.  Watch caffiene  Diet for Irritable Bowel Syndrome When you have irritable bowel syndrome (IBS), the foods you eat and your eating habits are very important. IBS may cause various symptoms, such as abdominal pain, constipation, or diarrhea. Choosing the right foods can help ease discomfort caused by these symptoms. Work with your health care provider and dietitian to find the best eating plan to help control your symptoms. What general guidelines do I need to follow?  Keep a food diary. This will help you identify foods that cause symptoms. Write down: ? What you eat and when. ? What symptoms you have. ? When symptoms occur in relation to your meals.  Avoid foods that cause symptoms. Talk with your dietitian about other ways to get the same nutrients that are in these foods.  Eat more foods that contain fiber. Take a fiber supplement if directed by your dietitian.  Eat your meals slowly, in a relaxed setting.  Aim to eat 5-6 small meals per day. Do not skip meals.  Drink enough fluids to keep your urine clear or pale yellow.  Ask your health care provider if you should take an over-the-counter probiotic during flare-ups to help restore healthy gut bacteria.  If you have cramping or diarrhea, try making your meals low in fat and high in carbohydrates. Examples of carbohydrates are pasta, rice, whole grain breads and cereals, fruits, and vegetables.  If dairy products cause your symptoms to flare up, try eating less of them. You might be able to handle yogurt better than other dairy products because it contains bacteria that help with digestion. What foods are not recommended? The following are some foods and drinks that may worsen your symptoms:  Fatty foods, such as [place] fries.  Milk products, such as cheese or ice cream.  Chocolate.  Alcohol.  Products with caffeine, such as coffee.  Carbonated drinks, such as  soda.  The items listed above may not be a complete list of foods and beverages to avoid. Contact your dietitian for more information. What foods are good sources of fiber? Your health care provider or dietitian may recommend that you eat more foods that contain fiber. Fiber can help reduce constipation and other IBS symptoms. Add foods with fiber to your diet a little at a time so that your body can get used to them. Too much fiber at once might cause gas and swelling of your abdomen. The following are some foods that are good sources of fiber:  Apples.  Peaches.  Pears.  Berries.  Figs.  Broccoli (raw).  Cabbage.  Carrots.  Raw peas.  Kidney beans.  Lima beans.  Whole grain bread.  Whole grain cereal.  Where to find more information: [employer] for Functional Gastrointestinal Disorders: www.iffgd.[employer] National Institute of Diabetes and Digestive and Kidney Diseases: http://norris-lawson.com/www.niddk.nih.gov/health-information/health-topics/digestive-diseases/ibs/Pages/facts.aspx This information is not intended to replace advice given to you by your health care provider. Make sure you discuss any questions you have with your health care provider. Document Released: 03/12/2003 Document Revised: 05/28/2015 Document Reviewed: 03/22/2013 Elsevier Interactive Patient Education  2018 [employer]

## 2016-11-04 NOTE — Progress Notes (Signed)
Subjective:    Patient ID: Roberto Day, male    DOB: Sep 06, 1921, 83 y.o.   MRN: [id number]  HPI  Pt is a 83 yo male with aortic stenosis and regurgitation and right recurrent inginal hernia who presents to the clinic to discuss complications from hernia. He complains of feeling SOB and very anxious when he eats and hernia pops out. He takes gasx which helps. He admits to constipation and on linzess, ducolax and miralax. Pt's daughter in law just wants to make sure SOB not for any other reason. He denies any fever, chills, cough. He has some mild peripheral edema. Cardiology did increase lasix at last visit. Recheck on the 12th.   .. Active Ambulatory Problems    Diagnosis Date Noted  . Hematuria 10/07/2011  . Inguinal hernia, bilateral 10/07/2011  . Unintentional weight loss 10/07/2011  . BPH (benign prostatic hyperplasia) 10/07/2011  . Bladder stone 10/07/2011  . Elevated PSA (6.1 05/2011), (6.5 07/2011) (7.6 10/13) (7.5 5/14) 10/11/2011  . Other and unspecified hyperlipidemia 10/12/2011  . UTI (lower urinary tract infection) 10/17/2011  . Hyperlipidemia 10/17/2011  . Osteopenia (Femur T= -1.9 11/2011) 10/17/2011  . Chronic back pain 10/17/2011  . Rheumatoid arthritis (per outside records) 10/17/2011  . Glaucoma 10/17/2011  . CKD (chronic kidney disease) stage 3, GFR 30-59 ml/min (HCC) 10/17/2011  . 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 Diarrhea 02/10/2012  . Polypharmacy 02/21/2012  . Diverticulosis of colon without hemorrhage 03/05/2012  . Osteoarthritis of both knees 06/20/2012  . Osteoarthritis of right glenohumeral joint 06/27/2012  . Aortic regurgitation 11/15/2012  . Mitral valve stenosis 11/15/2012  . Pulmonary fibrosis (HCC) 12/10/2012  . Tremor 05/07/2013  . Solitary pulmonary nodule 05/08/2013  . Glaucoma suspect 07/01/2013  . Atherosclerosis of aorta (HCC) 11/08/2013  . Essential tremor 02/18/2014  . Hyperglycemia  05/05/2014  . Allergic rhinitis 07/24/2014  . Abnormality of gait 12/03/2014  . Excess ear wax 02/24/2015  . Epigastric pain 08/06/2015  . Swelling of right lower extremity 10/12/2015  . H/O inguinal hernia repair 10/12/2015  . Encounter for chronic pain management 11/28/2015  . Decubitus ulcer of left hip, stage 1 02/25/2016  . Generalized edema 02/25/2016  . Chronic pain syndrome 03/23/2016   Resolved Ambulatory Problems    Diagnosis Date Noted  . Pulmonary nodule, left 10/17/2011  . Pulmonary nodule 11/21/2012  . Vision loss 02/18/2014  . CAP (community acquired pneumonia) 02/12/2015   Past Medical History:  Diagnosis Date  . Aortic stenosis   . Arthritis   . Bladder infection   . Bladder stone 10/07/2011  . BPH (benign prostatic hyperplasia) 10/07/2011  . GERD (gastroesophageal reflux disease)   . Hematuria 10/07/2011  . Hyperlipidemia   . Hypertension   . Migraines   . Nitrate poisoning 1945  . Pneumonia   . Stomach ulcer 1945  . Tremors of nervous system   . Unintentional weight loss 10/07/2011      Review of Systems  All other systems reviewed and are negative.      Objective:   Physical Exam  Constitutional: He is oriented to person, place, and time. He appears well-developed and well-nourished.  HENT:  Head: Normocephalic and atraumatic.  Cardiovascular: Normal rate, regular rhythm and normal heart sounds.  Pulmonary/Chest: Effort normal and breath sounds normal. He has no wheezes.  Neurological: He is alert and oriented to person, place, and time.  Skin:  1 plus pitting edema of right leg. Per patient  he did fall on right side this week.   Psychiatric: He has a normal mood and affect. His behavior is normal.          Assessment & Plan:  Marland Kitchen.Marland Kitchen.Diagnoses and all orders for this visit:  Drug-induced constipation -     linaclotide (LINZESS) 290 MCG CAPS capsule; Take 1 capsule (290 mcg total) by mouth daily before breakfast. -     dicyclomine (BENTYL)  10 MG capsule; Take 1 capsule (10 mg total) by mouth 4 (four) times daily -  before meals and at bedtime.  SOB (shortness of breath)  Anxiety  Right inguinal hernia   Reassurance that SOB is not due to lungs. His lungs sound great. Pulse ox 100 percent.  Mildly volume overloaded but I do not think that is what is causing SOB.  I believe when he gets into a panic with his inguinal hernia and then constipation adds to the problem.  Increase linzess. Start bentyl with meals.  Wear hernia belt. Lay down after meals Take vistaril as needed.

## 2016-11-07 ENCOUNTER — Encounter: Payer: Self-pay | Admitting: Physician Assistant

## 2016-11-07 DIAGNOSIS — H02831 Dermatochalasis of right upper eyelid: Secondary | ICD-10-CM | POA: Diagnosis not present

## 2016-11-07 DIAGNOSIS — H527 Unspecified disorder of refraction: Secondary | ICD-10-CM | POA: Diagnosis not present

## 2016-11-07 DIAGNOSIS — H02834 Dermatochalasis of left upper eyelid: Secondary | ICD-10-CM | POA: Diagnosis not present

## 2016-11-07 DIAGNOSIS — H353131 Nonexudative age-related macular degeneration, bilateral, early dry stage: Secondary | ICD-10-CM | POA: Diagnosis not present

## 2016-11-07 DIAGNOSIS — H5111 Convergence insufficiency: Secondary | ICD-10-CM | POA: Diagnosis not present

## 2016-11-07 DIAGNOSIS — H40003 Preglaucoma, unspecified, bilateral: Secondary | ICD-10-CM | POA: Diagnosis not present

## 2016-11-08 NOTE — Progress Notes (Signed)
HPI: FU aortic insufficiency. Last echocardiogram November 2017 showed normal LV function, grade 2 diastolic dysfunction, moderate aortic stenosis with mean gradient 32 mmHg, mild aortic insufficiency, moderate mitral stenosis, mild to moderate mitral regurgitation, moderate left atrial enlargement. At last office visit we had discussions concerning aggressiveness of care including TAVR and pt wanted only medical therapy. He did not want to pursue fu echos. Lasix was increased. Since last seen, he has limited mobility and ambulates with a walker. He denies chest pain or syncope but does have some dyspnea on exertion.Pedal edema has improved.  Current Outpatient Medications  Medication Sig Dispense Refill  . AMBULATORY NON FORMULARY MEDICATION Collapsible light weight wheelchair.  Use daily as needed for mobility.  Dx: Osteoarthritis 1 Units 0  . atorvastatin (LIPITOR) 10 MG tablet TAKE ONE TABLET BY MOUTH ONCE DAILY 90 tablet 3  . betaxolol (BETOPTIC-S) 0.25 % ophthalmic suspension Place 1 drop into both eyes 2 (two) times daily. 5 mL 3  . cetirizine (ZYRTEC) 10 MG tablet Take 1 tablet (10 mg total) by mouth daily. 30 tablet 11  . dicyclomine (BENTYL) 10 MG capsule Take 1 capsule (10 mg total) by mouth 4 (four) times daily -  before meals and at bedtime. 90 capsule 2  . Docusate Sodium (COLACE PO) Take 1 capsule by mouth daily.     . furosemide (LASIX) 80 MG tablet Take 1 tablet (80 mg total) by mouth daily. MAY  TAKE  1  EXTRA  TABLET  BY  MOUTH  DAILY  AS  NEEDED  FOR  SWELLING 90 tablet 3  . hydrOXYzine (ATARAX/VISTARIL) 10 MG tablet Take 1 tablet (10 mg total) by mouth 3 (three) times daily as needed. 90 tablet 5  . linaclotide (LINZESS) 290 MCG CAPS capsule Take 1 capsule (290 mcg total) by mouth daily before breakfast. 30 capsule 5  . Multiple Vitamins-Minerals (CENTRUM SILVER PO) Take 1 tablet by mouth daily.     . Multiple Vitamins-Minerals (PRESERVISION AREDS PO) Take 1 capsule by  mouth 2 (two) times daily.     Marland Kitchen oxyCODONE-acetaminophen (PERCOCET) 7.5-325 MG tablet Take 1 tablet by mouth every 6 (six) hours as needed for moderate pain or severe pain. 120 tablet 0  . Potassium Gluconate 550 MG TABS Take 1 tablet (550 mg total) by mouth daily. 90 each 1  . ranitidine (ZANTAC) 150 MG tablet Take 1 tablet (150 mg total) by mouth 2 (two) times daily. 60 tablet 11  . Saw Palmetto, Serenoa repens, (SAW PALMETTO PO) Take 1 tablet by mouth daily.     Marland Kitchen spironolactone (ALDACTONE) 25 MG tablet TAKE 1 TABLET BY MOUTH ONCE DAILY 30 tablet 2  . tamsulosin (FLOMAX) 0.4 MG CAPS capsule TAKE 2 CAPSULES BY MOUTH ONCE DAILY 180 capsule 0   No current facility-administered medications for this visit.      Past Medical History:  Diagnosis Date  . Aortic stenosis    and aortic insufficiency  . Arthritis    "bilateral knees and right shoulder"  . Bladder infection    hx of  . Bladder stone 10/07/2011  . BPH (benign prostatic hyperplasia) 10/07/2011  . GERD (gastroesophageal reflux disease)    only take medication as needed  . Hematuria 10/07/2011  . Hyperlipidemia   . Hypertension    Dr. Laren Boom, [facility]  . Migraines    "went away inthe early 1940's" (12/05/2011)  . Nitrate poisoning 1945  . Pneumonia    "had  it very frequently when I was a child" (12/05/2011)  . Stomach ulcer 1945   "caused by nitrate poisoning" (12/05/2011)  . Tremors of nervous system   . Unintentional weight loss 10/07/2011    Past Surgical History:  Procedure Laterality Date  . CIRCUMCISION     1996  . INGUINAL HERNIA REPAIR  12/05/2011   bilaterally  . INNER EAR SURGERY  1948   "left; perforated eardrum now" (12/05/2011)  . TONSILLECTOMY  1946    Social History   Socioeconomic History  . Marital status: Married    Spouse name: Not on file  . Number of children: 3  . Years of education: HS  . Highest education level: Not on file  Social Needs  . Financial resource strain:  Not on file  . Food insecurity - worry: Not on file  . Food insecurity - inability: Not on file  . Transportation needs - medical: Not on file  . Transportation needs - non-medical: Not on file  Occupational History  . Occupation: Retired  Tobacco Use  . Smoking status: Former Smoker    Packs/day: 0.50    Years: 5.00    Pack years: 2.50    Types: Cigarettes  . Smokeless tobacco: Never Used  . Tobacco comment: 12/05/2011 "haven't smoked since age [age]"  Substance and Sexual Activity  . Alcohol use: No    Alcohol/week: 0.0 oz  . Drug use: No  . Sexual activity: No  Other Topics Concern  . Not on file  Social History Narrative   Lives at home with his son.   Right-handed.   1 cup caffeine per daily.    Family History  Problem Relation Age of Onset  . Heart attack Mother   . Stroke Mother   . Cancer Mother        cervial and ovarian  . Diabetes Father   . Heart Problems Brother        ruptured heart    ROS: arthralgias but no fevers or chills, productive cough, hemoptysis, dysphasia, odynophagia, melena, hematochezia, dysuria, hematuria, rash, seizure activity, orthopnea, PND, claudication. Remaining systems are negative.  Physical Exam: Well-developed frail in no acute distress.  Skin is warm and dry.  HEENT is normal.  Neck is supple.  Chest is clear to auscultation with normal expansion.  Cardiovascular exam is regular rate and rhythm. 3/6 systolic murmur Abdominal exam nontender or distended. No masses palpated. Extremities show 1+ ankle edema. neuro grossly intact  Electrocardiogram shows sinus rhythm, left axis deviation and no ST changes. A/P  1 aortic stenosis/aortic insufficiency-I again discussed patient's valvular heart disease with he and his daughter. I explained that aortic stenosis is a progressive disease and will worsen. He is clearly not a candidate for open aortic valve replacement and declines any further imaging or consideration of TAVR which I  think is reasonable. He understands this will ultimately take his life. We will continue with present dose of Lasix as his volume status has improved. Check potassium and renal function.  2 mitral stenosis/mitral insufficiency-discussion as per #1.  3 hyperlipidemia-continue statin.  Olga MillersBrian Steward Sames, MD

## 2016-11-14 ENCOUNTER — Ambulatory Visit: Payer: Medicare Other | Admitting: Cardiology

## 2016-11-14 ENCOUNTER — Encounter: Payer: Self-pay | Admitting: Cardiology

## 2016-11-14 VITALS — BP 116/60 | HR 87 | Ht 68.0 in | Wt 119.0 lb

## 2016-11-14 DIAGNOSIS — I342 Nonrheumatic mitral (valve) stenosis: Secondary | ICD-10-CM

## 2016-11-14 DIAGNOSIS — E7800 Pure hypercholesterolemia, unspecified: Secondary | ICD-10-CM

## 2016-11-14 DIAGNOSIS — I359 Nonrheumatic aortic valve disorder, unspecified: Secondary | ICD-10-CM

## 2016-11-14 NOTE — Patient Instructions (Signed)

## 2016-11-15 LAB — BASIC METABOLIC PANEL
BUN / CREAT RATIO: 17 (ref 10–24)
BUN: 27 mg/dL (ref 10–36)
CO2: 26 mmol/L (ref 20–29)
CREATININE: 1.6 mg/dL — AB (ref 0.76–1.27)
Calcium: 9.6 mg/dL (ref 8.6–10.2)
Chloride: 98 mmol/L (ref 96–106)
GFR calc Af Amer: 42 mL/min/{1.73_m2} — ABNORMAL LOW (ref >59)
GFR, EST NON AFRICAN AMERICAN: 37 mL/min/{1.73_m2} — AB (ref >59)
Glucose: 87 mg/dL (ref 65–99)
POTASSIUM: 4.4 mmol/L (ref 3.5–5.2)
SODIUM: 145 mmol/L — AB (ref 134–144)

## 2016-11-15 NOTE — Addendum Note (Signed)
Addended by: Raelyn NumberWILLIAMSON, Tosca Pletz L on: 11/15/2016 01:48 PM   Modules accepted: Orders

## 2016-11-18 ENCOUNTER — Other Ambulatory Visit: Payer: Self-pay | Admitting: Physician Assistant

## 2016-11-21 ENCOUNTER — Other Ambulatory Visit: Payer: Self-pay | Admitting: Physician Assistant

## 2016-11-21 ENCOUNTER — Other Ambulatory Visit: Payer: Self-pay

## 2016-11-21 ENCOUNTER — Telehealth: Payer: Self-pay | Admitting: Physician Assistant

## 2016-11-21 DIAGNOSIS — M19011 Primary osteoarthritis, right shoulder: Secondary | ICD-10-CM

## 2016-11-21 DIAGNOSIS — M5134 Other intervertebral disc degeneration, thoracic region: Secondary | ICD-10-CM

## 2016-11-21 DIAGNOSIS — R601 Generalized edema: Secondary | ICD-10-CM

## 2016-11-21 DIAGNOSIS — M170 Bilateral primary osteoarthritis of knee: Secondary | ICD-10-CM

## 2016-11-21 DIAGNOSIS — G894 Chronic pain syndrome: Secondary | ICD-10-CM

## 2016-11-21 MED ORDER — OXYCODONE-ACETAMINOPHEN 7.5-325 MG PO TABS: 1.0000 | ORAL_TABLET | Freq: Four times a day (QID) | ORAL | 0 refills | Status: DC | PRN

## 2016-11-21 NOTE — Telephone Encounter (Signed)
Daughter in law picked up pt Percocet  prescription on 11/21/16.Thanks

## 2016-11-28 DIAGNOSIS — M170 Bilateral primary osteoarthritis of knee: Secondary | ICD-10-CM | POA: Diagnosis not present

## 2016-11-28 DIAGNOSIS — M19012 Primary osteoarthritis, left shoulder: Secondary | ICD-10-CM | POA: Diagnosis not present

## 2016-11-28 DIAGNOSIS — M19011 Primary osteoarthritis, right shoulder: Secondary | ICD-10-CM | POA: Diagnosis not present

## 2016-11-29 ENCOUNTER — Encounter: Payer: Self-pay | Admitting: Physician Assistant

## 2016-11-29 ENCOUNTER — Ambulatory Visit (INDEPENDENT_AMBULATORY_CARE_PROVIDER_SITE_OTHER): Payer: Medicare Other | Admitting: Physician Assistant

## 2016-11-29 ENCOUNTER — Telehealth: Payer: Self-pay

## 2016-11-29 VITALS — BP 133/60 | HR 83

## 2016-11-29 DIAGNOSIS — N183 Chronic kidney disease, stage 3 unspecified: Secondary | ICD-10-CM

## 2016-11-29 DIAGNOSIS — M5134 Other intervertebral disc degeneration, thoracic region: Secondary | ICD-10-CM | POA: Diagnosis not present

## 2016-11-29 DIAGNOSIS — K5903 Drug induced constipation: Secondary | ICD-10-CM

## 2016-11-29 DIAGNOSIS — G894 Chronic pain syndrome: Secondary | ICD-10-CM

## 2016-11-29 DIAGNOSIS — K4021 Bilateral inguinal hernia, without obstruction or gangrene, recurrent: Secondary | ICD-10-CM | POA: Diagnosis not present

## 2016-11-29 DIAGNOSIS — M170 Bilateral primary osteoarthritis of knee: Secondary | ICD-10-CM

## 2016-11-29 DIAGNOSIS — M19011 Primary osteoarthritis, right shoulder: Secondary | ICD-10-CM | POA: Diagnosis not present

## 2016-11-29 MED ORDER — OXYCODONE-ACETAMINOPHEN 7.5-325 MG PO TABS: 1.0000 | ORAL_TABLET | Freq: Four times a day (QID) | ORAL | 0 refills | Status: DC | PRN

## 2016-11-29 NOTE — Telephone Encounter (Signed)
Pt had appointment with Jade this morning.

## 2016-11-29 NOTE — Telephone Encounter (Signed)
Roberto Day called for a Handicap placard form.

## 2016-11-30 ENCOUNTER — Other Ambulatory Visit: Payer: Self-pay | Admitting: Physician Assistant

## 2016-11-30 ENCOUNTER — Encounter: Payer: Self-pay | Admitting: Physician Assistant

## 2016-11-30 DIAGNOSIS — K5903 Drug induced constipation: Secondary | ICD-10-CM | POA: Insufficient documentation

## 2016-11-30 NOTE — Progress Notes (Signed)
Subjective:    Patient ID: Roberto Day, male    DOB: 09/17/1921, 83 y.o.   MRN: [id number]  HPI Pt is a 83 yo pleasant male with AS, MVP, Pulmonary Fibrosis, CKD, and chronic pain who comes in for refills and to follow up on linzess and bentyl with lower abdominal pain from hernia.   Pt has ongoing constipation due to opoids and lower abdominal pain where his hernias bother him. At one point it was determined he had a lot of gas in his gut. After he eats he feels like the gas get trapped in hernia. He is using as needed gasX. We started bentyl and he does feel like it is helping. His daughter has noticed he is eating more. He takes linzess daily.   Chronic pain- he is on regimen of oxycodone. He is well controlled. He avoids NSAIDs due to CKD.   .. Active Ambulatory Problems    Diagnosis Date Noted  . Hematuria 10/07/2011  . Inguinal hernia, bilateral 10/07/2011  . Unintentional weight loss 10/07/2011  . BPH (benign prostatic hyperplasia) 10/07/2011  . Bladder stone 10/07/2011  . Elevated PSA (6.1 05/2011), (6.5 07/2011) (7.6 10/13) (7.5 5/14) 10/11/2011  . Other and unspecified hyperlipidemia 10/12/2011  . Hyperlipidemia 10/17/2011  . Osteopenia (Femur T= -1.9 11/2011) 10/17/2011  . Chronic back pain 10/17/2011  . Rheumatoid arthritis (per outside records) 10/17/2011  . Glaucoma 10/17/2011  . CKD (chronic kidney disease) stage 3, GFR 30-59 ml/min (HCC) 10/17/2011  . 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 Diarrhea 02/10/2012  . Polypharmacy 02/21/2012  . Diverticulosis of colon without hemorrhage 03/05/2012  . Osteoarthritis of both knees 06/20/2012  . Osteoarthritis of right glenohumeral joint 06/27/2012  . Aortic regurgitation 11/15/2012  . Mitral valve stenosis 11/15/2012  . Pulmonary fibrosis (HCC) 12/10/2012  . Tremor 05/07/2013  . Solitary pulmonary nodule 05/08/2013  . Glaucoma suspect 07/01/2013  . Atherosclerosis of aorta  (HCC) 11/08/2013  . Essential tremor 02/18/2014  . Hyperglycemia 05/05/2014  . Allergic rhinitis 07/24/2014  . Abnormality of gait 12/03/2014  . Excess ear wax 02/24/2015  . Epigastric pain 08/06/2015  . Swelling of right lower extremity 10/12/2015  . H/O inguinal hernia repair 10/12/2015  . Encounter for chronic pain management 11/28/2015  . Generalized edema 02/25/2016  . Chronic pain syndrome 03/23/2016  . Drug induced constipation 11/30/2016   Resolved Ambulatory Problems    Diagnosis Date Noted  . Pulmonary nodule, left 10/17/2011  . UTI (lower urinary tract infection) 10/17/2011  . Pulmonary nodule 11/21/2012  . Vision loss 02/18/2014  . CAP (community acquired pneumonia) 02/12/2015  . Decubitus ulcer of left hip, stage 1 02/25/2016   Past Medical History:  Diagnosis Date  . Aortic stenosis   . Arthritis   . Bladder infection   . Bladder stone 10/07/2011  . BPH (benign prostatic hyperplasia) 10/07/2011  . GERD (gastroesophageal reflux disease)   . Hematuria 10/07/2011  . Hyperlipidemia   . Hypertension   . Migraines   . Nitrate poisoning 1945  . Pneumonia   . Stomach ulcer 1945  . Tremors of nervous system   . Unintentional weight loss 10/07/2011      Review of Systems  All other systems reviewed and are negative.      Objective:   Physical Exam  Constitutional: He is oriented to person, place, and time. He appears well-developed and well-nourished.  In a wheelchair.   HENT:  Head: Normocephalic and atraumatic.  Cardiovascular: Normal rate.  5/6 SEM.   Pulmonary/Chest: Effort normal and breath sounds normal.  Neurological: He is alert and oriented to person, place, and time.  Psychiatric: He has a normal mood and affect. His behavior is normal.          Assessment & Plan:  .Marland Kitchen.Roberto Day was seen today for follow-up.  Diagnoses and all orders for this visit:  Drug induced constipation  Osteoarthritis of right glenohumeral joint -      oxyCODONE-acetaminophen (PERCOCET) 7.5-325 MG tablet; Take 1 tablet by mouth every 6 (six) hours as needed for moderate pain or severe pain.  Primary osteoarthritis of both knees -     oxyCODONE-acetaminophen (PERCOCET) 7.5-325 MG tablet; Take 1 tablet by mouth every 6 (six) hours as needed for moderate pain or severe pain.  Degenerative disc disease, thoracic T9/T10 (Consider MRI r/o Diskitis) -     oxyCODONE-acetaminophen (PERCOCET) 7.5-325 MG tablet; Take 1 tablet by mouth every 6 (six) hours as needed for moderate pain or severe pain.  Chronic pain syndrome -     oxyCODONE-acetaminophen (PERCOCET) 7.5-325 MG tablet; Take 1 tablet by mouth every 6 (six) hours as needed for moderate pain or severe pain.  Bilateral recurrent inguinal hernia without obstruction or gangrene  CKD (chronic kidney disease) stage 3, GFR 30-59 ml/min (HCC)  continue linzess, bentyl, and GasX for constipation and gas trapping in hernia. Follow up as needed.   Oxycodone refilled post dated to 12/18.    [place] controlled substance databased reviewed with no concerns.  New pain contract was signed. Follow up in 3 months.

## 2016-12-06 DIAGNOSIS — I6782 Cerebral ischemia: Secondary | ICD-10-CM | POA: Diagnosis not present

## 2016-12-06 DIAGNOSIS — G9340 Encephalopathy, unspecified: Secondary | ICD-10-CM | POA: Diagnosis not present

## 2016-12-06 DIAGNOSIS — G894 Chronic pain syndrome: Secondary | ICD-10-CM | POA: Diagnosis not present

## 2016-12-06 DIAGNOSIS — R600 Localized edema: Secondary | ICD-10-CM | POA: Diagnosis not present

## 2016-12-06 DIAGNOSIS — M7989 Other specified soft tissue disorders: Secondary | ICD-10-CM | POA: Diagnosis not present

## 2016-12-06 DIAGNOSIS — Z833 Family history of diabetes mellitus: Secondary | ICD-10-CM | POA: Diagnosis not present

## 2016-12-06 DIAGNOSIS — N179 Acute kidney failure, unspecified: Secondary | ICD-10-CM | POA: Diagnosis not present

## 2016-12-06 DIAGNOSIS — J8410 Pulmonary fibrosis, unspecified: Secondary | ICD-10-CM | POA: Diagnosis not present

## 2016-12-06 DIAGNOSIS — I129 Hypertensive chronic kidney disease with stage 1 through stage 4 chronic kidney disease, or unspecified chronic kidney disease: Secondary | ICD-10-CM | POA: Diagnosis not present

## 2016-12-06 DIAGNOSIS — Z885 Allergy status to narcotic agent status: Secondary | ICD-10-CM | POA: Diagnosis not present

## 2016-12-06 DIAGNOSIS — I350 Nonrheumatic aortic (valve) stenosis: Secondary | ICD-10-CM | POA: Diagnosis not present

## 2016-12-06 DIAGNOSIS — Z823 Family history of stroke: Secondary | ICD-10-CM | POA: Diagnosis not present

## 2016-12-06 DIAGNOSIS — T40601A Poisoning by unspecified narcotics, accidental (unintentional), initial encounter: Secondary | ICD-10-CM | POA: Diagnosis not present

## 2016-12-06 DIAGNOSIS — R402441 Other coma, without documented Glasgow coma scale score, or with partial score reported, in the field [EMT or ambulance]: Secondary | ICD-10-CM | POA: Diagnosis not present

## 2016-12-06 DIAGNOSIS — M7121 Synovial cyst of popliteal space [Baker], right knee: Secondary | ICD-10-CM | POA: Diagnosis not present

## 2016-12-06 DIAGNOSIS — Z79899 Other long term (current) drug therapy: Secondary | ICD-10-CM | POA: Diagnosis not present

## 2016-12-06 DIAGNOSIS — N189 Chronic kidney disease, unspecified: Secondary | ICD-10-CM | POA: Diagnosis not present

## 2016-12-06 DIAGNOSIS — R9082 White matter disease, unspecified: Secondary | ICD-10-CM | POA: Diagnosis not present

## 2016-12-06 DIAGNOSIS — K4020 Bilateral inguinal hernia, without obstruction or gangrene, not specified as recurrent: Secondary | ICD-10-CM | POA: Diagnosis not present

## 2016-12-06 DIAGNOSIS — N183 Chronic kidney disease, stage 3 (moderate): Secondary | ICD-10-CM | POA: Diagnosis not present

## 2016-12-06 DIAGNOSIS — Z888 Allergy status to other drugs, medicaments and biological substances status: Secondary | ICD-10-CM | POA: Diagnosis not present

## 2016-12-06 DIAGNOSIS — R918 Other nonspecific abnormal finding of lung field: Secondary | ICD-10-CM | POA: Diagnosis not present

## 2016-12-07 DIAGNOSIS — N179 Acute kidney failure, unspecified: Secondary | ICD-10-CM | POA: Diagnosis not present

## 2016-12-07 DIAGNOSIS — G894 Chronic pain syndrome: Secondary | ICD-10-CM | POA: Diagnosis not present

## 2016-12-07 DIAGNOSIS — N183 Chronic kidney disease, stage 3 (moderate): Secondary | ICD-10-CM | POA: Diagnosis not present

## 2016-12-07 DIAGNOSIS — I350 Nonrheumatic aortic (valve) stenosis: Secondary | ICD-10-CM | POA: Diagnosis not present

## 2016-12-07 DIAGNOSIS — G9340 Encephalopathy, unspecified: Secondary | ICD-10-CM | POA: Diagnosis not present

## 2016-12-08 DIAGNOSIS — N183 Chronic kidney disease, stage 3 (moderate): Secondary | ICD-10-CM | POA: Diagnosis not present

## 2016-12-08 DIAGNOSIS — I350 Nonrheumatic aortic (valve) stenosis: Secondary | ICD-10-CM | POA: Diagnosis not present

## 2016-12-08 DIAGNOSIS — G894 Chronic pain syndrome: Secondary | ICD-10-CM | POA: Diagnosis not present

## 2016-12-08 DIAGNOSIS — N179 Acute kidney failure, unspecified: Secondary | ICD-10-CM | POA: Diagnosis not present

## 2016-12-08 DIAGNOSIS — G9340 Encephalopathy, unspecified: Secondary | ICD-10-CM | POA: Diagnosis not present

## 2016-12-14 ENCOUNTER — Ambulatory Visit (INDEPENDENT_AMBULATORY_CARE_PROVIDER_SITE_OTHER): Payer: Medicare Other | Admitting: Physician Assistant

## 2016-12-14 ENCOUNTER — Encounter: Payer: Self-pay | Admitting: Physician Assistant

## 2016-12-14 VITALS — BP 120/95 | HR 93 | Temp 97.7°F | Wt 110.0 lb

## 2016-12-14 DIAGNOSIS — R300 Dysuria: Secondary | ICD-10-CM | POA: Diagnosis not present

## 2016-12-14 DIAGNOSIS — T40601D Poisoning by unspecified narcotics, accidental (unintentional), subsequent encounter: Secondary | ICD-10-CM | POA: Diagnosis not present

## 2016-12-14 LAB — POCT URINALYSIS DIPSTICK
GLUCOSE UA: NEGATIVE
Ketones, UA: NEGATIVE
Nitrite, UA: NEGATIVE
PH UA: 7.5 (ref 5.0–8.0)
Spec Grav, UA: 1.01 (ref 1.010–1.025)
Urobilinogen, UA: 0.2 E.U./dL

## 2016-12-14 NOTE — Progress Notes (Signed)
Subjective:    Patient ID: Roberto SchlatterRobert J Mi, male    DOB: 09/24/1921, 83 y.o.   MRN: [id number]  HPI Pt is a 83 yo male who presents to the clinic for hospital follow up on 12/06/16-12/08/16 for accidental overdose of oxycodone in addition to her blood pressure medication.   Pt has been on oxycodone for 20 years. Previous PCP in our office continued to prescribe this as well. Pt denies any suicidal thoughts or trying to harm himself. He became confused and took medication that was laid out for his wife in the middle of the night. His daughter in law has not taken over all control over narcotics and night time medication not giving them ability to take the wrong medication or too much.   Pt was catherized in hospital. daughter in law noticed cloudy urine and wanted to make sure not infection. Denies any dysuira, flank pain, low back pain. Pt continues to have no appetitie and losing weight.   .. Active Ambulatory Problems    Diagnosis Date Noted  . Hematuria 10/07/2011  . Inguinal hernia, bilateral 10/07/2011  . Unintentional weight loss 10/07/2011  . BPH (benign prostatic hyperplasia) 10/07/2011  . Bladder stone 10/07/2011  . Elevated PSA (6.1 05/2011), (6.5 07/2011) (7.6 10/13) (7.5 5/14) 10/11/2011  . Other and unspecified hyperlipidemia 10/12/2011  . Hyperlipidemia 10/17/2011  . Osteopenia (Femur T= -1.9 11/2011) 10/17/2011  . Chronic back pain 10/17/2011  . Rheumatoid arthritis (per outside records) 10/17/2011  . Glaucoma 10/17/2011  . CKD (chronic kidney disease) stage 3, GFR 30-59 ml/min (HCC) 10/17/2011  . 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 Diarrhea 02/10/2012  . Polypharmacy 02/21/2012  . Diverticulosis of colon without hemorrhage 03/05/2012  . Osteoarthritis of both knees 06/20/2012  . Osteoarthritis of right glenohumeral joint 06/27/2012  . Aortic regurgitation 11/15/2012  . Mitral valve stenosis 11/15/2012  . Pulmonary  fibrosis (HCC) 12/10/2012  . Tremor 05/07/2013  . Solitary pulmonary nodule 05/08/2013  . Glaucoma suspect 07/01/2013  . Atherosclerosis of aorta (HCC) 11/08/2013  . Essential tremor 02/18/2014  . Hyperglycemia 05/05/2014  . Allergic rhinitis 07/24/2014  . Abnormality of gait 12/03/2014  . Excess ear wax 02/24/2015  . Epigastric pain 08/06/2015  . Swelling of right lower extremity 10/12/2015  . H/O inguinal hernia repair 10/12/2015  . Encounter for chronic pain management 11/28/2015  . Generalized edema 02/25/2016  . Chronic pain syndrome 03/23/2016  . Drug induced constipation 11/30/2016   Resolved Ambulatory Problems    Diagnosis Date Noted  . Pulmonary nodule, left 10/17/2011  . UTI (lower urinary tract infection) 10/17/2011  . Pulmonary nodule 11/21/2012  . Vision loss 02/18/2014  . CAP (community acquired pneumonia) 02/12/2015  . Decubitus ulcer of left hip, stage 1 02/25/2016   Past Medical History:  Diagnosis Date  . Aortic stenosis   . Arthritis   . Bladder infection   . Bladder stone 10/07/2011  . BPH (benign prostatic hyperplasia) 10/07/2011  . GERD (gastroesophageal reflux disease)   . Hematuria 10/07/2011  . Hyperlipidemia   . Hypertension   . Migraines   . Nitrate poisoning 1945  . Pneumonia   . Stomach ulcer 1945  . Tremors of nervous system   . Unintentional weight loss 10/07/2011      Review of Systems See HPI.     Objective:   Physical Exam  Constitutional: He is oriented to person, place, and time. He appears well-developed and well-nourished.  In wheelchair.  HENT:  Head: Normocephalic and atraumatic.  Eyes: Conjunctivae are normal.  Neck: Normal range of motion. Neck supple.  Cardiovascular: Regular rhythm and normal heart sounds.  5/6 SEM.  Pulmonary/Chest: Effort normal and breath sounds normal.  No CVA tenderness.   Abdominal: Soft. Bowel sounds are normal. He exhibits no distension. There is no tenderness.  Neurological: He is alert  and oriented to person, place, and time.  Psychiatric: He has a normal mood and affect. His behavior is normal.          Assessment & Plan:   Marland Kitchen.Marland Kitchen.[name] was seen today for hospital f/u.  Diagnoses and all orders for this visit:  Overdose opiate, accidental or unintentional, subsequent encounter  Dysuria -     Cancel: Urinalysis Dipstick -     Urine Culture -     Urinalysis Dipstick     The recommendation from hospital is go off all opiates. Pt has been on for 20 years and certainly don't think coming off of them suddenly at 83 yo is a great idea. Pt does not appear to be suicidal. He became confused and had access to oxycodone. Discussed with daughter in law if this happens again ALL pain medications will be pulled. For now the daughter in law will be regulating all medications except for gas X/tums. We are also going to pull the 4th dose of oxycodone. Follow up in 2 months. Will get a UDS at next visit.   Urine dipstick not conclusive for infection will send for culture and treat accordingly. Vitals reassuring. Pt is asymptomatic today. Continue to stay hydrated.   Marland Kitchen..Spent 30 minutes with patient and greater than 50 percent of visit spent counseling patient regarding treatment plan.

## 2016-12-15 LAB — URINE CULTURE
MICRO NUMBER:: 81397352
SPECIMEN QUALITY:: ADEQUATE

## 2016-12-18 ENCOUNTER — Other Ambulatory Visit: Payer: Self-pay | Admitting: Physician Assistant

## 2016-12-20 DIAGNOSIS — N138 Other obstructive and reflux uropathy: Secondary | ICD-10-CM | POA: Diagnosis not present

## 2016-12-20 DIAGNOSIS — N401 Enlarged prostate with lower urinary tract symptoms: Secondary | ICD-10-CM | POA: Diagnosis not present

## 2017-01-02 ENCOUNTER — Telehealth: Payer: Self-pay | Admitting: *Deleted

## 2017-01-02 NOTE — Telephone Encounter (Signed)
Pt's daughter-in-law left vm stating that you have mentioned hospice care for the pt before and she would like to talk to you more in depth about it.

## 2017-01-04 NOTE — Telephone Encounter (Signed)
Called and LM. Discussed we could get set up for home health referral to assess needs of patient.

## 2017-01-10 NOTE — Addendum Note (Signed)
Addended by: Jomarie LongsBREEBACK, Marvelle Span L on: 01/10/2017 05:01 PM   Modules accepted: Orders

## 2017-01-10 NOTE — Telephone Encounter (Signed)
Pt's daughter in law called back & would like to go ahead with hospice evaluation.

## 2017-01-11 ENCOUNTER — Other Ambulatory Visit: Payer: Self-pay | Admitting: Physician Assistant

## 2017-01-13 ENCOUNTER — Other Ambulatory Visit: Payer: Self-pay | Admitting: Physician Assistant

## 2017-01-13 DIAGNOSIS — R5381 Other malaise: Secondary | ICD-10-CM

## 2017-01-13 DIAGNOSIS — I342 Nonrheumatic mitral (valve) stenosis: Secondary | ICD-10-CM

## 2017-01-13 DIAGNOSIS — J8410 Pulmonary fibrosis, unspecified: Secondary | ICD-10-CM

## 2017-01-13 DIAGNOSIS — R531 Weakness: Secondary | ICD-10-CM

## 2017-01-13 DIAGNOSIS — I351 Nonrheumatic aortic (valve) insufficiency: Secondary | ICD-10-CM

## 2017-01-13 DIAGNOSIS — R634 Abnormal weight loss: Secondary | ICD-10-CM

## 2017-01-13 NOTE — Progress Notes (Signed)
Placed referral for palliative care assessment.

## 2017-01-13 NOTE — Telephone Encounter (Signed)
Referral placed.

## 2017-01-13 NOTE — Addendum Note (Signed)
Addended by: Jomarie LongsBREEBACK, JADE L on: 01/13/2017 02:26 PM   Modules accepted: Orders

## 2017-01-14 ENCOUNTER — Other Ambulatory Visit: Payer: Self-pay | Admitting: Physician Assistant

## 2017-01-14 DIAGNOSIS — K5903 Drug induced constipation: Secondary | ICD-10-CM

## 2017-01-18 ENCOUNTER — Ambulatory Visit (INDEPENDENT_AMBULATORY_CARE_PROVIDER_SITE_OTHER): Payer: Medicare Other | Admitting: Physician Assistant

## 2017-01-18 ENCOUNTER — Encounter: Payer: Self-pay | Admitting: Physician Assistant

## 2017-01-18 VITALS — BP 140/72 | HR 81

## 2017-01-18 DIAGNOSIS — J209 Acute bronchitis, unspecified: Secondary | ICD-10-CM

## 2017-01-18 MED ORDER — DEXTROMETHORPHAN-GUAIFENESIN 5-100 MG/5ML PO LIQD: 10.0000 mL | Freq: Three times a day (TID) | ORAL | 0 refills | Status: DC | PRN

## 2017-01-18 MED ORDER — AZITHROMYCIN 250 MG PO TABS
ORAL_TABLET | ORAL | 0 refills | Status: DC
Start: 2017-01-18 — End: 2017-02-28

## 2017-01-18 NOTE — Patient Instructions (Signed)

## 2017-01-18 NOTE — Progress Notes (Signed)
Subjective:    Patient ID: Roberto Day, male    DOB: 1921-04-11, 84 y.o.   MRN: [id number]  HPI Pt is a 84 yo male with AS, AR, MV and chronic pain tht presents to the clinic with productive cough and fatigue. He is accompanied by daughter in law. He reports everyone in house has been sick and passing it around. Son and daughter in law seemed to be getting better but he feels like he is getting worse. He has a productive cough, lots of fatigue, no appetite. She gave him mucinex but seemed to make him cough more. No fever, chills, body aches. He feels "worn out". No ST, headache, sinus pressure, ear pain.   .. Active Ambulatory Problems    Diagnosis Date Noted  . Hematuria 10/07/2011  . Inguinal hernia, bilateral 10/07/2011  . Unintentional weight loss 10/07/2011  . BPH (benign prostatic hyperplasia) 10/07/2011  . Bladder stone 10/07/2011  . Elevated PSA (6.1 05/2011), (6.5 07/2011) (7.6 10/13) (7.5 5/14) 10/11/2011  . Other and unspecified hyperlipidemia 10/12/2011  . Hyperlipidemia 10/17/2011  . Osteopenia (Femur T= -1.9 11/2011) 10/17/2011  . Chronic back pain 10/17/2011  . Rheumatoid arthritis (per outside records) 10/17/2011  . Glaucoma 10/17/2011  . CKD (chronic kidney disease) stage 3, GFR 30-59 ml/min (HCC) 10/17/2011  . 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 Diarrhea 02/10/2012  . Polypharmacy 02/21/2012  . Diverticulosis of colon without hemorrhage 03/05/2012  . Osteoarthritis of both knees 06/20/2012  . Osteoarthritis of right glenohumeral joint 06/27/2012  . Aortic regurgitation 11/15/2012  . Mitral valve stenosis 11/15/2012  . Pulmonary fibrosis (HCC) 12/10/2012  . Tremor 05/07/2013  . Solitary pulmonary nodule 05/08/2013  . Glaucoma suspect 07/01/2013  . Atherosclerosis of aorta (HCC) 11/08/2013  . Essential tremor 02/18/2014  . Hyperglycemia 05/05/2014  . Allergic rhinitis 07/24/2014  . Abnormality of gait 12/03/2014   . Excess ear wax 02/24/2015  . Epigastric pain 08/06/2015  . Swelling of right lower extremity 10/12/2015  . H/O inguinal hernia repair 10/12/2015  . Encounter for chronic pain management 11/28/2015  . Generalized edema 02/25/2016  . Chronic pain syndrome 03/23/2016  . Drug induced constipation 11/30/2016   Resolved Ambulatory Problems    Diagnosis Date Noted  . Pulmonary nodule, left 10/17/2011  . UTI (lower urinary tract infection) 10/17/2011  . Pulmonary nodule 11/21/2012  . Vision loss 02/18/2014  . CAP (community acquired pneumonia) 02/12/2015  . Decubitus ulcer of left hip, stage 1 02/25/2016   Past Medical History:  Diagnosis Date  . Aortic stenosis   . Arthritis   . Bladder infection   . Bladder stone 10/07/2011  . BPH (benign prostatic hyperplasia) 10/07/2011  . GERD (gastroesophageal reflux disease)   . Hematuria 10/07/2011  . Hyperlipidemia   . Hypertension   . Migraines   . Nitrate poisoning 1945  . Pneumonia   . Stomach ulcer 1945  . Tremors of nervous system   . Unintentional weight loss 10/07/2011      Review of Systems See HPI.     Objective:   Physical Exam  Constitutional: He is oriented to person, place, and time. He appears well-developed and well-nourished.  In a wheelchair.   HENT:  Head: Normocephalic and atraumatic.  Right Ear: External ear normal.  Left Ear: External ear normal.  Nose: Nose normal.  Mouth/Throat: Oropharynx is clear and moist. No oropharyngeal exudate.  Eyes: Conjunctivae are normal. Right eye exhibits no discharge. Left eye exhibits no  discharge.  Neck: Normal range of motion. Neck supple.  Cardiovascular: Normal rate.  5/6 holosystolic continuous blowing murmur  Pulmonary/Chest:  Decreased effort.  Diffuse rhonchi throughout lungs.  No wheezing.   Lymphadenopathy:    He has no cervical adenopathy.  Neurological: He is alert and oriented to person, place, and time.  Psychiatric: He has a normal mood and affect.  His behavior is normal.          Assessment & Plan:  Marland Kitchen.Marland Kitchen.Diagnoses and all orders for this visit:  Acute bronchitis, unspecified organism -     azithromycin (ZITHROMAX) 250 MG tablet; 2 tablets now and then one table for 4 days. -     Dextromethorphan-Guaifenesin 5-100 MG/5ML LIQD; Take 10 mLs by mouth every 8 (eight) hours as needed. Max 60mg  a day.   Cough syrup given to use throughout day it does have cough suppressant and mucinex. Encouraged by good lung exam today. Pt is weak will treat for potential pneumonia with cough and rhonchi in lungs. Rest and hydrate. Follow up as needed.

## 2017-01-19 ENCOUNTER — Encounter: Payer: Self-pay | Admitting: Physician Assistant

## 2017-01-24 ENCOUNTER — Other Ambulatory Visit: Payer: Self-pay | Admitting: Physician Assistant

## 2017-01-24 DIAGNOSIS — M19011 Primary osteoarthritis, right shoulder: Secondary | ICD-10-CM

## 2017-01-24 DIAGNOSIS — M5134 Other intervertebral disc degeneration, thoracic region: Secondary | ICD-10-CM

## 2017-01-24 DIAGNOSIS — G894 Chronic pain syndrome: Secondary | ICD-10-CM

## 2017-01-24 DIAGNOSIS — M170 Bilateral primary osteoarthritis of knee: Secondary | ICD-10-CM

## 2017-01-24 MED ORDER — OXYCODONE-ACETAMINOPHEN 7.5-325 MG PO TABS: 1.0000 | ORAL_TABLET | Freq: Three times a day (TID) | ORAL | 0 refills | Status: DC | PRN

## 2017-01-24 NOTE — Progress Notes (Signed)
At last visit decided to drop night time dosing of pain medication and would only be managed by caregiver. Sent monthly refill.  controlled substance database reviewed and no concerns.

## 2017-01-25 DIAGNOSIS — M25561 Pain in right knee: Secondary | ICD-10-CM | POA: Diagnosis not present

## 2017-01-25 DIAGNOSIS — M25562 Pain in left knee: Secondary | ICD-10-CM | POA: Diagnosis not present

## 2017-01-25 DIAGNOSIS — M19012 Primary osteoarthritis, left shoulder: Secondary | ICD-10-CM | POA: Diagnosis not present

## 2017-01-25 DIAGNOSIS — M19011 Primary osteoarthritis, right shoulder: Secondary | ICD-10-CM | POA: Diagnosis not present

## 2017-02-02 ENCOUNTER — Telehealth: Payer: Self-pay

## 2017-02-02 DIAGNOSIS — E785 Hyperlipidemia, unspecified: Secondary | ICD-10-CM

## 2017-02-02 NOTE — Telephone Encounter (Signed)
Needs fasting labs

## 2017-02-03 DIAGNOSIS — E785 Hyperlipidemia, unspecified: Secondary | ICD-10-CM | POA: Diagnosis not present

## 2017-02-03 LAB — COMPLETE METABOLIC PANEL WITH GFR
AG Ratio: 1.7 (calc) (ref 1.0–2.5)
ALT: 29 U/L (ref 9–46)
AST: 24 U/L (ref 10–35)
Albumin: 3.4 g/dL — ABNORMAL LOW (ref 3.6–5.1)
Alkaline phosphatase (APISO): 110 U/L (ref 40–115)
BILIRUBIN TOTAL: 0.6 mg/dL (ref 0.2–1.2)
BUN/Creatinine Ratio: 23 (calc) — ABNORMAL HIGH (ref 6–22)
BUN: 32 mg/dL — AB (ref 7–25)
CHLORIDE: 104 mmol/L (ref 98–110)
CO2: 30 mmol/L (ref 20–32)
Calcium: 9.9 mg/dL (ref 8.6–10.3)
Creat: 1.42 mg/dL — ABNORMAL HIGH (ref 0.70–1.11)
GFR, EST AFRICAN AMERICAN: 49 mL/min/{1.73_m2} — AB (ref >=60)
GFR, Est Non African American: 42 mL/min/{1.73_m2} — ABNORMAL LOW (ref >=60)
Globulin: 2 g/dL (calc) (ref 1.9–3.7)
Glucose, Bld: 92 mg/dL (ref 65–99)
Potassium: 4.3 mmol/L (ref 3.5–5.3)
Sodium: 141 mmol/L (ref 135–146)
TOTAL PROTEIN: 5.4 g/dL — AB (ref 6.1–8.1)

## 2017-02-03 LAB — LIPID PANEL W/REFLEX DIRECT LDL
Cholesterol: 127 mg/dL (ref <200)
HDL: 59 mg/dL (ref >40)
LDL Cholesterol (Calc): 48 mg/dL (calc)
NON-HDL CHOLESTEROL (CALC): 68 mg/dL (ref <130)
TRIGLYCERIDES: 123 mg/dL (ref <150)
Total CHOL/HDL Ratio: 2.2 (calc) (ref <5.0)

## 2017-02-06 ENCOUNTER — Telehealth: Payer: Self-pay | Admitting: *Deleted

## 2017-02-06 NOTE — Telephone Encounter (Signed)
Spoke with daughter-in-law regarding pt's labs.  After I'd closed the result note, she wanted to know if you wanted to keep him on the Spironolactone qd (along with Lasix 80mg  qd) or switch to prn.  Please advise.

## 2017-02-06 NOTE — Telephone Encounter (Signed)
Call pt: cholesterol is great.  Chronic kidney disease is better than last 2 checks.

## 2017-02-07 ENCOUNTER — Other Ambulatory Visit: Payer: Self-pay | Admitting: *Deleted

## 2017-02-07 MED ORDER — ATORVASTATIN CALCIUM 10 MG PO TABS: ORAL_TABLET | ORAL | 3 refills | Status: AC

## 2017-02-07 NOTE — Telephone Encounter (Signed)
Stay on spironolactone and lasix for know. This was at the request of cardiologist. If BP is dropping we can reconsider.

## 2017-02-09 NOTE — Telephone Encounter (Signed)
Pt's daughter-in-law notified to keep pt on both medications.

## 2017-02-14 ENCOUNTER — Other Ambulatory Visit: Payer: Self-pay | Admitting: Physician Assistant

## 2017-02-24 ENCOUNTER — Telehealth: Payer: Self-pay | Admitting: Physician Assistant

## 2017-02-24 DIAGNOSIS — M170 Bilateral primary osteoarthritis of knee: Secondary | ICD-10-CM

## 2017-02-24 DIAGNOSIS — M19011 Primary osteoarthritis, right shoulder: Secondary | ICD-10-CM

## 2017-02-24 DIAGNOSIS — G894 Chronic pain syndrome: Secondary | ICD-10-CM

## 2017-02-24 DIAGNOSIS — M5134 Other intervertebral disc degeneration, thoracic region: Secondary | ICD-10-CM

## 2017-02-24 MED ORDER — OXYCODONE-ACETAMINOPHEN 7.5-325 MG PO TABS: 1.0000 | ORAL_TABLET | Freq: Three times a day (TID) | ORAL | 0 refills | Status: DC | PRN

## 2017-02-24 NOTE — Telephone Encounter (Signed)
Refilled pain medication

## 2017-02-26 ENCOUNTER — Other Ambulatory Visit: Payer: Self-pay | Admitting: Physician Assistant

## 2017-02-26 DIAGNOSIS — R601 Generalized edema: Secondary | ICD-10-CM

## 2017-02-28 ENCOUNTER — Ambulatory Visit (INDEPENDENT_AMBULATORY_CARE_PROVIDER_SITE_OTHER): Payer: Medicare Other | Admitting: Physician Assistant

## 2017-02-28 ENCOUNTER — Encounter: Payer: Self-pay | Admitting: Physician Assistant

## 2017-02-28 VITALS — BP 110/74 | HR 90 | Ht 68.0 in | Wt 95.0 lb

## 2017-02-28 DIAGNOSIS — G894 Chronic pain syndrome: Secondary | ICD-10-CM

## 2017-02-28 DIAGNOSIS — M19011 Primary osteoarthritis, right shoulder: Secondary | ICD-10-CM

## 2017-02-28 DIAGNOSIS — R627 Adult failure to thrive: Secondary | ICD-10-CM

## 2017-02-28 DIAGNOSIS — M170 Bilateral primary osteoarthritis of knee: Secondary | ICD-10-CM | POA: Diagnosis not present

## 2017-02-28 DIAGNOSIS — M5134 Other intervertebral disc degeneration, thoracic region: Secondary | ICD-10-CM | POA: Diagnosis not present

## 2017-02-28 MED ORDER — OXYCODONE-ACETAMINOPHEN 7.5-325 MG PO TABS: 1.0000 | ORAL_TABLET | Freq: Three times a day (TID) | ORAL | 0 refills | Status: DC | PRN

## 2017-02-28 NOTE — Progress Notes (Addendum)
Subjective:    Patient ID: Roberto SchlatterRobert J Day, male    DOB: 12/09/1921, 84 y.o.   MRN: [id number]  HPI  Pt is a 84 yo male with HTN, aortic regurgitation, mitral valve stenosis, aortic stenosis and chronic pai  Pt is in chronic pain but controlled with percocet. No problems or concerns.   Pt continues to lose weight. He has an appetite.  Pt reports he is eating. He doesn't eat a lot but eating at least 3 times a day.   .. Active Ambulatory Problems    Diagnosis Date Noted  . Hematuria 10/07/2011  . Inguinal hernia, bilateral 10/07/2011  . Unintentional weight loss 10/07/2011  . BPH (benign prostatic hyperplasia) 10/07/2011  . Bladder stone 10/07/2011  . Elevated PSA (6.1 05/2011), (6.5 07/2011) (7.6 10/13) (7.5 5/14) 10/11/2011  . Other and unspecified hyperlipidemia 10/12/2011  . Hyperlipidemia 10/17/2011  . Osteopenia (Femur T= -1.9 11/2011) 10/17/2011  . Chronic back pain 10/17/2011  . Rheumatoid arthritis (per outside records) 10/17/2011  . Glaucoma 10/17/2011  . CKD (chronic kidney disease) stage 3, GFR 30-59 ml/min (HCC) 10/17/2011  . 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 Diarrhea 02/10/2012  . Polypharmacy 02/21/2012  . Diverticulosis of colon without hemorrhage 03/05/2012  . Osteoarthritis of both knees 06/20/2012  . Osteoarthritis of right glenohumeral joint 06/27/2012  . Aortic regurgitation 11/15/2012  . Mitral valve stenosis 11/15/2012  . Pulmonary fibrosis (HCC) 12/10/2012  . Tremor 05/07/2013  . Solitary pulmonary nodule 05/08/2013  . Glaucoma suspect 07/01/2013  . Atherosclerosis of aorta (HCC) 11/08/2013  . Essential tremor 02/18/2014  . Hyperglycemia 05/05/2014  . Allergic rhinitis 07/24/2014  . Abnormality of gait 12/03/2014  . Excess ear wax 02/24/2015  . Epigastric pain 08/06/2015  . Swelling of right lower extremity 10/12/2015  . H/O inguinal hernia repair 10/12/2015  . Encounter for chronic pain  management 11/28/2015  . Generalized edema 02/25/2016  . Chronic pain syndrome 03/23/2016  . Drug induced constipation 11/30/2016   Resolved Ambulatory Problems    Diagnosis Date Noted  . Pulmonary nodule, left 10/17/2011  . UTI (lower urinary tract infection) 10/17/2011  . Pulmonary nodule 11/21/2012  . Vision loss 02/18/2014  . CAP (community acquired pneumonia) 02/12/2015  . Decubitus ulcer of left hip, stage 1 02/25/2016   Past Medical History:  Diagnosis Date  . Aortic stenosis   . Arthritis   . Bladder infection   . Bladder stone 10/07/2011  . BPH (benign prostatic hyperplasia) 10/07/2011  . GERD (gastroesophageal reflux disease)   . Hematuria 10/07/2011  . Hyperlipidemia   . Hypertension   . Migraines   . Nitrate poisoning 1945  . Pneumonia   . Stomach ulcer 1945  . Tremors of nervous system   . Unintentional weight loss 10/07/2011      Review of Systems  All other systems reviewed and are negative.      Objective:   Physical Exam  Constitutional: He is oriented to person, place, and time. He appears well-developed and well-nourished.  HENT:  Head: Normocephalic and atraumatic.  Neck: Normal range of motion. Neck supple.  Cardiovascular: Normal rate and regular rhythm.  5/6 SEM, holosystoic.  Pulmonary/Chest: Effort normal and breath sounds normal. He has no wheezes.  Neurological: He is alert and oriented to person, place, and time.  Psychiatric: He has a normal mood and affect. His behavior is normal.          Assessment & Plan:  Marland Kitchen.Marland Kitchen.[name] was seen  today for follow-up.  Diagnoses and all orders for this visit:  Osteoarthritis of right glenohumeral joint -     oxyCODONE-acetaminophen (PERCOCET) 7.5-325 MG tablet; Take 1 tablet by mouth every 8 (eight) hours as needed for moderate pain or severe pain.  Primary osteoarthritis of both knees -     oxyCODONE-acetaminophen (PERCOCET) 7.5-325 MG tablet; Take 1 tablet by mouth every 8 (eight) hours as  needed for moderate pain or severe pain.  Degenerative disc disease, thoracic T9/T10 (Consider MRI r/o Diskitis) -     oxyCODONE-acetaminophen (PERCOCET) 7.5-325 MG tablet; Take 1 tablet by mouth every 8 (eight) hours as needed for moderate pain or severe pain.  Chronic pain syndrome -     oxyCODONE-acetaminophen (PERCOCET) 7.5-325 MG tablet; Take 1 tablet by mouth every 8 (eight) hours as needed for moderate pain or severe pain.   Pt has this point is failure to thrive. BMI is underweight. Concerned about his weight loss. Discussed nutrition. Supplement with boost shakes. Will monitor. Follow up in 4 weeks for weight check. Down 15lbs since 12/2016.  [place] controlled substance database reviewed with no concerns.  Up to date pain contract and UDS.  There was some concern that he was taking too much but now daughter in law is controlling pain rx.  Certainly he is on a significant amt of pain rx but he has been on for years and not going to taper at this time.

## 2017-03-01 ENCOUNTER — Ambulatory Visit: Payer: Medicare Other | Admitting: Physician Assistant

## 2017-03-08 ENCOUNTER — Encounter: Payer: Self-pay | Admitting: Cardiology

## 2017-03-08 ENCOUNTER — Other Ambulatory Visit: Payer: Self-pay | Admitting: Physician Assistant

## 2017-03-09 ENCOUNTER — Other Ambulatory Visit: Payer: Self-pay | Admitting: Physician Assistant

## 2017-03-13 DIAGNOSIS — M19011 Primary osteoarthritis, right shoulder: Secondary | ICD-10-CM | POA: Diagnosis not present

## 2017-03-13 DIAGNOSIS — M170 Bilateral primary osteoarthritis of knee: Secondary | ICD-10-CM | POA: Diagnosis not present

## 2017-03-13 DIAGNOSIS — M19012 Primary osteoarthritis, left shoulder: Secondary | ICD-10-CM | POA: Diagnosis not present

## 2017-03-13 NOTE — Progress Notes (Signed)
HPI: FU aortic insufficiency.Last echocardiogram November 2017 showed normal LV function, grade 2 diastolic dysfunction, moderate aortic stenosis with mean gradient 32 mmHg, mild aortic insufficiency, moderate mitral stenosis, mild to moderate mitral regurgitation, moderate left atrial enlargement.At previous office visit we had discussions concerning aggressiveness of care including TAVR and pt wanted only medical therapy. He did not want to pursue fu echos. Since last seen,he does have mild dyspnea on exertion unchanged.  No increase in pedal edema.  No chest pain, palpitations or syncope.  Current Outpatient Medications  Medication Sig Dispense Refill  . AMBULATORY NON FORMULARY MEDICATION Collapsible light weight wheelchair.  Use daily as needed for mobility.  Dx: Osteoarthritis 1 Units 0  . atorvastatin (LIPITOR) 10 MG tablet TAKE 1 TABLET BY MOUTH ONCE DAILY. 90 tablet 3  . betaxolol (BETOPTIC-S) 0.25 % ophthalmic suspension Place 1 drop into both eyes 2 (two) times daily. 5 mL 3  . cetirizine (ZYRTEC) 10 MG tablet Take 1 tablet (10 mg total) by mouth daily. 30 tablet 11  . dicyclomine (BENTYL) 10 MG capsule TAKE 1 CAPSULE BY MOUTH 4 TIMES DAILY BEFORE MEAL(S) AND AT BEDTIME 90 capsule 2  . Docusate Sodium (COLACE PO) Take 1 capsule by mouth daily.     . furosemide (LASIX) 80 MG tablet Take 1 tablet (80 mg total) by mouth daily. MAY  TAKE  1  EXTRA  TABLET  BY  MOUTH  DAILY  AS  NEEDED  FOR  SWELLING 90 tablet 3  . hydrOXYzine (ATARAX/VISTARIL) 10 MG tablet TAKE 1 TABLET BY MOUTH THREE TIMES DAILY AS NEEDED 90 tablet 1  . linaclotide (LINZESS) 290 MCG CAPS capsule Take 1 capsule (290 mcg total) by mouth daily before breakfast. 30 capsule 5  . Multiple Vitamins-Minerals (CENTRUM SILVER PO) Take 1 tablet by mouth daily.     . Multiple Vitamins-Minerals (PRESERVISION AREDS PO) Take 1 capsule by mouth 2 (two) times daily.     Marland Kitchen. oxyCODONE-acetaminophen (PERCOCET) 7.5-325 MG tablet Take 1  tablet by mouth every 8 (eight) hours as needed for moderate pain or severe pain. 90 tablet 0  . Potassium Gluconate 550 MG TABS Take 1 tablet (550 mg total) by mouth daily. 90 each 1  . ranitidine (ZANTAC) 150 MG tablet TAKE ONE TABLET BY MOUTH TWICE DAILY 60 tablet 11  . Saw Palmetto, Serenoa repens, (SAW PALMETTO PO) Take 1 tablet by mouth daily.     Marland Kitchen. spironolactone (ALDACTONE) 25 MG tablet TAKE 1 TABLET BY MOUTH ONCE DAILY 90 tablet 2  . tamsulosin (FLOMAX) 0.4 MG CAPS capsule TAKE 2 CAPSULES BY MOUTH ONCE DAILY 180 capsule 0   No current facility-administered medications for this visit.      Past Medical History:  Diagnosis Date  . Aortic stenosis    and aortic insufficiency  . Arthritis    "bilateral knees and right shoulder"  . Bladder infection    hx of  . Bladder stone 10/07/2011  . BPH (benign prostatic hyperplasia) 10/07/2011  . GERD (gastroesophageal reflux disease)    only take medication as needed  . Hematuria 10/07/2011  . Hyperlipidemia   . Hypertension    Dr. Laren BoomSean Hommel, [facility]  . Migraines    "went away inthe early 1940's" (12/05/2011)  . Nitrate poisoning 1945  . Pneumonia    "had it very frequently when I was a child" (12/05/2011)  . Stomach ulcer 1945   "caused by nitrate poisoning" (12/05/2011)  . Tremors of  nervous system   . Unintentional weight loss 10/07/2011    Past Surgical History:  Procedure Laterality Date  . CIRCUMCISION     1996  . INGUINAL HERNIA REPAIR  12/05/2011   bilaterally  . INGUINAL HERNIA REPAIR  12/05/2011   Procedure: HERNIA REPAIR INGUINAL ADULT BILATERAL;  Surgeon: [name]. [name], MD;  Location: [facility] OR;  Service: General;  Laterality: Bilateral;  . INNER EAR SURGERY  1948   "left; perforated eardrum now" (12/05/2011)  . INSERTION OF MESH  12/05/2011   Procedure: INSERTION OF MESH;  Surgeon: [name]. [name], MD;  Location: [facility] OR;  Service: General;  Laterality: Bilateral;  . TONSILLECTOMY  1946    Social  History   Socioeconomic History  . Marital status: Married    Spouse name: Not on file  . Number of children: 3  . Years of education: HS  . Highest education level: Not on file  Social Needs  . Financial resource strain: Not on file  . Food insecurity - worry: Not on file  . Food insecurity - inability: Not on file  . Transportation needs - medical: Not on file  . Transportation needs - non-medical: Not on file  Occupational History  . Occupation: Retired  Tobacco Use  . Smoking status: Former Smoker    Packs/day: 0.50    Years: 5.00    Pack years: 2.50    Types: Cigarettes  . Smokeless tobacco: Never Used  . Tobacco comment: 12/05/2011 "haven't smoked since age [age]"  Substance and Sexual Activity  . Alcohol use: No    Alcohol/week: 0.0 oz  . Drug use: No  . Sexual activity: No  Other Topics Concern  . Not on file  Social History Narrative   Lives at home with his son.   Right-handed.   1 cup caffeine per daily.    Family History  Problem Relation Age of Onset  . Heart attack Mother   . Stroke Mother   . Cancer Mother        cervial and ovarian  . Diabetes Father   . Heart Problems Brother        ruptured heart    ROS: no fevers or chills, productive cough, hemoptysis, dysphasia, odynophagia, melena, hematochezia, dysuria, hematuria, rash, seizure activity, orthopnea, PND, pedal edema, claudication. Remaining systems are negative.  Physical Exam: Well-developed frail in no acute distress.  Skin is warm and dry.  HEENT is normal.  Neck is supple.  Chest is clear to auscultation with normal expansion.  Cardiovascular exam is regular rate and rhythm. 3/6 systolic murmur, s2 diminished Abdominal exam nontender or distended. No masses palpated. Extremities show trace edema. neuro grossly intact   A/P  1 aortic stenosis/aortic insufficiency-as outlined in previous notes patient wants only conservative measures and does not want any procedures including  consideration of TAVR.  He understands that aortic stenosis is a progressive disease and will ultimately take his life.  We will continue with present dose of Lasix as his volume status is reasonable.  Take an additional 40 mg daily as needed for worsening dyspnea or lower extremity edema.  We discussed the importance of fluid restriction and low-sodium diet.  2 mitral stenosis/mitral insufficiency-plan as outlined under #1.  3 hyperlipidemia-continue statin.  Olga Millers, MD

## 2017-03-14 ENCOUNTER — Telehealth: Payer: Self-pay | Admitting: *Deleted

## 2017-03-14 ENCOUNTER — Encounter: Payer: Self-pay | Admitting: Physician Assistant

## 2017-03-14 DIAGNOSIS — R627 Adult failure to thrive: Secondary | ICD-10-CM | POA: Insufficient documentation

## 2017-03-14 DIAGNOSIS — N183 Chronic kidney disease, stage 3 unspecified: Secondary | ICD-10-CM

## 2017-03-14 DIAGNOSIS — R6251 Failure to thrive (child): Secondary | ICD-10-CM

## 2017-03-14 DIAGNOSIS — J8410 Pulmonary fibrosis, unspecified: Secondary | ICD-10-CM

## 2017-03-14 DIAGNOSIS — I700 Atherosclerosis of aorta: Secondary | ICD-10-CM

## 2017-03-14 NOTE — Telephone Encounter (Signed)
Sent order to [facility].

## 2017-03-14 NOTE — Telephone Encounter (Signed)
Roberto MortimerWanda left a vm for you.  She said that you guys had talked about hospice care and the person that you need to talk to is Roberto BoehringerDandy from [facility] at [phone].

## 2017-03-14 NOTE — Telephone Encounter (Signed)
Can we call Vilinda BoehringerDandy and see if patient qualifies for hospice. I am ok with referral but I didn't know if he would qualify.

## 2017-03-15 ENCOUNTER — Ambulatory Visit: Payer: Medicare Other | Admitting: Cardiology

## 2017-03-15 ENCOUNTER — Encounter: Payer: Self-pay | Admitting: Cardiology

## 2017-03-15 VITALS — BP 127/74 | HR 82 | Ht 68.0 in | Wt 111.4 lb

## 2017-03-15 DIAGNOSIS — I342 Nonrheumatic mitral (valve) stenosis: Secondary | ICD-10-CM

## 2017-03-15 DIAGNOSIS — I359 Nonrheumatic aortic valve disorder, unspecified: Secondary | ICD-10-CM | POA: Diagnosis not present

## 2017-03-15 DIAGNOSIS — E7800 Pure hypercholesterolemia, unspecified: Secondary | ICD-10-CM

## 2017-03-15 NOTE — Patient Instructions (Signed)
Medication Instructions:  Your physician recommends that you continue on your current medications as directed. Please refer to the Current Medication list given to you today.   Labwork: -None  Testing/Procedures: -None  Follow-Up: Your physician wants you to follow-up in: 6 months with Dr.Crenshaw.  You will receive a reminder letter in the mail two months in advance. If you don't receive a letter, please call our office to schedule the follow-up appointment.   Any Other Special Instructions Will Be Listed Below (If Applicable).     If you need a refill on your cardiac medications before your next appointment, please call your pharmacy.   

## 2017-03-20 ENCOUNTER — Ambulatory Visit: Payer: Medicare Other | Admitting: Physician Assistant

## 2017-03-24 ENCOUNTER — Other Ambulatory Visit: Payer: Self-pay | Admitting: Physician Assistant

## 2017-03-24 DIAGNOSIS — K5903 Drug induced constipation: Secondary | ICD-10-CM

## 2017-04-06 ENCOUNTER — Ambulatory Visit (INDEPENDENT_AMBULATORY_CARE_PROVIDER_SITE_OTHER): Payer: Medicare Other

## 2017-04-06 ENCOUNTER — Encounter: Payer: Self-pay | Admitting: Physician Assistant

## 2017-04-06 ENCOUNTER — Ambulatory Visit (INDEPENDENT_AMBULATORY_CARE_PROVIDER_SITE_OTHER): Payer: Medicare Other | Admitting: Physician Assistant

## 2017-04-06 VITALS — BP 140/86 | HR 85 | Temp 97.5°F | Wt 101.0 lb

## 2017-04-06 DIAGNOSIS — K4021 Bilateral inguinal hernia, without obstruction or gangrene, recurrent: Secondary | ICD-10-CM | POA: Diagnosis not present

## 2017-04-06 DIAGNOSIS — M7541 Impingement syndrome of right shoulder: Secondary | ICD-10-CM

## 2017-04-06 DIAGNOSIS — R634 Abnormal weight loss: Secondary | ICD-10-CM

## 2017-04-06 DIAGNOSIS — M25511 Pain in right shoulder: Secondary | ICD-10-CM | POA: Diagnosis not present

## 2017-04-06 DIAGNOSIS — R1084 Generalized abdominal pain: Secondary | ICD-10-CM | POA: Diagnosis not present

## 2017-04-06 DIAGNOSIS — G8929 Other chronic pain: Secondary | ICD-10-CM

## 2017-04-06 DIAGNOSIS — R627 Adult failure to thrive: Secondary | ICD-10-CM | POA: Diagnosis not present

## 2017-04-06 DIAGNOSIS — K5909 Other constipation: Secondary | ICD-10-CM

## 2017-04-06 MED ORDER — MEGESTROL ACETATE 400 MG/10ML PO SUSP: 400.0000 mg | Freq: Every day | ORAL | 2 refills | Status: AC

## 2017-04-06 NOTE — Progress Notes (Signed)
Call pt: no fracture. Chronic rotator cuff disease and impingement. Certainly reasons for pain but he is on pain medicine and getting injections. Consider lots of heat.

## 2017-04-06 NOTE — Progress Notes (Signed)
Subjective:    Patient ID: Roberto SchlatterRobert J Day, male    DOB: 03/05/1921, 84 y.o.   MRN: [id number]  HPI   Roberto MaduroRobert presents today for a follow up regarding his weight. He weighed 95 lbs on 2/26, 111 on 3/13, and 101 lbs today. He has been eating but over the past week or so he has missed a couple meals due to pain. He has not taken the bentyl over the past week because it was causing him some extra pain and he was not eating. He is having a bowel movement about once every other day. He states BM's "are soft and hard".    Today, he also is complaining of some worsening right shoulder pain. He has been having issues with the right shoulder for the past ~8 months and has been following up with [facility]. His last injection was about 3 weeks ago. He recently lost his balance and fell into a wall on right shoulder. He is concerned.   Pallative care comes to the house twice a week and social work will start to come once a week.   .. Active Ambulatory Problems    Diagnosis Date Noted  . Hematuria 10/07/2011  . Inguinal hernia, bilateral 10/07/2011  . Unintentional weight loss 10/07/2011  . BPH (benign prostatic hyperplasia) 10/07/2011  . Bladder stone 10/07/2011  . Elevated PSA (6.1 05/2011), (6.5 07/2011) (7.6 10/13) (7.5 5/14) 10/11/2011  . Other and unspecified hyperlipidemia 10/12/2011  . Hyperlipidemia 10/17/2011  . Osteopenia (Femur T= -1.9 11/2011) 10/17/2011  . Chronic back pain 10/17/2011  . Rheumatoid arthritis (per outside records) 10/17/2011  . Glaucoma 10/17/2011  . CKD (chronic kidney disease) stage 3, GFR 30-59 ml/min (HCC) 10/17/2011  . 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 Diarrhea 02/10/2012  . Polypharmacy 02/21/2012  . Diverticulosis of colon without hemorrhage 03/05/2012  . Osteoarthritis of both knees 06/20/2012  . Osteoarthritis of right glenohumeral joint 06/27/2012  . Aortic regurgitation 11/15/2012  . Mitral valve  stenosis 11/15/2012  . Pulmonary fibrosis (HCC) 12/10/2012  . Tremor 05/07/2013  . Solitary pulmonary nodule 05/08/2013  . Glaucoma suspect 07/01/2013  . Atherosclerosis of aorta (HCC) 11/08/2013  . Essential tremor 02/18/2014  . Hyperglycemia 05/05/2014  . Allergic rhinitis 07/24/2014  . Abnormality of gait 12/03/2014  . Excess ear wax 02/24/2015  . Epigastric pain 08/06/2015  . Swelling of right lower extremity 10/12/2015  . H/O inguinal hernia repair 10/12/2015  . Encounter for chronic pain management 11/28/2015  . Generalized edema 02/25/2016  . Chronic pain syndrome 03/23/2016  . Drug induced constipation 11/30/2016  . Failure to thrive in adult 03/14/2017  . Chronic right shoulder pain 04/06/2017   Resolved Ambulatory Problems    Diagnosis Date Noted  . Pulmonary nodule, left 10/17/2011  . UTI (lower urinary tract infection) 10/17/2011  . Pulmonary nodule 11/21/2012  . Vision loss 02/18/2014  . CAP (community acquired pneumonia) 02/12/2015  . Decubitus ulcer of left hip, stage 1 02/25/2016   Past Medical History:  Diagnosis Date  . Aortic stenosis   . Arthritis   . Bladder infection   . Bladder stone 10/07/2011  . BPH (benign prostatic hyperplasia) 10/07/2011  . GERD (gastroesophageal reflux disease)   . Hematuria 10/07/2011  . Hyperlipidemia   . Hypertension   . Migraines   . Nitrate poisoning 1945  . Pneumonia   . Stomach ulcer 1945  . Tremors of nervous system   . Unintentional weight loss 10/07/2011  Review of Systems  Constitutional: Positive for appetite change. Negative for activity change, chills and fever.  Respiratory: Negative for cough and shortness of breath.   Cardiovascular: Negative for chest pain and leg swelling.  Gastrointestinal: Negative for constipation and vomiting.       Objective:   Physical Exam  Constitutional: He is oriented to person, place, and time.  Alert and oriented male, who is frail and cachetic but in NAD  HENT:   Head: Normocephalic and atraumatic.  Eyes: Conjunctivae are normal.  Cardiovascular: Normal rate and regular rhythm.  Murmur ( 5/6 SEM; holosystolic) heard. Pulmonary/Chest: Effort normal and breath sounds normal. No respiratory distress. He has no wheezes.  Abdominal: Soft. He exhibits mass. There is tenderness ( RLQ). There is no guarding.  Inguinal hernia.  Musculoskeletal:  Decreased and painful ROM with any movement of the right upper extremity with tenderness to palpation to the anterior aspect of the shoulder and cricoid process  Neurological: He is alert and oriented to person, place, and time.  Skin: Skin is warm and dry.  Psychiatric: He has a normal mood and affect. His behavior is normal.          Assessment & Plan:  Marland KitchenMarland KitchenDiagnoses and all orders for this visit:  Adult failure to thrive -     megestrol (MEGACE) 400 MG/10ML suspension; Take 10 mLs (400 mg total) by mouth daily.  Unintentional weight loss -     megestrol (MEGACE) 400 MG/10ML suspension; Take 10 mLs (400 mg total) by mouth daily.  Chronic right shoulder pain -     DG Shoulder Right  Generalized abdominal pain  Bilateral recurrent inguinal hernia without obstruction or gangrene  Chronic constipation  Abnormal weight loss -     CT Abdomen Pelvis Wo Contrast   I am concerned with patient's weight loss.  He had done well the first month while trying to gain weight.  In the next month he dropped again.  He is still not to the 95 pounds he was when he started the initiative to gain weight.  When he does not eat he describes abdominal pain as being the reason why he does not do this.  We have often suspected that it would be his hernia causing the pain.  Now he describes more upper abdominal pain.  I discussed with caregiver and patient and they would like to proceed with a CT of the abdomen.  I went ahead and ordered this.  Discussed continuing pushing  liquids calories such as boost and Ensure.  I went  ahead and started Megace to see if it would increase his appetite.  Certainly if he continues to lose weight we may have to consider a feeding tube if family wants to proceed in this way.  The patient is concerned because he landed on his right shoulder recently that he could have fractured it.  We will order a x-ray today.  Discussed currently for his chronic pain he is on pain medicine and he is receiving injections from [facility] [place] at which is everything that we can do for this.  He can certainly use a lot of heat in the area and rub on creams to help with pain.

## 2017-04-11 ENCOUNTER — Ambulatory Visit: Payer: Medicare Other | Admitting: Physician Assistant

## 2017-04-18 ENCOUNTER — Ambulatory Visit (INDEPENDENT_AMBULATORY_CARE_PROVIDER_SITE_OTHER)

## 2017-04-18 ENCOUNTER — Ambulatory Visit (INDEPENDENT_AMBULATORY_CARE_PROVIDER_SITE_OTHER): Admitting: Physician Assistant

## 2017-04-18 ENCOUNTER — Encounter: Payer: Self-pay | Admitting: Physician Assistant

## 2017-04-18 ENCOUNTER — Ambulatory Visit (INDEPENDENT_AMBULATORY_CARE_PROVIDER_SITE_OTHER): Payer: Medicare Other

## 2017-04-18 VITALS — BP 114/63 | HR 95 | Temp 98.0°F | Ht 68.0 in

## 2017-04-18 DIAGNOSIS — K5730 Diverticulosis of large intestine without perforation or abscess without bleeding: Secondary | ICD-10-CM

## 2017-04-18 DIAGNOSIS — N400 Enlarged prostate without lower urinary tract symptoms: Secondary | ICD-10-CM | POA: Diagnosis not present

## 2017-04-18 DIAGNOSIS — M25521 Pain in right elbow: Secondary | ICD-10-CM

## 2017-04-18 DIAGNOSIS — I700 Atherosclerosis of aorta: Secondary | ICD-10-CM

## 2017-04-18 DIAGNOSIS — N210 Calculus in bladder: Secondary | ICD-10-CM

## 2017-04-18 DIAGNOSIS — W19XXXA Unspecified fall, initial encounter: Secondary | ICD-10-CM

## 2017-04-18 DIAGNOSIS — N200 Calculus of kidney: Secondary | ICD-10-CM | POA: Diagnosis not present

## 2017-04-18 DIAGNOSIS — S59901A Unspecified injury of right elbow, initial encounter: Secondary | ICD-10-CM | POA: Diagnosis not present

## 2017-04-18 DIAGNOSIS — K7460 Unspecified cirrhosis of liver: Secondary | ICD-10-CM

## 2017-04-18 NOTE — Patient Instructions (Signed)
Increase by once a day for next 48 hours.

## 2017-04-18 NOTE — Progress Notes (Signed)
Call pt: no fracture.

## 2017-04-18 NOTE — Progress Notes (Signed)
Subjective:    Patient ID: Roberto Day, male    DOB: [birth year], 84 y.o.   MRN: [id number]  HPI Roberto Day presents today after a fall last night. Daughter states that around 2200 he bent over to pick something up and fell over landing on his right elbow. No LOC. She has the area covered with duoderm. Bleeding is controlled. He is in a lot of pain today.   .. Active Ambulatory Problems    Diagnosis Date Noted  . Hematuria 10/07/2011  . Inguinal hernia, bilateral 10/07/2011  . Unintentional weight loss 10/07/2011  . BPH (benign prostatic hyperplasia) 10/07/2011  . Bladder stone 10/07/2011  . Elevated PSA (6.1 05/2011), (6.5 07/2011) (7.6 10/13) (7.5 5/14) 10/11/2011  . Other and unspecified hyperlipidemia 10/12/2011  . Hyperlipidemia 10/17/2011  . Osteopenia (Femur T= -1.9 11/2011) 10/17/2011  . Chronic back pain 10/17/2011  . Rheumatoid arthritis (per outside records) 10/17/2011  . Glaucoma 10/17/2011  . CKD (chronic kidney disease) stage 3, GFR 30-59 ml/min (HCC) 10/17/2011  . 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 Diarrhea 02/10/2012  . Polypharmacy 02/21/2012  . Diverticulosis of colon without hemorrhage 03/05/2012  . Osteoarthritis of both knees 06/20/2012  . Osteoarthritis of right glenohumeral joint 06/27/2012  . Aortic regurgitation 11/15/2012  . Mitral valve stenosis 11/15/2012  . Pulmonary fibrosis (HCC) 12/10/2012  . Tremor 05/07/2013  . Solitary pulmonary nodule 05/08/2013  . Glaucoma suspect 07/01/2013  . Atherosclerosis of aorta (HCC) 11/08/2013  . Essential tremor 02/18/2014  . Hyperglycemia 05/05/2014  . Allergic rhinitis 07/24/2014  . Abnormality of gait 12/03/2014  . Excess ear wax 02/24/2015  . Epigastric pain 08/06/2015  . Swelling of right lower extremity 10/12/2015  . H/O inguinal hernia repair 10/12/2015  . Encounter for chronic pain management 11/28/2015  . Generalized edema 02/25/2016  . Chronic pain  syndrome 03/23/2016  . Drug induced constipation 11/30/2016  . Failure to thrive in adult 03/14/2017  . Chronic right shoulder pain 04/06/2017  . Generalized abdominal pain 04/06/2017  . Chronic constipation 04/06/2017   Resolved Ambulatory Problems    Diagnosis Date Noted  . Pulmonary nodule, left 10/17/2011  . UTI (lower urinary tract infection) 10/17/2011  . Pulmonary nodule 11/21/2012  . Vision loss 02/18/2014  . CAP (community acquired pneumonia) 02/12/2015  . Decubitus ulcer of left hip, stage 1 02/25/2016   Past Medical History:  Diagnosis Date  . Aortic stenosis   . Arthritis   . Bladder infection   . Bladder stone 10/07/2011  . BPH (benign prostatic hyperplasia) 10/07/2011  . GERD (gastroesophageal reflux disease)   . Hematuria 10/07/2011  . Hyperlipidemia   . Hypertension   . Migraines   . Nitrate poisoning 1945  . Pneumonia   . Stomach ulcer 1945  . Tremors of nervous system   . Unintentional weight loss 10/07/2011      Review of Systems  Constitutional: Negative for activity change and unexpected weight change.  Respiratory: Negative for shortness of breath.   Cardiovascular: Negative for chest pain.  Gastrointestinal: Negative for diarrhea and vomiting.  Musculoskeletal: Positive for myalgias. Negative for neck pain.  Neurological: Negative for dizziness and syncope.       Objective:   Physical Exam  Constitutional: He is oriented to person, place, and time. He appears cachectic. He is cooperative.  HENT:  Head: Normocephalic and atraumatic.  Eyes: Conjunctivae are normal.  Neck: Normal range of motion.  Cardiovascular: Normal rate and regular rhythm.  Murmur (  5/6 SEM) heard. Pulmonary/Chest: Breath sounds normal. No respiratory distress. He has no wheezes.  Musculoskeletal:  Tenderness to the right elbow; ROM has not changed, baseline ROM is limited normally  Neurological: He is alert and oriented to person, place, and time.  Skin: Skin is warm  and dry.  Scattered ecchymosis, skin tear right elbow covered with duoderm  Psychiatric: He has a normal mood and affect. His behavior is normal.          Assessment & Plan:  Marland Kitchen.Marland Kitchen.Diagnoses and all orders for this visit:  Fall, initial encounter -     DG Elbow Complete Right   Ordered x-ray of right elbow today.  It was negative for any fracture.  Patient using a DuoDERM bandage where the skin tore after fall.  I would like for him to continue using these.  He does have a nurse coming out at least once a week to help him with wound care.  I did give him some extra DuoDERM in the office so that he can use this until her next follow-up.  He is already on Percocet 3 times a day for pain.  I did allow him in the short-term for the next 48 hours to increase to 4 times a day.  I will consult with supervising physician to see if we could increase for longer and what her suggestions were.  He is not able to tolerate NSAIDs due to chronic kidney disease.  He is even tried the topical NSAIDs with worsening of kidney function.  Encouraged ice over affected areas.  Reassured patient that as the days go on and healing occurs his pain should improve.

## 2017-04-19 ENCOUNTER — Encounter: Payer: Self-pay | Admitting: Physician Assistant

## 2017-04-19 DIAGNOSIS — K5790 Diverticulosis of intestine, part unspecified, without perforation or abscess without bleeding: Secondary | ICD-10-CM | POA: Insufficient documentation

## 2017-04-19 DIAGNOSIS — N320 Bladder-neck obstruction: Secondary | ICD-10-CM | POA: Insufficient documentation

## 2017-04-19 DIAGNOSIS — K7460 Unspecified cirrhosis of liver: Secondary | ICD-10-CM | POA: Insufficient documentation

## 2017-04-19 NOTE — Progress Notes (Signed)
Call pt: No definite explanation of unintentional weight loss.  pt's liver is consistent with cirrhosis.  Diverticulosis present.  It looks like there could be a mass that is obstructing the bladder. We would need to send to urology for further evaluation.

## 2017-04-20 ENCOUNTER — Other Ambulatory Visit: Payer: Self-pay

## 2017-04-20 DIAGNOSIS — G894 Chronic pain syndrome: Secondary | ICD-10-CM

## 2017-04-20 DIAGNOSIS — M5134 Other intervertebral disc degeneration, thoracic region: Secondary | ICD-10-CM

## 2017-04-20 DIAGNOSIS — M19011 Primary osteoarthritis, right shoulder: Secondary | ICD-10-CM

## 2017-04-20 DIAGNOSIS — M170 Bilateral primary osteoarthritis of knee: Secondary | ICD-10-CM

## 2017-04-20 MED ORDER — OXYCODONE-ACETAMINOPHEN 7.5-325 MG PO TABS: 1.0000 | ORAL_TABLET | Freq: Three times a day (TID) | ORAL | 0 refills | Status: AC | PRN

## 2017-04-20 NOTE — Telephone Encounter (Signed)
Roberto MortimerWanda request a refill on Oxycodone.

## 2017-04-24 ENCOUNTER — Other Ambulatory Visit: Payer: Self-pay | Admitting: Physician Assistant

## 2017-04-25 ENCOUNTER — Telehealth: Payer: Self-pay

## 2017-04-25 DIAGNOSIS — M19012 Primary osteoarthritis, left shoulder: Secondary | ICD-10-CM | POA: Diagnosis not present

## 2017-04-25 DIAGNOSIS — M1711 Unilateral primary osteoarthritis, right knee: Secondary | ICD-10-CM | POA: Diagnosis not present

## 2017-04-25 DIAGNOSIS — M19011 Primary osteoarthritis, right shoulder: Secondary | ICD-10-CM | POA: Diagnosis not present

## 2017-04-25 DIAGNOSIS — M1712 Unilateral primary osteoarthritis, left knee: Secondary | ICD-10-CM | POA: Diagnosis not present

## 2017-04-25 MED ORDER — HYDROXYZINE HCL 10 MG PO TABS: ORAL_TABLET | ORAL | 1 refills | Status: AC

## 2017-04-25 NOTE — Telephone Encounter (Signed)
[name] called and states Lesly RubensteinJade advised patient to take the prescription of hydroxyzine to 2 in the morning and 1 in the afternoon and 1 in the evening 08/23/16. Jade didn't change the prescription to reflect. She needs a refill early for this reason. Please advise.     Pt cannot tolerate codiene, he had a GI bleed with celebrex.  After viewing his chart will consider talking to him about gabapentin for pain.   Increase vistaril to 20mg  in am and 10mg  other times for tremor and anxiety. Follow up in 3 months.   Consider ace wrap to allow patient to feel more support especially after eating. Pt is not a surgical candidate.         Instructions      Return in about 3 months (around 11/23/2016).

## 2017-04-25 NOTE — Telephone Encounter (Signed)
Continue existing prescribing.  Follow up with PCP soon for risk of confusion with this medicine.

## 2017-04-26 NOTE — Telephone Encounter (Signed)
Left message advising of recommendations.  

## 2017-05-22 DIAGNOSIS — H02831 Dermatochalasis of right upper eyelid: Secondary | ICD-10-CM | POA: Diagnosis not present

## 2017-05-22 DIAGNOSIS — H353131 Nonexudative age-related macular degeneration, bilateral, early dry stage: Secondary | ICD-10-CM | POA: Diagnosis not present

## 2017-05-22 DIAGNOSIS — H02834 Dermatochalasis of left upper eyelid: Secondary | ICD-10-CM | POA: Diagnosis not present

## 2017-05-22 DIAGNOSIS — H401231 Low-tension glaucoma, bilateral, mild stage: Secondary | ICD-10-CM | POA: Diagnosis not present

## 2017-05-22 DIAGNOSIS — H5111 Convergence insufficiency: Secondary | ICD-10-CM | POA: Diagnosis not present

## 2017-05-26 ENCOUNTER — Other Ambulatory Visit: Payer: Self-pay | Admitting: Physician Assistant

## 2017-05-30 ENCOUNTER — Ambulatory Visit (INDEPENDENT_AMBULATORY_CARE_PROVIDER_SITE_OTHER): Payer: Medicare Other | Admitting: Physician Assistant

## 2017-05-30 ENCOUNTER — Encounter: Payer: Self-pay | Admitting: Physician Assistant

## 2017-05-30 VITALS — BP 107/69 | HR 83 | Ht 68.0 in | Wt 103.0 lb

## 2017-05-30 DIAGNOSIS — N183 Chronic kidney disease, stage 3 unspecified: Secondary | ICD-10-CM

## 2017-05-30 DIAGNOSIS — G894 Chronic pain syndrome: Secondary | ICD-10-CM | POA: Diagnosis not present

## 2017-05-30 DIAGNOSIS — I350 Nonrheumatic aortic (valve) stenosis: Secondary | ICD-10-CM

## 2017-05-30 DIAGNOSIS — M25472 Effusion, left ankle: Secondary | ICD-10-CM

## 2017-05-30 DIAGNOSIS — R627 Adult failure to thrive: Secondary | ICD-10-CM | POA: Diagnosis not present

## 2017-05-30 NOTE — Progress Notes (Signed)
Subjective:    Patient ID: Roberto Day, male    DOB: 01/31/1921, 84 y.o.   MRN: [id number]  HPI  Pt is a 84 yo male who presents to the clinic for 3 month follow up. Pt is under palliative care for failure to thrive. He is eating pretty well and his stomach hurts less after meals when he takes gas-X.   He weighed 95 last Thursday. Weighed 103 today. He did weigh with his shoes on today.   Continues to have left ankle swelling. He is taking lasix 1 and 1/2 tablets up to twice a day as directed by cardiologist. It has improved a lot. woresened after fall 6 weeks ago.   Daughter in law reports palliative care told them to start advil twice a day which they have been doing.    .. Active Ambulatory Problems    Diagnosis Date Noted  . Hematuria 10/07/2011  . Inguinal hernia, bilateral 10/07/2011  . Unintentional weight loss 10/07/2011  . BPH (benign prostatic hyperplasia) 10/07/2011  . Bladder stone 10/07/2011  . Elevated PSA (6.1 05/2011), (6.5 07/2011) (7.6 10/13) (7.5 5/14) 10/11/2011  . Other and unspecified hyperlipidemia 10/12/2011  . Hyperlipidemia 10/17/2011  . Osteopenia (Femur T= -1.9 11/2011) 10/17/2011  . Chronic back pain 10/17/2011  . Rheumatoid arthritis (per outside records) 10/17/2011  . Glaucoma 10/17/2011  . CKD (chronic kidney disease) stage 3, GFR 30-59 ml/min (HCC) 10/17/2011  . 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 Diarrhea 02/10/2012  . Polypharmacy 02/21/2012  . Diverticulosis of colon without hemorrhage 03/05/2012  . Osteoarthritis of both knees 06/20/2012  . Osteoarthritis of right glenohumeral joint 06/27/2012  . Aortic regurgitation 11/15/2012  . Mitral valve stenosis 11/15/2012  . Pulmonary fibrosis (HCC) 12/10/2012  . Tremor 05/07/2013  . Solitary pulmonary nodule 05/08/2013  . Glaucoma suspect 07/01/2013  . Atherosclerosis of aorta (HCC) 11/08/2013  . Essential tremor 02/18/2014  . Hyperglycemia  05/05/2014  . Allergic rhinitis 07/24/2014  . Abnormality of gait 12/03/2014  . Excess ear wax 02/24/2015  . Epigastric pain 08/06/2015  . Swelling of right lower extremity 10/12/2015  . H/O inguinal hernia repair 10/12/2015  . Encounter for chronic pain management 11/28/2015  . Generalized edema 02/25/2016  . Chronic pain syndrome 03/23/2016  . Drug induced constipation 11/30/2016  . Failure to thrive in adult 03/14/2017  . Chronic right shoulder pain 04/06/2017  . Generalized abdominal pain 04/06/2017  . Chronic constipation 04/06/2017  . Bladder outlet obstruction 04/19/2017  . Cirrhosis of liver (HCC) 04/19/2017  . Diverticulosis 04/19/2017  . Aortic stenosis 06/02/2017  . Left ankle swelling 06/02/2017   Resolved Ambulatory Problems    Diagnosis Date Noted  . Pulmonary nodule, left 10/17/2011  . UTI (lower urinary tract infection) 10/17/2011  . Pulmonary nodule 11/21/2012  . Vision loss 02/18/2014  . CAP (community acquired pneumonia) 02/12/2015  . Decubitus ulcer of left hip, stage 1 02/25/2016   Past Medical History:  Diagnosis Date  . Aortic stenosis   . Arthritis   . Bladder infection   . Bladder stone 10/07/2011  . BPH (benign prostatic hyperplasia) 10/07/2011  . GERD (gastroesophageal reflux disease)   . Hematuria 10/07/2011  . Hyperlipidemia   . Hypertension   . Migraines   . Nitrate poisoning 1945  . Pneumonia   . Stomach ulcer 1945  . Tremors of nervous system   . Unintentional weight loss 10/07/2011     Review of Systems  All other systems  reviewed and are negative.      Objective:   Physical Exam  Constitutional: He is oriented to person, place, and time. No distress.  HENT:  Head: Normocephalic and atraumatic.  Cardiovascular: Normal rate and regular rhythm.  Murmur heard. 5/6 SEM.   Pulmonary/Chest: Effort normal and breath sounds normal. He has no wheezes.  Neurological: He is alert and oriented to person, place, and time.  Skin: He is  not diaphoretic.  Psychiatric: He has a normal mood and affect. His behavior is normal.          Assessment & Plan:  Marland KitchenMarland KitchenDiagnoses and all orders for this visit:  Failure to thrive in adult  Chronic pain syndrome  CKD (chronic kidney disease) stage 3, GFR 30-59 ml/min (HCC) -     BASIC METABOLIC PANEL WITH GFR  Nonrheumatic aortic valve stenosis  Left ankle swelling   palliative care managed pain medication at this time. Pt appears to be gaining weight today.  The goal is to get weekly updates from palliative care with weight.  Left ankle swelling is improving.  Encourage compression/elevation and continuing the Lasix.  Discussed not using NSAIDs due to his chronic kidney disease.  I am concerned there could be some decrease in overall function.  We will check BMP today.

## 2017-05-31 LAB — BASIC METABOLIC PANEL WITH GFR
BUN / CREAT RATIO: 24 (calc) — AB (ref 6–22)
BUN: 48 mg/dL — ABNORMAL HIGH (ref 7–25)
CALCIUM: 9.1 mg/dL (ref 8.6–10.3)
CHLORIDE: 100 mmol/L (ref 98–110)
CO2: 31 mmol/L (ref 20–32)
Creat: 2.02 mg/dL — ABNORMAL HIGH (ref 0.70–1.11)
GFR, EST AFRICAN AMERICAN: 32 mL/min/{1.73_m2} — AB (ref >=60)
GFR, EST NON AFRICAN AMERICAN: 28 mL/min/{1.73_m2} — AB (ref >=60)
Glucose, Bld: 100 mg/dL — ABNORMAL HIGH (ref 65–99)
POTASSIUM: 4.5 mmol/L (ref 3.5–5.3)
Sodium: 138 mmol/L (ref 135–146)

## 2017-05-31 NOTE — Progress Notes (Signed)
Call pt's daughter in law: kidney function has bumped up quite a bit. NO NSAIDs not ibuprofen/aleve/goodys powder/advil. ONLY tylenol.   Can we contact palliative care and see if they can check a bmp in 1 week?

## 2017-06-02 ENCOUNTER — Encounter: Payer: Self-pay | Admitting: Physician Assistant

## 2017-06-02 DIAGNOSIS — I350 Nonrheumatic aortic (valve) stenosis: Secondary | ICD-10-CM | POA: Insufficient documentation

## 2017-06-02 DIAGNOSIS — M25472 Effusion, left ankle: Secondary | ICD-10-CM | POA: Insufficient documentation

## 2017-06-06 DIAGNOSIS — M170 Bilateral primary osteoarthritis of knee: Secondary | ICD-10-CM | POA: Diagnosis not present

## 2017-06-06 DIAGNOSIS — M19011 Primary osteoarthritis, right shoulder: Secondary | ICD-10-CM | POA: Diagnosis not present

## 2017-06-06 DIAGNOSIS — M19012 Primary osteoarthritis, left shoulder: Secondary | ICD-10-CM | POA: Diagnosis not present

## 2017-06-08 DIAGNOSIS — N400 Enlarged prostate without lower urinary tract symptoms: Secondary | ICD-10-CM | POA: Diagnosis not present

## 2017-07-18 DIAGNOSIS — M19011 Primary osteoarthritis, right shoulder: Secondary | ICD-10-CM | POA: Diagnosis not present

## 2017-07-18 DIAGNOSIS — M25562 Pain in left knee: Secondary | ICD-10-CM | POA: Diagnosis not present

## 2017-07-18 DIAGNOSIS — M25561 Pain in right knee: Secondary | ICD-10-CM | POA: Diagnosis not present

## 2017-07-18 DIAGNOSIS — M1711 Unilateral primary osteoarthritis, right knee: Secondary | ICD-10-CM | POA: Diagnosis not present

## 2017-07-18 DIAGNOSIS — M1712 Unilateral primary osteoarthritis, left knee: Secondary | ICD-10-CM | POA: Diagnosis not present

## 2017-08-07 ENCOUNTER — Other Ambulatory Visit: Payer: Self-pay | Admitting: *Deleted

## 2017-08-07 DIAGNOSIS — K5903 Drug induced constipation: Secondary | ICD-10-CM

## 2017-08-07 MED ORDER — LINACLOTIDE 290 MCG PO CAPS: 290.0000 ug | ORAL_CAPSULE | Freq: Every day | ORAL | 5 refills | Status: AC

## 2017-08-18 ENCOUNTER — Other Ambulatory Visit: Payer: Self-pay | Admitting: Cardiology

## 2017-08-18 DIAGNOSIS — N183 Chronic kidney disease, stage 3 unspecified: Secondary | ICD-10-CM

## 2017-08-29 ENCOUNTER — Encounter: Payer: Self-pay | Admitting: Physician Assistant

## 2017-08-29 ENCOUNTER — Ambulatory Visit (INDEPENDENT_AMBULATORY_CARE_PROVIDER_SITE_OTHER): Admitting: Physician Assistant

## 2017-08-29 VITALS — BP 125/82 | HR 85 | Ht 68.0 in | Wt 108.0 lb

## 2017-08-29 DIAGNOSIS — R627 Adult failure to thrive: Secondary | ICD-10-CM | POA: Diagnosis not present

## 2017-08-29 DIAGNOSIS — Z23 Encounter for immunization: Secondary | ICD-10-CM | POA: Diagnosis not present

## 2017-08-29 DIAGNOSIS — N183 Chronic kidney disease, stage 3 unspecified: Secondary | ICD-10-CM

## 2017-08-29 DIAGNOSIS — G894 Chronic pain syndrome: Secondary | ICD-10-CM

## 2017-08-29 DIAGNOSIS — G8929 Other chronic pain: Secondary | ICD-10-CM

## 2017-08-29 DIAGNOSIS — M549 Dorsalgia, unspecified: Secondary | ICD-10-CM | POA: Diagnosis not present

## 2017-08-29 LAB — BASIC METABOLIC PANEL WITH GFR
BUN / CREAT RATIO: 20 (calc) (ref 6–22)
BUN: 40 mg/dL — AB (ref 7–25)
CHLORIDE: 99 mmol/L (ref 98–110)
CO2: 31 mmol/L (ref 20–32)
Calcium: 9.1 mg/dL (ref 8.6–10.3)
Creat: 2.01 mg/dL — ABNORMAL HIGH (ref 0.70–1.11)
GFR, Est African American: 32 mL/min/{1.73_m2} — ABNORMAL LOW (ref >=60)
GFR, Est Non African American: 28 mL/min/{1.73_m2} — ABNORMAL LOW (ref >=60)
Glucose, Bld: 121 mg/dL — ABNORMAL HIGH (ref 65–99)
Potassium: 4.1 mmol/L (ref 3.5–5.3)
SODIUM: 138 mmol/L (ref 135–146)

## 2017-08-29 NOTE — Progress Notes (Signed)
Subjective:    Patient ID: Roberto SchlatterRobert J Day, male    DOB: 02/21/1921, 84 y.o.   MRN: [id number]  HPI Pt is a 84 yo male with failure to thrive and under palliative care.   .. Active Ambulatory Problems    Diagnosis Date Noted  . Hematuria 10/07/2011  . Inguinal hernia, bilateral 10/07/2011  . Unintentional weight loss 10/07/2011  . BPH (benign prostatic hyperplasia) 10/07/2011  . Bladder stone 10/07/2011  . Elevated PSA (6.1 05/2011), (6.5 07/2011) (7.6 10/13) (7.5 5/14) 10/11/2011  . Other and unspecified hyperlipidemia 10/12/2011  . Hyperlipidemia 10/17/2011  . Osteopenia (Femur T= -1.9 11/2011) 10/17/2011  . Chronic back pain 10/17/2011  . Rheumatoid arthritis (per outside records) 10/17/2011  . Glaucoma 10/17/2011  . CKD (chronic kidney disease) stage 3, GFR 30-59 ml/min (HCC) 10/17/2011  . 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 Diarrhea 02/10/2012  . Polypharmacy 02/21/2012  . Diverticulosis of colon without hemorrhage 03/05/2012  . Osteoarthritis of both knees 06/20/2012  . Osteoarthritis of right glenohumeral joint 06/27/2012  . Aortic regurgitation 11/15/2012  . Mitral valve stenosis 11/15/2012  . Pulmonary fibrosis (HCC) 12/10/2012  . Tremor 05/07/2013  . Solitary pulmonary nodule 05/08/2013  . Glaucoma suspect 07/01/2013  . Atherosclerosis of aorta (HCC) 11/08/2013  . Essential tremor 02/18/2014  . Hyperglycemia 05/05/2014  . Allergic rhinitis 07/24/2014  . Abnormality of gait 12/03/2014  . Excess ear wax 02/24/2015  . Epigastric pain 08/06/2015  . Swelling of right lower extremity 10/12/2015  . H/O inguinal hernia repair 10/12/2015  . Encounter for chronic pain management 11/28/2015  . Generalized edema 02/25/2016  . Chronic pain syndrome 03/23/2016  . Drug induced constipation 11/30/2016  . Failure to thrive in adult 03/14/2017  . Chronic right shoulder pain 04/06/2017  . Generalized abdominal pain 04/06/2017  .  Chronic constipation 04/06/2017  . Bladder outlet obstruction 04/19/2017  . Cirrhosis of liver (HCC) 04/19/2017  . Diverticulosis 04/19/2017  . Aortic stenosis 06/02/2017  . Left ankle swelling 06/02/2017   Resolved Ambulatory Problems    Diagnosis Date Noted  . Pulmonary nodule, left 10/17/2011  . UTI (lower urinary tract infection) 10/17/2011  . Pulmonary nodule 11/21/2012  . Vision loss 02/18/2014  . CAP (community acquired pneumonia) 02/12/2015  . Decubitus ulcer of left hip, stage 1 02/25/2016   Past Medical History:  Diagnosis Date  . Arthritis   . Bladder infection   . GERD (gastroesophageal reflux disease)   . Hypertension   . Migraines   . Nitrate poisoning 1945  . Pneumonia   . Stomach ulcer 1945  . Tremors of nervous system    Overall doing well today. Weight is stable at 108 and even up from 3 months ago. Pain is controlled. No SOB, CP, headaches or vision changes.    Review of Systems  All other systems reviewed and are negative.      Objective:   Physical Exam  Constitutional: He is oriented to person, place, and time.  Frail non ambulatory in wheelchair  HENT:  Head: Normocephalic and atraumatic.  Cardiovascular: Normal rate and regular rhythm.  Murmur heard. 4/5 SEM  Pulmonary/Chest: Effort normal and breath sounds normal.  Neurological: He is alert and oriented to person, place, and time.  Psychiatric: He has a normal mood and affect. His behavior is normal.          Assessment & Plan:  Marland Kitchen.Marland Kitchen.Diagnoses and all orders for this visit:  Failure to thrive in adult  CKD (chronic kidney disease) stage 3, GFR 30-59 ml/min (HCC) -     BASIC METABOLIC PANEL WITH GFR  Chronic back pain, unspecified back location, unspecified back pain laterality  Need for tetanus booster -     Tdap vaccine greater than or equal to 7yo IM  Chronic pain syndrome   Placed on waiting list for high dose flu shot. Tdap given today.   Elevated serum creatine at last  visit due to NSAID use. Will recheck today.

## 2017-08-30 NOTE — Progress Notes (Signed)
Call pt: kidney function has not worsened but has also not improved either. This could be his new baseline.

## 2017-09-19 DIAGNOSIS — M19011 Primary osteoarthritis, right shoulder: Secondary | ICD-10-CM | POA: Diagnosis not present

## 2017-09-19 DIAGNOSIS — M19012 Primary osteoarthritis, left shoulder: Secondary | ICD-10-CM | POA: Diagnosis not present

## 2017-09-19 DIAGNOSIS — M1711 Unilateral primary osteoarthritis, right knee: Secondary | ICD-10-CM | POA: Diagnosis not present

## 2017-09-19 DIAGNOSIS — M1712 Unilateral primary osteoarthritis, left knee: Secondary | ICD-10-CM | POA: Diagnosis not present

## 2017-10-25 ENCOUNTER — Telehealth: Payer: Self-pay | Admitting: Physician Assistant

## 2017-10-25 NOTE — Telephone Encounter (Signed)
Ok I will call patient's daughter. Please make his chart deceased.

## 2017-10-25 NOTE — Telephone Encounter (Signed)
Roberto Day called this morning to cancel Fern's appt. She stated that he passed away last evening. She also said that you could call her on her cell, if you would like.

## 2017-11-03 DEATH — deceased

## 2017-11-21 ENCOUNTER — Ambulatory Visit: Payer: Medicare Other | Admitting: Physician Assistant

## 2018-09-19 IMAGING — DX DG SHOULDER 2+V*R*
3 series · 3 of 3 positions shown · non-contrast
Comparison: Chest x-ray of 05/22/2013

CLINICAL DATA: Right shoulder pain for 8 months

EXAM:
RIGHT SHOULDER - 2+ VIEW

[shoulder grashey]
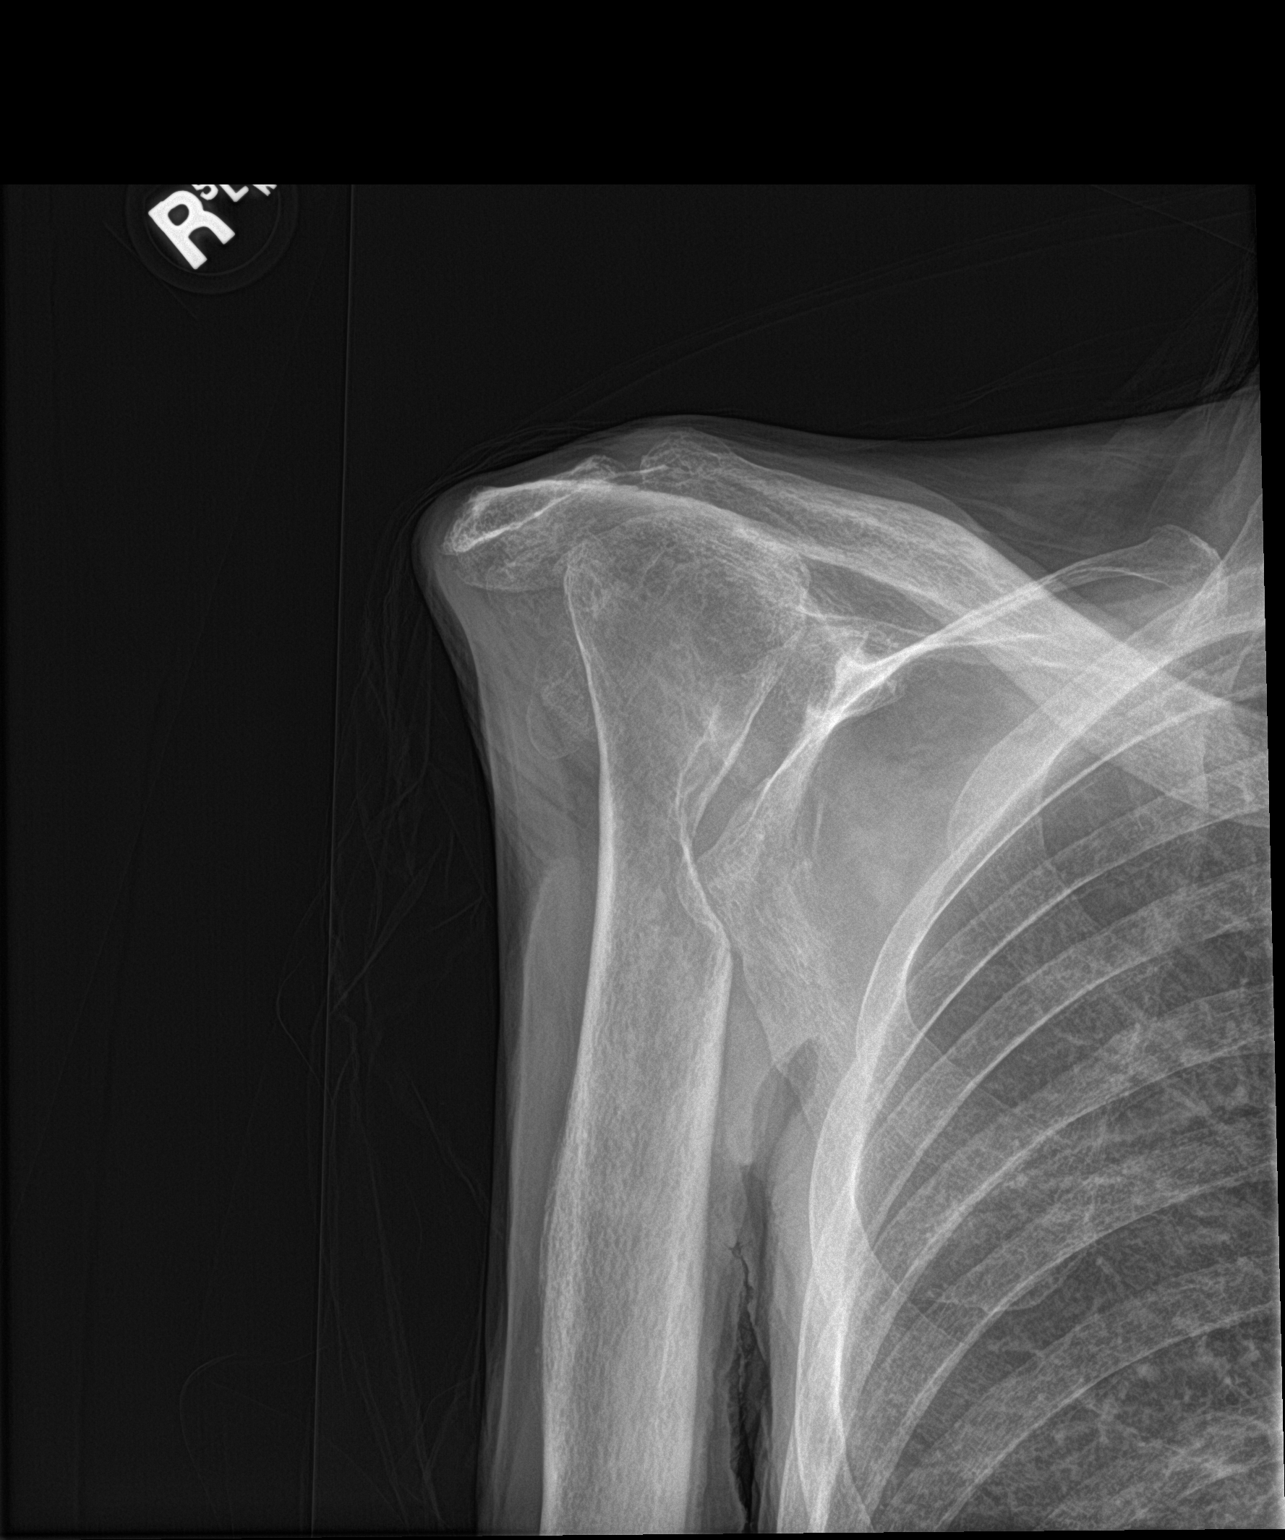

[shoulder y view]
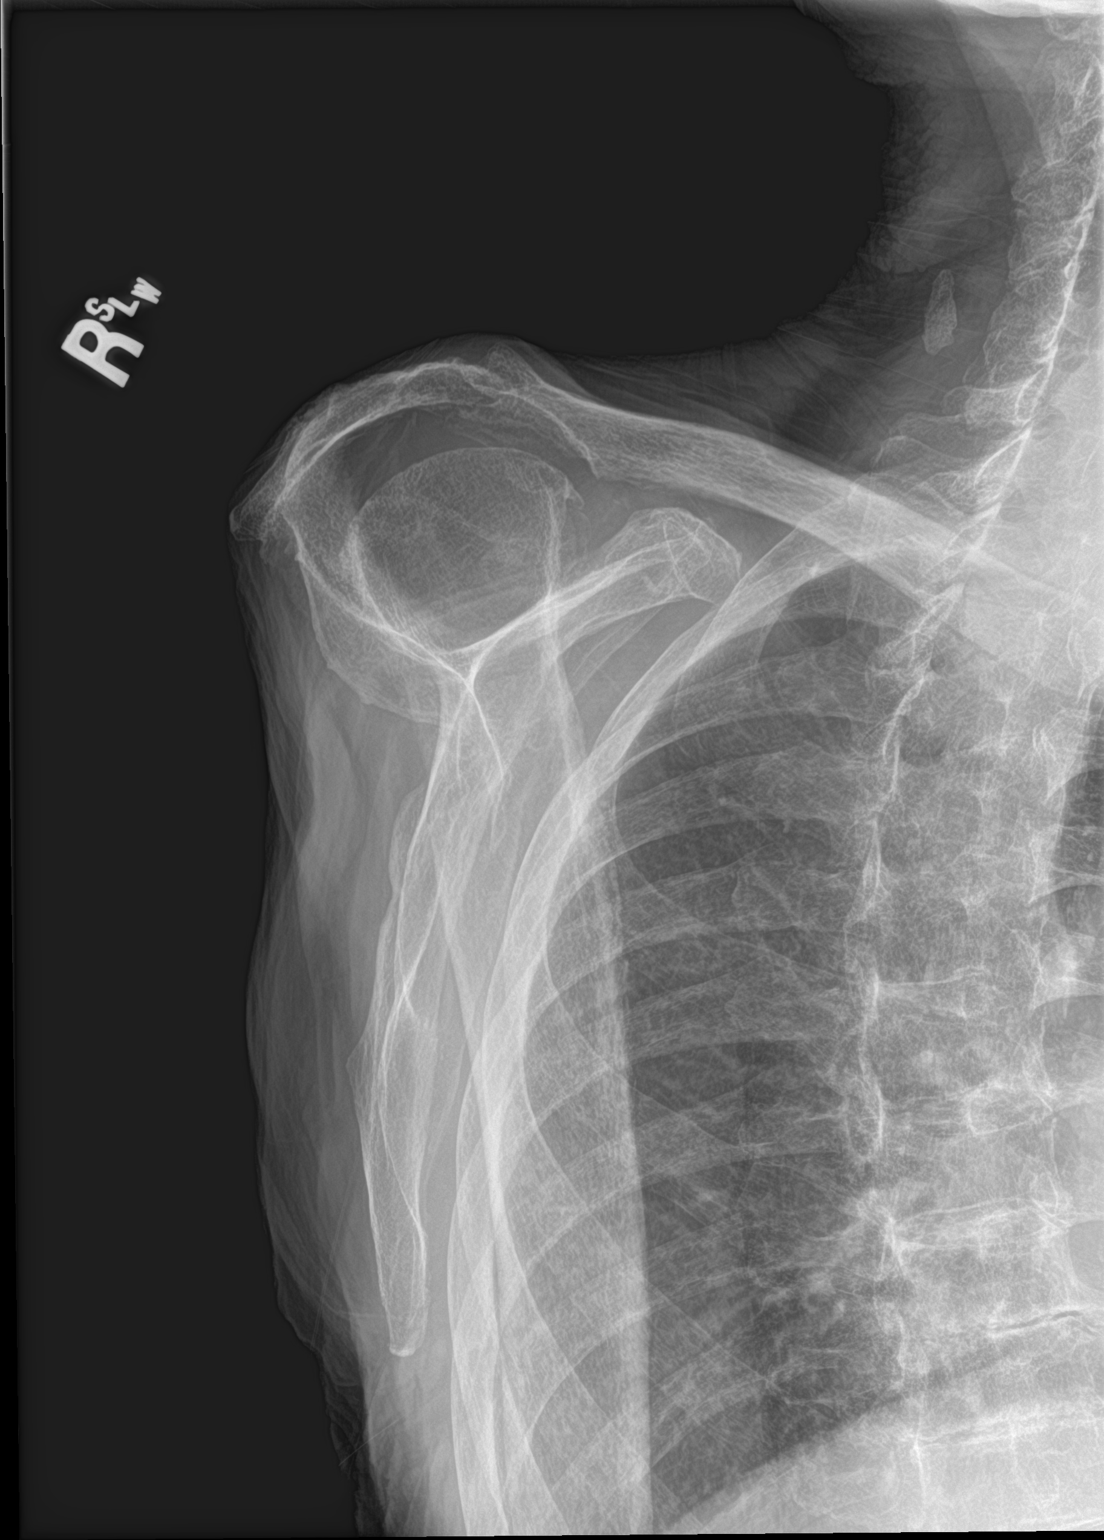

[shoulder ap neutral]
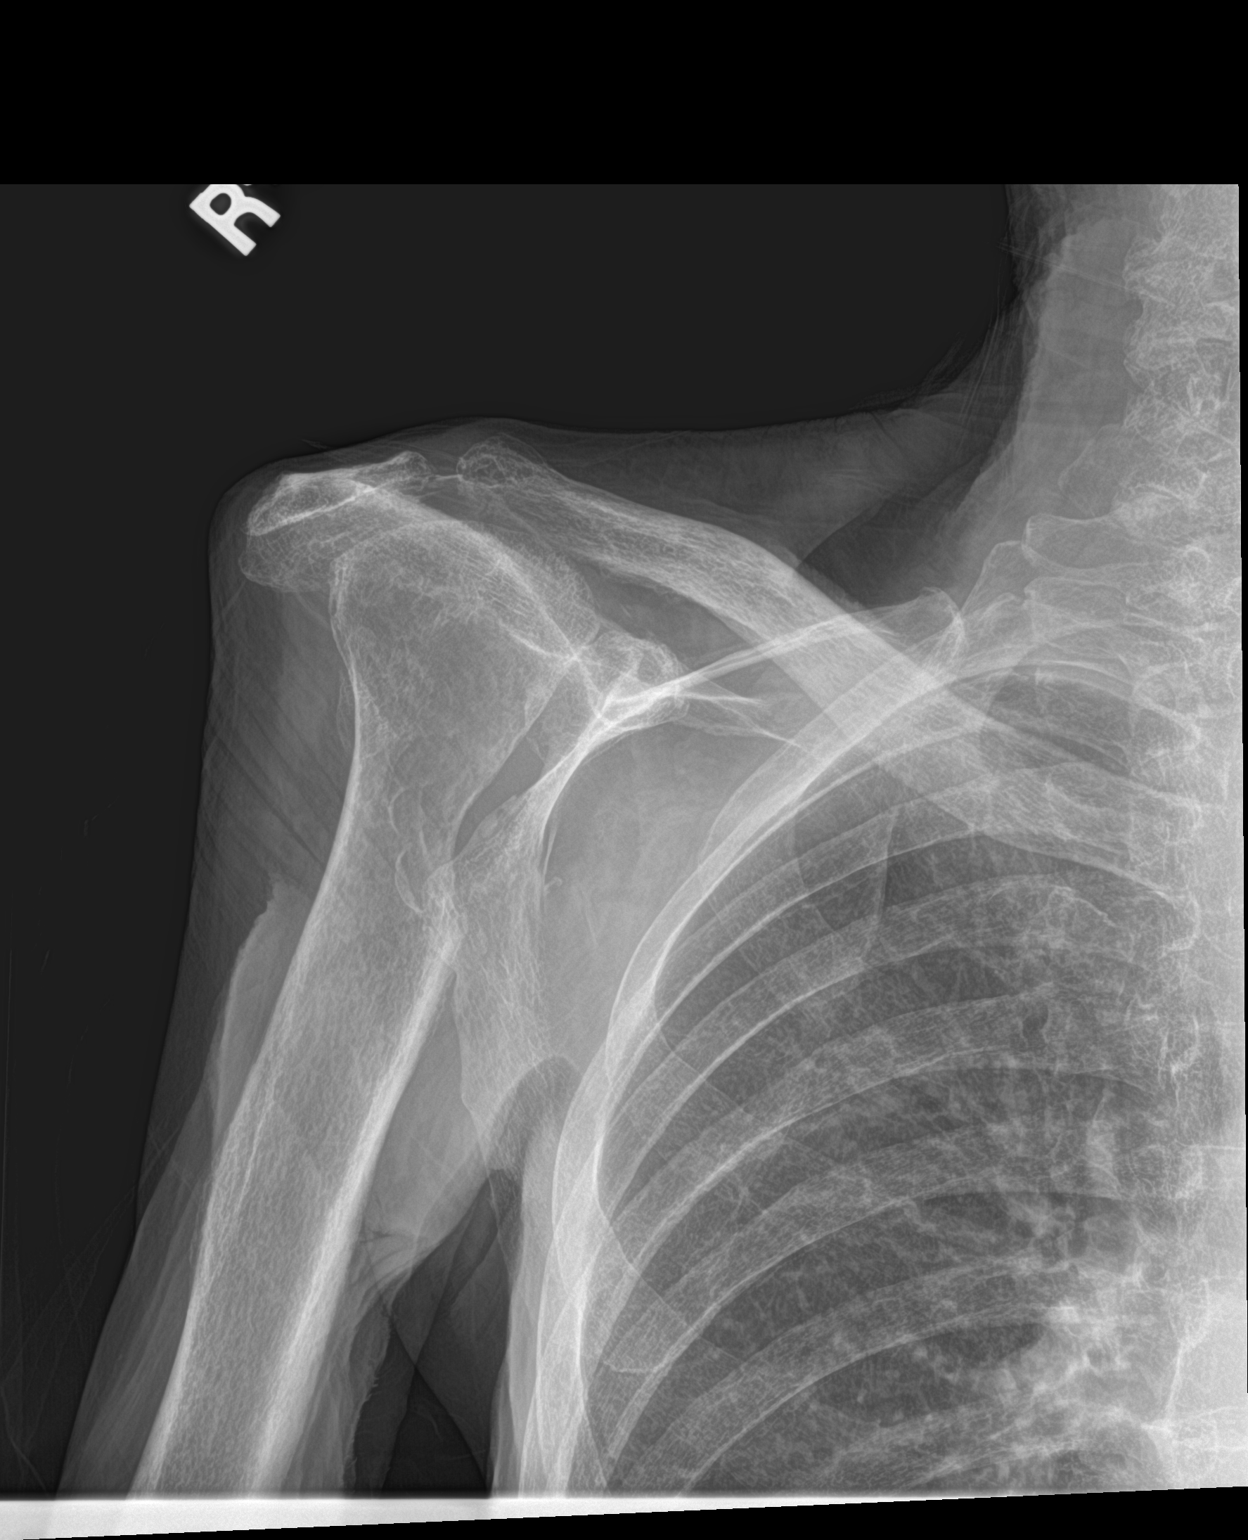

[3 of 3 positions shown; findings below may reference images not displayed]

FINDINGS: There is deformity of the right glenohumeral joint with markedly
widened glenoid rim which appears chronic in nature. There is
narrowing of the subacromial joint space which may lead to
impingement and chronic rotator cuff disease. Indentation of the
inferior glenoid rim upon the proximal right humeral neck is noted
with indentation which is well corticated and old. No acute
abnormality is seen.
IMPRESSION: Similar to the chest x-ray of 7039, there is deformity of the
glenohumeral joint space with widened glenoid fossa and indentation
upon the medial proximal right humeral neck proximal shaft. Somewhat
narrowed subacromial joint space may lead to impingement and chronic
rotator cuff disease
